# Patient Record
Sex: Female | Born: 1955
Health system: Southern US, Community
[De-identification: ages and names within clinical notes are randomized; demographics above are authoritative.]

## PROBLEM LIST (undated history)

## (undated) DIAGNOSIS — M1990 Unspecified osteoarthritis, unspecified site: Secondary | ICD-10-CM

## (undated) DIAGNOSIS — Z86718 Personal history of other venous thrombosis and embolism: Secondary | ICD-10-CM

## (undated) DIAGNOSIS — D649 Anemia, unspecified: Secondary | ICD-10-CM

## (undated) DIAGNOSIS — T7840XA Allergy, unspecified, initial encounter: Secondary | ICD-10-CM

## (undated) DIAGNOSIS — E785 Hyperlipidemia, unspecified: Secondary | ICD-10-CM

## (undated) DIAGNOSIS — I10 Essential (primary) hypertension: Secondary | ICD-10-CM

## (undated) HISTORY — PX: POLYPECTOMY: SHX149

## (undated) HISTORY — DX: Allergy, unspecified, initial encounter: T78.40XA

## (undated) HISTORY — DX: Hyperlipidemia, unspecified: E78.5

## (undated) HISTORY — DX: Unspecified osteoarthritis, unspecified site: M19.90

## (undated) HISTORY — DX: Essential (primary) hypertension: I10

## (undated) HISTORY — PX: COLONOSCOPY: SHX174

## (undated) HISTORY — DX: Anemia, unspecified: D64.9

## (undated) HISTORY — PX: OTHER SURGICAL HISTORY: SHX169

---

## 1991-04-04 DIAGNOSIS — Z86718 Personal history of other venous thrombosis and embolism: Secondary | ICD-10-CM

## 1991-04-04 HISTORY — DX: Personal history of other venous thrombosis and embolism: Z86.718

## 1998-01-06 ENCOUNTER — Ambulatory Visit (HOSPITAL_COMMUNITY): Admission: RE | Admit: 1998-01-06 | Discharge: 1998-01-06 | Payer: Self-pay | Admitting: Internal Medicine

## 1998-07-13 ENCOUNTER — Emergency Department (HOSPITAL_COMMUNITY): Admission: EM | Admit: 1998-07-13 | Discharge: 1998-07-13 | Payer: Self-pay | Admitting: Emergency Medicine

## 2000-06-06 ENCOUNTER — Emergency Department (HOSPITAL_COMMUNITY): Admission: EM | Admit: 2000-06-06 | Discharge: 2000-06-06 | Payer: Self-pay | Admitting: *Deleted

## 2002-10-20 ENCOUNTER — Ambulatory Visit (HOSPITAL_COMMUNITY): Admission: RE | Admit: 2002-10-20 | Discharge: 2002-10-20 | Payer: Self-pay | Admitting: Family Medicine

## 2003-05-25 ENCOUNTER — Emergency Department (HOSPITAL_COMMUNITY): Admission: EM | Admit: 2003-05-25 | Discharge: 2003-05-25 | Payer: Self-pay | Admitting: Emergency Medicine

## 2005-08-30 ENCOUNTER — Emergency Department (HOSPITAL_COMMUNITY): Admission: EM | Admit: 2005-08-30 | Discharge: 2005-08-30 | Payer: Self-pay | Admitting: Emergency Medicine

## 2005-08-30 ENCOUNTER — Ambulatory Visit (HOSPITAL_COMMUNITY): Admission: RE | Admit: 2005-08-30 | Discharge: 2005-08-30 | Payer: Self-pay | Admitting: Emergency Medicine

## 2005-08-30 ENCOUNTER — Encounter: Payer: Self-pay | Admitting: Vascular Surgery

## 2008-03-17 ENCOUNTER — Emergency Department (HOSPITAL_COMMUNITY): Admission: EM | Admit: 2008-03-17 | Discharge: 2008-03-17 | Payer: Self-pay | Admitting: Emergency Medicine

## 2012-06-06 ENCOUNTER — Emergency Department (INDEPENDENT_AMBULATORY_CARE_PROVIDER_SITE_OTHER)
Admission: EM | Admit: 2012-06-06 | Discharge: 2012-06-06 | Disposition: A | Discharge status: Home / Self Care | Payer: Self-pay | Source: Home / Self Care | Attending: Emergency Medicine | Admitting: Emergency Medicine

## 2012-06-06 ENCOUNTER — Observation Stay (HOSPITAL_COMMUNITY): Payer: Self-pay

## 2012-06-06 ENCOUNTER — Emergency Department (HOSPITAL_COMMUNITY): Payer: Self-pay

## 2012-06-06 ENCOUNTER — Observation Stay (HOSPITAL_COMMUNITY)
Admission: EM | Admit: 2012-06-06 | Discharge: 2012-06-07 | Disposition: A | Discharge status: Home / Self Care | Payer: Self-pay | Attending: Internal Medicine | Admitting: Internal Medicine

## 2012-06-06 ENCOUNTER — Encounter (HOSPITAL_COMMUNITY): Payer: Self-pay | Admitting: *Deleted

## 2012-06-06 ENCOUNTER — Encounter (HOSPITAL_COMMUNITY): Payer: Self-pay | Admitting: Emergency Medicine

## 2012-06-06 DIAGNOSIS — R0600 Dyspnea, unspecified: Secondary | ICD-10-CM

## 2012-06-06 DIAGNOSIS — J069 Acute upper respiratory infection, unspecified: Secondary | ICD-10-CM | POA: Insufficient documentation

## 2012-06-06 DIAGNOSIS — R9431 Abnormal electrocardiogram [ECG] [EKG]: Secondary | ICD-10-CM | POA: Diagnosis present

## 2012-06-06 DIAGNOSIS — I10 Essential (primary) hypertension: Secondary | ICD-10-CM

## 2012-06-06 DIAGNOSIS — R002 Palpitations: Secondary | ICD-10-CM

## 2012-06-06 DIAGNOSIS — R61 Generalized hyperhidrosis: Secondary | ICD-10-CM

## 2012-06-06 DIAGNOSIS — R42 Dizziness and giddiness: Secondary | ICD-10-CM

## 2012-06-06 DIAGNOSIS — I160 Hypertensive urgency: Secondary | ICD-10-CM | POA: Diagnosis present

## 2012-06-06 DIAGNOSIS — R531 Weakness: Secondary | ICD-10-CM

## 2012-06-06 DIAGNOSIS — E876 Hypokalemia: Secondary | ICD-10-CM | POA: Insufficient documentation

## 2012-06-06 DIAGNOSIS — F1410 Cocaine abuse, uncomplicated: Secondary | ICD-10-CM | POA: Insufficient documentation

## 2012-06-06 HISTORY — DX: Personal history of other venous thrombosis and embolism: Z86.718

## 2012-06-06 LAB — CBC WITH DIFFERENTIAL/PLATELET
Basophils Relative: 1 % (ref 0–1)
Eosinophils Absolute: 0.2 10*3/uL (ref 0.0–0.7)
HCT: 41.1 % (ref 36.0–46.0)
Hemoglobin: 13.9 g/dL (ref 12.0–15.0)
Lymphs Abs: 1.6 10*3/uL (ref 0.7–4.0)
MCH: 30.7 pg (ref 26.0–34.0)
MCHC: 33.8 g/dL (ref 30.0–36.0)
MCV: 90.7 fL (ref 78.0–100.0)
Monocytes Absolute: 0.3 10*3/uL (ref 0.1–1.0)
Monocytes Relative: 8 % (ref 3–12)
Neutrophils Relative %: 44 % (ref 43–77)
RBC: 4.53 MIL/uL (ref 3.87–5.11)

## 2012-06-06 LAB — PRO B NATRIURETIC PEPTIDE: Pro B Natriuretic peptide (BNP): 211.5 pg/mL — ABNORMAL HIGH (ref 0–125)

## 2012-06-06 LAB — POCT I-STAT, CHEM 8
BUN: 7 mg/dL (ref 6–23)
Chloride: 106 mEq/L (ref 96–112)
Creatinine, Ser: 0.7 mg/dL (ref 0.50–1.10)
Glucose, Bld: 91 mg/dL (ref 70–99)
Potassium: 4 mEq/L (ref 3.5–5.1)
Sodium: 140 mEq/L (ref 135–145)

## 2012-06-06 LAB — APTT: aPTT: 27 seconds (ref 24–37)

## 2012-06-06 LAB — LIPID PANEL
HDL: 47 mg/dL (ref >39)
Total CHOL/HDL Ratio: 6.9 RATIO
Triglycerides: 380 mg/dL — ABNORMAL HIGH (ref <150)

## 2012-06-06 LAB — COMPREHENSIVE METABOLIC PANEL
ALT: 19 U/L (ref 0–35)
AST: 20 U/L (ref 0–37)
Albumin: 3.8 g/dL (ref 3.5–5.2)
CO2: 24 mEq/L (ref 19–32)
Chloride: 104 mEq/L (ref 96–112)
Creatinine, Ser: 0.7 mg/dL (ref 0.50–1.10)
GFR calc non Af Amer: >90 mL/min (ref >90)
Potassium: 3.5 mEq/L (ref 3.5–5.1)
Sodium: 140 mEq/L (ref 135–145)
Total Bilirubin: 0.4 mg/dL (ref 0.3–1.2)

## 2012-06-06 LAB — POCT I-STAT TROPONIN I

## 2012-06-06 MED ORDER — ACETAMINOPHEN 650 MG RE SUPP: 650.0000 mg | Freq: Four times a day (QID) | RECTAL | Status: DC | PRN

## 2012-06-06 MED ORDER — SODIUM CHLORIDE 0.9 % IV SOLN
Freq: Once | INTRAVENOUS | Status: AC
  Administered 2012-06-06: 13:00:00 via INTRAVENOUS

## 2012-06-06 MED ORDER — SODIUM CHLORIDE 0.9 % IV SOLN: 250.0000 mL | INTRAVENOUS | Status: DC | PRN

## 2012-06-06 MED ORDER — LORATADINE 10 MG PO TABS
10.0000 mg | ORAL_TABLET | Freq: Every day | ORAL | Status: DC
  Administered 2012-06-06 – 2012-06-07 (×2): 10 mg via ORAL
  Filled 2012-06-06 (×2): qty 1

## 2012-06-06 MED ORDER — SODIUM CHLORIDE 0.9 % IV SOLN
INTRAVENOUS | Status: DC
  Administered 2012-06-06: 12:00:00 via INTRAVENOUS

## 2012-06-06 MED ORDER — SODIUM CHLORIDE 0.9 % IJ SOLN
3.0000 mL | Freq: Two times a day (BID) | INTRAMUSCULAR | Status: DC
  Administered 2012-06-06 – 2012-06-07 (×2): 3 mL via INTRAVENOUS

## 2012-06-06 MED ORDER — HYDROCHLOROTHIAZIDE 25 MG PO TABS
25.0000 mg | ORAL_TABLET | Freq: Every day | ORAL | Status: DC
  Administered 2012-06-06 – 2012-06-07 (×2): 25 mg via ORAL
  Filled 2012-06-06 (×2): qty 1

## 2012-06-06 MED ORDER — SODIUM CHLORIDE 0.9 % IJ SOLN: 3.0000 mL | Freq: Two times a day (BID) | INTRAMUSCULAR | Status: DC

## 2012-06-06 MED ORDER — LABETALOL HCL 5 MG/ML IV SOLN: 5.0000 mg | INTRAVENOUS | Status: DC | PRN

## 2012-06-06 MED ORDER — ACETAMINOPHEN 325 MG PO TABS
650.0000 mg | ORAL_TABLET | Freq: Four times a day (QID) | ORAL | Status: DC | PRN
  Administered 2012-06-06: 650 mg via ORAL
  Filled 2012-06-06: qty 2

## 2012-06-06 MED ORDER — SODIUM CHLORIDE 0.9 % IJ SOLN: 3.0000 mL | INTRAMUSCULAR | Status: DC | PRN

## 2012-06-06 MED ORDER — ENALAPRILAT 1.25 MG/ML IV SOLN
1.2500 mg | Freq: Once | INTRAVENOUS | Status: AC
  Administered 2012-06-06: 1.25 mg via INTRAVENOUS
  Filled 2012-06-06: qty 1

## 2012-06-06 MED ORDER — ENOXAPARIN SODIUM 40 MG/0.4ML ~~LOC~~ SOLN
40.0000 mg | SUBCUTANEOUS | Status: DC
  Administered 2012-06-06: 40 mg via SUBCUTANEOUS
  Filled 2012-06-06 (×2): qty 0.4

## 2012-06-06 MED ORDER — NICARDIPINE HCL IN NACL 20-0.86 MG/200ML-% IV SOLN
5.0000 mg/h | Freq: Once | INTRAVENOUS | Status: AC
  Administered 2012-06-06: 5 mg/h via INTRAVENOUS
  Filled 2012-06-06: qty 200

## 2012-06-06 NOTE — ED Notes (Signed)
Report called to [facility] charge nurse

## 2012-06-06 NOTE — ED Provider Notes (Signed)
History     CSN: [id number]  Arrival date & time 06/06/12  1152   First MD Initiated Contact with Patient 06/06/12 1154      Chief Complaint  Patient presents with  . Chest Pain  . Hypertension    (Consider location/radiation/quality/duration/timing/severity/associated sxs/prior treatment) HPI Comments: Pt from UC with c/o Htn.  She has had URI sx this week with sinus tenderness, nasal cong and drainage but no f/c.  She has occasional nausea but is able to eat and drink.  Sx are constant, gradually worsening, not associated with swelling, rashes, no CP, no cough, no fever, no diarrhea or dysuria.  She has no cardiac history.  Was seen at the [facility] and directed here for [facility] which was > 200 systolic and high diastolic.  She states that she has never been treated for any medical problems in the past and takes no Rx meds.  She has been using Ibuprofen but no other OTC meds for sinus fullness / pressure.    Patient is a 57 y.o. female presenting with chest pain and hypertension. The history is provided by the patient and a parent.  Chest Pain Hypertension Associated symptoms include chest pain.    Past Medical History  Diagnosis Date  . Hx of blood clots     History reviewed. No pertinent past surgical history.  No family history on file.  History  Substance Use Topics  . Smoking status: Never Smoker   . Smokeless tobacco: Not on file  . Alcohol Use: 1.2 oz/week    2 Cans of beer per week    OB History   Grav Para Term Preterm Abortions TAB SAB Ect Mult Living                  Review of Systems  Cardiovascular: Positive for chest pain.  All other systems reviewed and are negative.    Allergies  Review of patient's allergies indicates no known allergies.  Home Medications   Current Outpatient Rx  Name  Route  Sig  Dispense  Refill  . ibuprofen (ADVIL,MOTRIN) 200 MG tablet   Oral   Take 200 mg by mouth every 6 (six) hours as needed for pain, fever or headache.            BP 145/101  Pulse 84  Temp(Src) 97.7 F (36.5 C) (Oral)  Resp 24  SpO2 98%  Physical Exam  Nursing note and vitals reviewed. Constitutional: She appears well-developed and well-nourished. No distress.  HENT:  Head: Normocephalic and atraumatic.  Mouth/Throat: Oropharynx is clear and moist. No oropharyngeal exudate.  Eyes: Conjunctivae and EOM are normal. Pupils are equal, round, and reactive to light. Right eye exhibits no discharge. Left eye exhibits no discharge. No scleral icterus.  Neck: Normal range of motion. Neck supple. No JVD present. No thyromegaly present.  Cardiovascular: Normal rate, regular rhythm, normal heart sounds and intact distal pulses.  Exam reveals no gallop and no friction rub.   No murmur heard. Pulmonary/Chest: Effort normal and breath sounds normal. No respiratory distress. She has no wheezes. She has no rales.  Abdominal: Soft. Bowel sounds are normal. She exhibits no distension and no mass. There is no tenderness.  Musculoskeletal: Normal range of motion. She exhibits no edema and no tenderness.  Lymphadenopathy:    She has no cervical adenopathy.  Neurological: She is alert. Coordination normal.  Skin: Skin is warm and dry. No rash noted. No erythema.  Psychiatric: She has a normal mood and  affect. Her behavior is normal.    ED Course  Procedures (including critical care time)  Labs Reviewed  APTT  PROTIME-INR  CBC WITH DIFFERENTIAL  POCT I-STAT, CHEM 8   Dg Chest Port 1 View  06/06/2012  *RADIOLOGY REPORT*  Clinical Data: Right-sided chest pain and shortness of breath.  PORTABLE CHEST - 1 VIEW  Comparison: Chest x-ray 03/17/2008.  Findings: Lung volumes are normal.  No consolidative airspace disease.  No pleural effusions.  Mild cephalization of the pulmonary vasculature, without frank pulmonary edema.  Mild cardiomegaly has increased compared to the prior study.  Upper mediastinal contours are within normal limits.  IMPRESSION: 1.   Interval development of mild cardiomegaly with pulmonary venous congestion.   Original Report Authenticated By: Trudie Reed, M.D.      1. Hypertension       MDM  The patient has normal vital signs other than significant hypertension, she will require treatment of his blood pressure as we evaluate her for her palpitations and generalized weakness with upper respiratory symptoms. Again she denies chest pain, has had no vomiting, no diaphoresis, no back pain. I doubt that this is cardiac in nature though she does have hypertension that requires treatment. Labs have been drawn, EKG is unremarkable and unchanged from the nursing care. 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 Workup to ensue.  The patient is not have any ectopy on my exam in order she have any ectopy on her EKG, source of palpitations unclear  ED ECG REPORT  I personally interpreted this EKG   Date: 06/06/2012   Rate: 65  Rhythm: normal sinus rhythm  QRS Axis: left  Intervals: normal  ST/T Wave abnormalities: normal  Conduction Disutrbances:left anterior fascicular block  Narrative Interpretation:   Old EKG Reviewed: Compared with urgent care EKG from March 6 at 10:58 AM, no changes   The patient has been reevaluated, she has persistent significant hypertension, last blood pressure was 195/127 prior to starting Cardene. After Cardizem drip was initiated blood pressure has improved significantly down to 145/100. I have discussed the patient's care with the internal medicine resident who will admit the patient for ongoing treatment of her hypertension.  At this point blood work unremarkable including renal function, PT INR and a chest x-ray with pulmonary venous congestion but no significant bony edema   Vida Roller, MD 06/06/12 1435

## 2012-06-06 NOTE — ED Notes (Signed)
Per EMS - pt coming from urgent care, pt reports she feels her heart fluttering and generalized weakness X few weeks. Pt denies CP/n/v/d/HA. Pt send to ED for further eval because she was hypertensive, at urgent care 204/121. EMS BP 224/120 HR 90. Urgent care started a 20G in right wrist and placed on 2L/min O2.

## 2012-06-06 NOTE — ED Notes (Signed)
No [facility] truck available - [facility] called

## 2012-06-06 NOTE — ED Notes (Signed)
Pt reports a week ago while she was working she started to feel a little dizzy and felt like her heart was fluttering. Pt reports over the past week when she stands up she feels the fluttering feeling, reports it lasts for a few seconds and sometimes returns with walking. Pt denies CP. Pt c/o right lower back/hip pain that also started about a week ago, rates pain at 4/10, pt has taken ibuprofen but didn't get much relief, didn't take anything today. Pt also c/o slight HA but reports it really hasn't bothered her. Skin warm and dry, resp e/u. Pt in nad.

## 2012-06-06 NOTE — ED Notes (Signed)
Pt reports "heart fluttering for the past week with headaches, shortness of breath, and vision changes" admits to drinking on average 2 beers a day

## 2012-06-06 NOTE — ED Notes (Signed)
Pt returned from MRI, will be taken to [facility]W now.

## 2012-06-06 NOTE — H&P (Signed)
Hospital Admission Note Date: 06/06/2012  Patient name: [name] Medical record number: [id number] Date of birth: Mar 04, 1956 Age: 57 y.o. Gender: female PCP: None former health serv   Medical Service: Internal Medicine Attending physician: Dr. Dalphine Handing    1st Contact: Dr. Shirlee Latch Pager: [phone] 2nd Contact: Dr. Everardo Beals Pager:[phone] After 5 pm or weekends: 1st Contact:  Pager: [phone] 2nd Contact:  Pager: [phone]  Chief Complaint: elevated blood pressure   History of Present Illness: 57 y.o PMH DVT presented to Urgent Care with one week history of URI symptoms (sinus tenderness, sinus problems, nasal congestion and drainage, subjective fever, denies chills).  She was taking an over the counter medication without relief (possibly allergy pills).  Incidentally at Urgent Care she was found to have elevated blood pressure (204/121).  EKG with left atrial enlargement.  She was sent from Urgent Care to the ED for further work up. 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  9 days prior to admission are onset of symptoms (most ROS unless otherwise specified).       Meds: Medications Prior to Admission  Medication Sig Dispense Refill  . ibuprofen (ADVIL,MOTRIN) 200 MG tablet Take 200 mg by mouth every 6 (six) hours as needed for pain, fever or headache.        Allergies: Allergies as of 06/06/2012  . (No Known Allergies)   Past Medical History  Diagnosis Date  . Hx of blood clots 1993     previously on Coumadin x 3 months in the past    Past surgical history-denies   Family history-denies   History   Social History  . Marital Status: Single    Spouse Name: N/A    Number of Children: N/A  . Years of Education: N/A   Occupational History  . Not on file.   Social History Main Topics  . Smoking status: Never Smoker   . Smokeless tobacco: Not on file  . Alcohol Use: 1.2 oz/week    2 Cans of beer per week  . Drug Use: No  . Sexually Active: Not Currently   Other Topics Concern  . Not on file   Social History Narrative  . No narrative on file   Social History:  5 kids Works at [employer] difficulties  Drinks beer 3x per week 24 oz of beer. Denies other drugs or cigarettes  No PCP currently   Review of Systems: General: +subjective fever, denies chills, +rhinorrhea, +sinus congestion, +generalized weakness, +fatigue, +sweating HEENT: resolved h/a CV: +palpitations x 2 weeks, denies chest pain  Lungs: +sob with exertion; +cough with blood x 1 last week Abdomen: +nausea after eating, denies abdominal pain GU: denies dysuria MSK: +right lower back pain x 2 weeks  Extremities: denies lower extremity swelling Neuro: +lightheadedness, +blurry vision and double vision, +dizziness x 2 weeks, resolved h/a (bitemporal), denies falls  Physical Exam: Blood pressure 178/108, pulse 64, temperature 97.9 F (36.6 C), temperature source Oral, resp. rate 18, SpO2 100.00%.  VS 87/100%/18/137/85; [id number]  General: lying in bed, nad, alert and oriented x 3  HEENT: [place]/at,  perrl b/l CV: RRR no rubs, murmurs or gallops Lungs: ctab Abdomen: soft, obese, ntnd Extremities: warm, no cyanosis or edema  Neuro: CN 2-12 grossly intact, 5/5 motor strength all 4 extremities, intact finger to nose those had double vision, negative rhomberg, intact heal to shin, wnl reflexes patella and BR  Lab results: Basic Metabolic Panel:  Recent Labs  65/78/46 1313  NA 140   K 4.0  CL 106  GLUCOSE 91  BUN 7  CREATININE 0.70   CBC:  Recent Labs  06/06/12 1313 06/06/12 1401  WBC  --  3.8*  NEUTROABS  --  1.7  HGB 15.0 13.9  HCT 44.0 41.1  MCV  --  90.7  PLT  --  174   Coagulation:  Recent Labs  06/06/12 1248  LABPROT 13.1  INR 1.00   Urine Drug Screen: Drugs of Abuse  No results found for this basename: labopia,  cocainscrnur,  labbenz,  amphetmu,  thcu,  labbarb     Misc. Labs: UDS Cardiac enzymes  proBNP Hemoglobin A1C   Imaging results:  [name] Contrast  06/06/2012  *RADIOLOGY REPORT*  Clinical Data: Weakness and dizziness.  Heart palpitations. Question posterior circulation infarct.  MRI HEAD WITHOUT CONTRAST  Technique:  Multiplanar, multiecho pulse sequences of the brain and surrounding structures were obtained according to standard protocol without intravenous contrast.  Comparison: None.  Findings: The diffusion weighted images demonstrate no evidence for acute or subacute infarction.  Incidental note is made of a persistent cavum septum pellucidum et vergi.  Scattered periventricular subcortical T2 and FLAIR hyperintensities are greater than expected for age.  No hemorrhage or mass lesion is present.  Flow is present in the major intracranial arteries.  The globes and orbits are intact.  Mild circumferential mucosal thickening is present bilaterally in the maxillary sinuses. Minimal mucosal thickening is noted in the ethmoid air cells bilaterally.  The mastoid air cells are clear.  IMPRESSION:  1.  Scattered periventricular subcortical T2 and FLAIR hyperintensities are greater than expected for age. The finding is nonspecific but can be seen in the setting of chronic microvascular ischemia, a demyelinating process such as multiple sclerosis, vasculitis, complicated migraine headaches, or as the sequelae of a prior infectious or inflammatory process. 2.  Mild maxillary and ethmoid sinus disease.   Original Report Authenticated By:  Marin Roberts, M.D.    Dg Chest Port 1 View  06/06/2012  *RADIOLOGY REPORT*  Clinical Data: Right-sided chest pain and shortness of breath.  PORTABLE CHEST - 1 VIEW  Comparison: Chest x-ray 03/17/2008.  Findings: Lung volumes are normal.  No consolidative airspace disease.  No pleural effusions.  Mild cephalization of the pulmonary vasculature, without frank pulmonary edema.  Mild cardiomegaly has increased compared to the prior study.  Upper mediastinal contours are within normal limits.  IMPRESSION: 1.  Interval development of mild cardiomegaly with pulmonary venous congestion.   Original Report Authenticated By: Trudie Reed, M.D.     Other results: EKG: rate 66.  normal sinus rhythm with artifact, normal intervals, borderline LAD, no acute ST changes, no LVH  Assessment & Plan by Problem: 57 yo woman without known significant past medical history who presented with URI and found to be hypertensive on 06/06/12 204/121 at Urgent Care.    1. Concern for Hypertensive Urgency -Blood pressure not well controlled in the ED on Cardene gtt and with Vasotec 1.25 mg x 1 -On admission systolic pressures down to 130s-140s.   -etiology may be her acute  illness.  No signs of end organ damage (nonfocal neuro exam, normal Cr function). Patient likely has longstanding HTN and has not seen a PCP in years.  -Start HCTZ 25mg  daily, Labetalol 5mg  prn BP>200/100  -Risk Stratification: HbA1c, Lipid profile  -Check pBNP, CXR suggests pulmonary venous congestion, trend troponin, UDS, pending echo, CMET in the am to monitor renal and liver function -monitor via tele, pending orthostatics  -spoke with Dr. Roseanne Reno (Neurology) on call he recommended MRI w/o contrast to w/u for posterior circulation infarction  2. Acute URI -Most likely viral etiology, especially in setting of mild leukopenia -Will hold antibiotics for now and monitor. Claritin.  For nasal congestion can't afford Flonase so will not start   -Patient has been afebrile.  -AM CBC   3. QT prolongation  -QTC 482  -repeat EKG in the am   4. DVT px  -Lovenox  5. F/E/N -NS KVO -will monitor electrolytes and replace as needed  -carb mod diet    Dispo: Disposition is deferred at this time, awaiting improvement of current medical problems. Anticipated discharge in approximately 2-3 day(s).   The patient does not have a current PCP therefore will be requiring OPC follow-up after discharge.   The patient does not have transportation limitations that hinder transportation to clinic appointments.  SignedAnnett Gula [phone] 06/06/2012, 7:47 PM

## 2012-06-06 NOTE — ED Provider Notes (Signed)
Chief Complaint  Patient presents with  . Chest Pain    History of Present Illness:   [name] is a 57 year old female who's had mild upper respiratory symptoms for the past week. Also for the past week she has noted heart palpitations with a sensation of fluttering, generalized weakness, malaise, and fatigue, shortness of breath, sweats, dizziness, and poor vision. She's had some moderate headaches. She denies any other focal neurological symptoms. She has no anterior chest pain. She has had some mid back pain. She denies any cardiac history or history of high blood pressure. She has not seen a physician in years and does not have a primary care Dr. She takes no prescription medication. She drinks an occasional beer. She's not a smoker.  Review of Systems:  Other than noted above, the patient denies any of the following symptoms. Systemic:  No fever, chills, or fatigue. Pulmonary:  No cough, wheezing, shortness of breath. Cardiac:  No chest pain, tightness, pressure, dizziness, presyncope, syncope, PND, orthopnea, or edema. Ext:  No leg pain or swelling. Neuro:  No weakness, paresthesias, or difficulty with speech or gait. Psych:  No anxiety or depression. Endo:  No weight loss, tremor, sweats, or heat intolerance.   PMFSH:  Past medical history, family history, social history, meds, and allergies were reviewed and updated as needed. No history of cardiac disease.  No history of excessive alcohol intake.  Physical Exam:   Vital signs:  BP 204/121  Pulse 73  Temp(Src) 97.5 F (36.4 C) (Oral)  Resp 18  SpO2 100% Gen:  Alert, oriented, in no distress, skin warm and dry. Eye:  PERRL, lids and conjunctivas normal.  No stare or lid lag. ENT:  Mucous membranes moist, pharynx clear. Neck:  Supple, no adenopathy or tenderness.  No JVD.  Thyroid not enlarged. Lungs:  Clear to auscultation, no wheezes, rales or rhonchi.  No respiratory distress. Heart:  Regular rhythm, no extrasystoles.  No  gallops, murmers, clicks or rubs. Abdomen:  Soft, nontender, no organomegaly or mass.  Bowel sounds normal.  No pulsatile abdominal mass or bruit. Ext:  No edema. Pulses full and equal. Skin:  Warm and dry.  No rash.  EKG:   Date: 06/06/2012  Rate: 65  Rhythm: normal sinus rhythm  QRS Axis: normal  Intervals: normal  ST/T Wave abnormalities: normal  Conduction Disutrbances:none  Narrative Interpretation: Normal sinus rhythm, possible left atrial enlargement, otherwise normal.  Old EKG Reviewed: none available  Course in Urgent Care Center:   IV normal saline was started at 50 mL per hour, she was begun on nasal oxygen and monitored.  Assessment:  The primary encounter diagnosis was Palpitations. Diagnoses of Weakness, Dyspnea, Diaphoresis, Dizziness, and Hypertension were also pertinent to this visit.  My concern is that her symptoms could be anginal equivalents especially with shortness of breath and the diaphoresis. Additionally her blood pressure is markedly elevated and this only to be controlled prior to sending her home. It is for this reason I have elected to send her to the emergency department via [facility].  Plan:   1.  The following meds were prescribed:   New Prescriptions   No medications on file   2.  The patient was transferred to the emergency department via [facility] in stable condition.  Medical Decision Making:  57 year old female presents with a 1 week history of URI symptoms, heart palpitations, generalized weakness, shortness of breath, sweats, dizziness, poor vision, and headache.  Her BP was markedly elevated  at 204/121.  Her EKG was WNL except for left atrial enlargement.  I am concerned that this may be an anginal equivalent and feel she needs further cardiac workup in the ED.      Reuben Likes, MD 06/06/12 1128

## 2012-06-07 DIAGNOSIS — J069 Acute upper respiratory infection, unspecified: Secondary | ICD-10-CM

## 2012-06-07 DIAGNOSIS — F1410 Cocaine abuse, uncomplicated: Secondary | ICD-10-CM | POA: Diagnosis present

## 2012-06-07 DIAGNOSIS — I369 Nonrheumatic tricuspid valve disorder, unspecified: Secondary | ICD-10-CM

## 2012-06-07 LAB — HEMOGLOBIN A1C: Mean Plasma Glucose: 117 mg/dL — ABNORMAL HIGH (ref <117)

## 2012-06-07 LAB — RAPID URINE DRUG SCREEN, HOSP PERFORMED
Amphetamines: NOT DETECTED
Barbiturates: NOT DETECTED
Tetrahydrocannabinol: NOT DETECTED

## 2012-06-07 LAB — CBC
HCT: 41.8 % (ref 36.0–46.0)
Hemoglobin: 14.5 g/dL (ref 12.0–15.0)
MCH: 31.2 pg (ref 26.0–34.0)
MCHC: 34.7 g/dL (ref 30.0–36.0)
RDW: 12.5 % (ref 11.5–15.5)

## 2012-06-07 LAB — BASIC METABOLIC PANEL
BUN: 10 mg/dL (ref 6–23)
Chloride: 98 mEq/L (ref 96–112)
Creatinine, Ser: 0.68 mg/dL (ref 0.50–1.10)
GFR calc Af Amer: >90 mL/min (ref >90)
GFR calc non Af Amer: >90 mL/min (ref >90)
Glucose, Bld: 96 mg/dL (ref 70–99)
Potassium: 3.4 mEq/L — ABNORMAL LOW (ref 3.5–5.1)

## 2012-06-07 MED ORDER — SALINE SPRAY 0.65 % NA SOLN
1.0000 | NASAL | Status: DC | PRN
  Filled 2012-06-07: qty 44

## 2012-06-07 MED ORDER — LISINOPRIL-HYDROCHLOROTHIAZIDE 20-25 MG PO TABS: 1.0000 | ORAL_TABLET | Freq: Every day | ORAL | Status: DC

## 2012-06-07 MED ORDER — PRAVASTATIN SODIUM 40 MG PO TABS: 40.0000 mg | ORAL_TABLET | Freq: Every day | ORAL | Status: DC

## 2012-06-07 MED ORDER — LISINOPRIL 20 MG PO TABS
20.0000 mg | ORAL_TABLET | Freq: Every day | ORAL | Status: DC
  Administered 2012-06-07: 20 mg via ORAL
  Filled 2012-06-07: qty 1

## 2012-06-07 MED ORDER — SIMVASTATIN 20 MG PO TABS
20.0000 mg | ORAL_TABLET | Freq: Every day | ORAL | Status: DC
  Filled 2012-06-07: qty 1

## 2012-06-07 MED ORDER — POTASSIUM CHLORIDE CRYS ER 20 MEQ PO TBCR
40.0000 meq | EXTENDED_RELEASE_TABLET | Freq: Once | ORAL | Status: AC
  Administered 2012-06-07: 40 meq via ORAL
  Filled 2012-06-07: qty 2

## 2012-06-07 NOTE — Progress Notes (Signed)
Utilization review completed. Bertha Stanfill, RN, BSN. 

## 2012-06-07 NOTE — Care Management Note (Signed)
   CARE MANAGEMENT NOTE 06/07/2012  Patient:  [name], [name]   Account Number:  [id number]  Date Initiated:  06/07/2012  Documentation initiated by:  Johny Shock  Subjective/Objective Assessment:   Calll from pt RN re possible assistance with medications.     Action/Plan:   Pt meds are $4 at [facility] and pt has been filling prescriptions at that provider. MATCH program would also require $3 copay. Therefore pt advise to continue current plan as MATCH program may be needed later for more expensive medications.   Anticipated DC Date:  06/07/2012   Anticipated DC Plan:  HOME/SELF CARE         Choice offered to / List presented to:             Status of service:  Completed, signed off Medicare Important Message given?   (If response is "NO", the following Medicare IM given date fields will be blank) Date Medicare IM given:   Date Additional Medicare IM given:    Discharge Disposition:  HOME/SELF CARE  Per UR Regulation:    If discussed at Long Length of Stay Meetings, dates discussed:    Comments:

## 2012-06-07 NOTE — H&P (Addendum)
Internal Medicine Teaching Service Attending Note Date: 06/07/2012  Patient name: [name]  Medical record number: [id number]  Date of birth: 26-Oct-1955   History: The patient, [name], is a 57 y.o. year old female, with past medical history of blood clots, who denies having any medical problems otherwise, who comes in with the chief  complaint of elevated blood pressure. I have read the history documented by Dr.McLean and I concur with the chronology of events.   When I met with the patient today, she appeared to have greatly improved from last night. She denied dizziness, fatigue, headache, palpitations, chest pain, shortness of breath, sweating, or vision problems. She felt generally well.   [name] medical history, social history and medications have been reviewed.   Review of systems as per HPI and resident note.   Vitals:   06/07/12 0948 06/07/12 1100 06/07/12 1101 06/07/12 1102  BP: 150/89 117/78 66/37 146/102  Pulse: 79 75 89 92   Exam:  I met with patient around 9 am today  General: Moving around in the room comfortably.  HEENT: PERRL, EOMI, no scleral icterus. Heart: RRR, no rubs, murmurs or gallops. Lungs: Clear to auscultation bilaterally, no wheezes, rales, or rhonchi. Abdomen: Soft, nontender, nondistended, BS present. Extremities: Warm, no pedal edema. Neuro: Alert and oriented X3, cranial nerves II-XII grossly intact,  strength and sensation to light touch equal in bilateral upper and lower extremities  Labs:  CMP   Recent Labs Lab 06/06/12 1313 06/06/12 2006 06/07/12 0655  NA 140 140 137  K 4.0 3.5 3.4*  CL 106 104 98  CO2  --  24 27  GLUCOSE 91 121* 96  BUN 7 8 10   CREATININE 0.70 0.70 0.68  CALCIUM  --  9.1 9.3    Recent Labs Lab 06/06/12 1248 06/06/12 2006  AST  --  20  ALT  --  19  ALKPHOS  --  52  BILITOT  --  0.4  PROT  --  7.9  ALBUMIN  --  3.8  INR 1.00  --    CBC  Recent Labs Lab 06/06/12 1313 06/06/12 1401 06/07/12 0655   HGB 15.0 13.9 14.5  HCT 44.0 41.1 41.8  WBC  --  3.8* 4.4  PLT  --  174 181    Recent Labs Lab 06/06/12 2006 06/07/12 0035 06/07/12 0655  TROPONINI <0.30 <0.30 <0.30    Recent Labs Lab 06/06/12 2006  PROBNP 211.5*   Drugs of Abuse     Component Value Date/Time   LABOPIA NONE DETECTED 06/07/2012 1100   COCAINSCRNUR POSITIVE* 06/07/2012 1100   LABBENZ NONE DETECTED 06/07/2012 1100   AMPHETMU NONE DETECTED 06/07/2012 1100   THCU NONE DETECTED 06/07/2012 1100   LABBARB NONE DETECTED 06/07/2012 1100    Imaging: [name] Contrast  06/06/2012  *RADIOLOGY REPORT*  Clinical Data: Weakness and dizziness.  Heart palpitations. Question posterior circulation infarct.  MRI HEAD WITHOUT CONTRAST  Technique:  Multiplanar, multiecho pulse sequences of the brain and surrounding structures were obtained according to standard protocol without intravenous contrast.  Comparison: None.  Findings: The diffusion weighted images demonstrate no evidence for acute or subacute infarction.  Incidental note is made of a persistent cavum septum pellucidum et vergi.  Scattered periventricular subcortical T2 and FLAIR hyperintensities are greater than expected for age.  No hemorrhage or mass lesion is present.  Flow is present in the major intracranial arteries.  The globes and orbits are intact.  Mild circumferential mucosal thickening is  present bilaterally in the maxillary sinuses. Minimal mucosal thickening is noted in the ethmoid air cells bilaterally.  The mastoid air cells are clear.  IMPRESSION:  1.  Scattered periventricular subcortical T2 and FLAIR hyperintensities are greater than expected for age. The finding is nonspecific but can be seen in the setting of chronic microvascular ischemia, a demyelinating process such as multiple sclerosis, vasculitis, complicated migraine headaches, or as the sequelae of a prior infectious or inflammatory process. 2.  Mild maxillary and ethmoid sinus disease.   Original Report  Authenticated By: Marin Roberts, M.D.    Dg Chest Port 1 View  06/06/2012  *RADIOLOGY REPORT*  Clinical Data: Right-sided chest pain and shortness of breath.  PORTABLE CHEST - 1 VIEW  Comparison: Chest x-ray 03/17/2008.  Findings: Lung volumes are normal.  No consolidative airspace disease.  No pleural effusions.  Mild cephalization of the pulmonary vasculature, without frank pulmonary edema.  Mild cardiomegaly has increased compared to the prior study.  Upper mediastinal contours are within normal limits.  IMPRESSION: 1.  Interval development of mild cardiomegaly with pulmonary venous congestion.   Original Report Authenticated By: Trudie Reed, M.D.    EKG 06/07/12 12:02 PM: NSR at 68  ECHO: Study Conclusions  - Left ventricle: Inferobasal hypokinesis The cavity size was mildly dilated. There was moderate concentric hypertrophy. Systolic function was normal. The estimated ejection fraction was in the range of 50% to 55%. - Left atrium: The atrium was mildly dilated. - Atrial septum: No defect or patent foramen ovale was identified.    ASSESSMENT AND PLAN   This is a 57 y.o. year old female with the following problems: 1- Hypertensive Urgency probably secondary to cocaine use 2- Hypokalemia 3- No insurance and no PCP 4- Drug use 5- QT prolongation, resolved.  I agree with the plan of the resident team with the following additions/thoughts:  I think the patient is doing well on HCTZ and the IV meds administered yesterday. However, we need to start the patient on at least 2 anti-hypertensive medications at this point in time. We will start Lisinopril 20 mg, this morning (as already discussed in rounds) and see how the patient does on this combination, so she could be discharged on it. The Lisinopril-HCTZ combination for this strength is a $4 medication in [facility]. Patient will be counseled regarding cocaine use. A1c is 5.7, so no diabetes so far. We will try to get her in [facility] for regular follow up as outpatient. For QTc prolongation, we followed her and her last EKG was normal. ECHO does not reveal any alarming findings in the setting of the current situation. Cardiac enzymes are negative.    Other chronic issues per resident note.    Thanks, Aletta Edouard, MD 3/7/201412:26 PM

## 2012-06-07 NOTE — Progress Notes (Addendum)
Subjective: Pt denies dizziness or lightheadedness.  She had a h/a last night which Tylenol helped.  Denies chest pain, denies abdominal pain.  She ate this am and felt nauseated the nausea now resolved.   Objective: Vital signs in last 24 hours: Filed Vitals:   06/06/12 2106 06/06/12 2109 06/07/12 0614 06/07/12 0948  BP: 160/109 174/117 142/92 150/89  Pulse: 77 78 67 79  Temp:   98.4 F (36.9 C)   TempSrc:   Oral   Resp: 20 20 20    Weight:   197 lb (89.359 kg)   SpO2: 97% 99% 99%    Weight change:  No intake or output data in the 24 hours ending 06/07/12 1003  General: lying in bed, nad, alert and oriented x 3  HEENT: [place]/at CV: RRR no rubs, murmurs or gallops  Lungs: ctab  Abdomen: soft, obese, ntnd  Extremities: warm, no cyanosis or edema  Neuro: CN 2-12 grossly intact, 5/5 motor strength all 4 extremities  Lab Results: Basic Metabolic Panel:  Recent Labs Lab 06/06/12 2006 06/07/12 0655  NA 140 137  K 3.5 3.4*  CL 104 98  CO2 24 27  GLUCOSE 121* 96  BUN 8 10  CREATININE 0.70 0.68  CALCIUM 9.1 9.3   Liver Function Tests:  Recent Labs Lab 06/06/12 2006  AST 20  ALT 19  ALKPHOS 52  BILITOT 0.4  PROT 7.9  ALBUMIN 3.8   CBC:  Recent Labs Lab 06/06/12 1313 06/06/12 1401 06/07/12 0655  WBC  --  3.8* 4.4  NEUTROABS  --  1.7  --   HGB 15.0 13.9 14.5  HCT 44.0 41.1 41.8  MCV  --  90.7 89.9  PLT  --  174 181   Cardiac Enzymes:  Recent Labs Lab 06/06/12 2006 06/07/12 0035 06/07/12 0655  TROPONINI <0.30 <0.30 <0.30   BNP:  Recent Labs Lab 06/06/12 2006  PROBNP 211.5*   Hemoglobin A1C:  Recent Labs Lab 06/06/12 2006  HGBA1C 5.7*   Fasting Lipid Panel:  Recent Labs Lab 06/06/12 2005  CHOL 322*  HDL 47  LDLCALC 199*  TRIG 380*  CHOLHDL 6.9   Coagulation:  Recent Labs Lab 06/06/12 1248  LABPROT 13.1  INR 1.00   Urine Drug Screen: Drugs of Abuse  No results found for this basename: labopia,  cocainscrnur,  labbenz,   amphetmu,  thcu,  labbarb    Misc. Labs: None   Studies/Results: [name] Contrast  06/06/2012  *RADIOLOGY REPORT*  Clinical Data: Weakness and dizziness.  Heart palpitations. Question posterior circulation infarct.  MRI HEAD WITHOUT CONTRAST  Technique:  Multiplanar, multiecho pulse sequences of the brain and surrounding structures were obtained according to standard protocol without intravenous contrast.  Comparison: None.  Findings: The diffusion weighted images demonstrate no evidence for acute or subacute infarction.  Incidental note is made of a persistent cavum septum pellucidum et vergi.  Scattered periventricular subcortical T2 and FLAIR hyperintensities are greater than expected for age.  No hemorrhage or mass lesion is present.  Flow is present in the major intracranial arteries.  The globes and orbits are intact.  Mild circumferential mucosal thickening is present bilaterally in the maxillary sinuses. Minimal mucosal thickening is noted in the ethmoid air cells bilaterally.  The mastoid air cells are clear.  IMPRESSION:  1.  Scattered periventricular subcortical T2 and FLAIR hyperintensities are greater than expected for age. The finding is nonspecific but can be seen in the setting of chronic microvascular ischemia, a demyelinating  process such as multiple sclerosis, vasculitis, complicated migraine headaches, or as the sequelae of a prior infectious or inflammatory process. 2.  Mild maxillary and ethmoid sinus disease.   Original Report Authenticated By: Marin Roberts, M.D.    Dg Chest Port 1 View  06/06/2012  *RADIOLOGY REPORT*  Clinical Data: Right-sided chest pain and shortness of breath.  PORTABLE CHEST - 1 VIEW  Comparison: Chest x-ray 03/17/2008.  Findings: Lung volumes are normal.  No consolidative airspace disease.  No pleural effusions.  Mild cephalization of the pulmonary vasculature, without frank pulmonary edema.  Mild cardiomegaly has increased compared to the prior study.   Upper mediastinal contours are within normal limits.  IMPRESSION: 1.  Interval development of mild cardiomegaly with pulmonary venous congestion.   Original Report Authenticated By: Trudie Reed, M.D.    Medications:  Scheduled Meds: . enoxaparin (LOVENOX) injection  40 mg Subcutaneous Q24H  . hydrochlorothiazide  25 mg Oral Daily  . loratadine  10 mg Oral Daily  . potassium chloride  40 mEq Oral Once  . simvastatin  20 mg Oral q1800  . sodium chloride  3 mL Intravenous Q12H  . sodium chloride  3 mL Intravenous Q12H   Continuous Infusions:  PRN Meds:.sodium chloride, acetaminophen, acetaminophen, labetalol, sodium chloride, sodium chloride  3/7 echo Study Conclusions - Left ventricle: Inferobasal hypokinesis The cavity size was mildly dilated. There was moderate concentric hypertrophy. Systolic function was normal. The estimated ejection fraction was in the range of 50% to 55%. - Left atrium: The atrium was mildly dilated. - Atrial septum: No defect or patent foramen ovale was identified.    Assessment/Plan: 57 yo woman without known significant past medical history who presented with URI and found to be hypertensive on 06/06/12 204/121 at Urgent Care.   1. Concern for Hypertensive Urgency  -blood pressure trending down 142/92 this am  -Started HCTZ 25mg  daily, Added Lisinopril 10 mg Labetalol 5mg  prn BP>200/100  -Risk Stratification: HbA1c 5.7 %, Lipid profile (TC 322, TG 380, HDL 47, LDL 199) -will add Pravastatin 40 mg at discharge and Zocor 20 mg today.  Consider diet and exercise modifications as well  -elevated pBNP 211.5 -see echo above -trend troponin (negative x 3), UDS -monitor via tele, pending orthostatics    2. Acute URI  -Most likely viral etiology, especially in setting of mild leukopenia  -Will hold antibiotics for now and monitor. Claritin. For nasal congestion can't afford Flonase so will not start -Rx [facility]  -Patient has been afebrile.   3. QT  prolongation  -QTC 482-->448  4. DVT px  -Lovenox   5. Cocaine abuse -may have contributed to her elevated BP acutely -given information about cessation   6. F/E/N  -NS KVO  -will monitor electrolytes and replace as needed (HypoK 3.4 replaced with K dur 40 meQ x 1)  -carb mod diet   Dispo: possibly today with outpatient f/u with [facility] in 1 week (new patient)   The patient does not have a current PCP therefore will be requiring [facility]PC follow-up after discharge.  The patient does not have transportation limitations that hinder transportation to clinic appointments.    .Services Needed at time of discharge: Y = Yes, Blank = No PT:   OT:   RN:   Equipment:   Other:     LOS: 1 day   Annett Gula [phone]  06/07/2012, 10:03 AM

## 2012-06-07 NOTE — Discharge Summary (Signed)
Internal Medicine Teaching [facility] Discharge Note  Name: [name] MRN: [id number] DOB: 03/11/1956 57 y.o.  Date of Admission: 06/06/2012 11:53 AM Date of Discharge: 06/07/2012 Attending Physician: Aletta Edouard, MD  Discharge Diagnosis: 1. Hypertensive urgency 2. Cocaine abuse 3. Resolved QT prolongation  4. Acute upper respiratory infection 5. Hypokalemia    Discharge Medications:   Medication List    TAKE these medications       ibuprofen 200 MG tablet  Commonly known as:  ADVIL,MOTRIN  Take 200 mg by mouth every 6 (six) hours as needed for pain, fever or headache.     lisinopril-hydrochlorothiazide 20-25 MG per tablet  Commonly known as:  PRINZIDE,ZESTORETIC  Take 1 tablet by mouth daily.     pravastatin 40 MG tablet  Commonly known as:  PRAVACHOL  Take 1 tablet (40 mg total) by mouth daily.        Disposition and follow-up:   [name] was discharged from [facility] in stable condition.  At the hospital follow up visit please address  1) Repeat BMET in 1 week  2) Ask about compliance with medications 3) Ask about substance abuse  4) Ask if needs social work help   Follow-up Appointments: Internal Medicine Clinic will call with an appointment in the future and to get social work help for orange card.     Consultations: none  Procedures Performed:  [name] Contrast  06/06/2012  *RADIOLOGY REPORT*  Clinical Data: Weakness and dizziness.  Heart palpitations. Question posterior circulation infarct.  MRI HEAD WITHOUT CONTRAST  Technique:  Multiplanar, multiecho pulse sequences of the brain and surrounding structures were obtained according to standard protocol without intravenous contrast.  Comparison: None.  Findings: The diffusion weighted images demonstrate no evidence for acute or subacute infarction.  Incidental note is made of a persistent cavum septum pellucidum et vergi.  Scattered periventricular subcortical T2 and FLAIR  hyperintensities are greater than expected for age.  No hemorrhage or mass lesion is present.  Flow is present in the major intracranial arteries.  The globes and orbits are intact.  Mild circumferential mucosal thickening is present bilaterally in the maxillary sinuses. Minimal mucosal thickening is noted in the ethmoid air cells bilaterally.  The mastoid air cells are clear.  IMPRESSION:  1.  Scattered periventricular subcortical T2 and FLAIR hyperintensities are greater than expected for age. The finding is nonspecific but can be seen in the setting of chronic microvascular ischemia, a demyelinating process such as multiple sclerosis, vasculitis, complicated migraine headaches, or as the sequelae of a prior infectious or inflammatory process. 2.  Mild maxillary and ethmoid sinus disease.   Original Report Authenticated By: Marin Roberts, M.D.    Dg Chest Port 1 View  06/06/2012  *RADIOLOGY REPORT*  Clinical Data: Right-sided chest pain and shortness of breath.  PORTABLE CHEST - 1 VIEW  Comparison: Chest x-ray 03/17/2008.  Findings: Lung volumes are normal.  No consolidative airspace disease.  No pleural effusions.  Mild cephalization of the pulmonary vasculature, without frank pulmonary edema.  Mild cardiomegaly has increased compared to the prior study.  Upper mediastinal contours are within normal limits.  IMPRESSION: 1.  Interval development of mild cardiomegaly with pulmonary venous congestion.   Original Report Authenticated By: Trudie Reed, M.D.     2D Echo:  06/07/12  Study Conclusions - Left ventricle: Inferobasal hypokinesis The cavity size was mildly dilated. There was moderate concentric hypertrophy. Systolic function was normal. The estimated ejection fraction was in the  range of 50% to 55%. - Left atrium: The atrium was mildly dilated. - Atrial septum: No defect or patent foramen ovale was identified.    Admission HPI:  57 y.o PMH DVT presented to Urgent Care with one week  history of URI symptoms (sinus tenderness, sinus problems, nasal congestion and drainage, subjective fever, denies chills). She was taking an over the counter medication without relief (possibly allergy pills). Incidentally at Urgent Care she was found to have elevated blood pressure (204/121). EKG with left atrial enlargement. She was sent from Urgent Care to the ED for further work up. 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 9 days prior to admission are onset of symptoms (most ROS unless otherwise specified).   Review of Systems:  General: +subjective fever, denies chills, +rhinorrhea, +sinus congestion, +generalized weakness, +fatigue, +sweating  HEENT: resolved h/a  CV: +palpitations x 2 weeks, denies chest pain  Lungs: +sob with exertion; +cough with blood x 1 last week  Abdomen: +nausea after eating, denies abdominal pain  GU: denies dysuria  MSK: +right lower back pain x 2 weeks  Extremities: denies lower extremity swelling  Neuro: +lightheadedness, +blurry vision and double vision, +dizziness x 2 weeks, resolved h/a (bitemporal), denies falls   Physical Exam:  Blood pressure 178/108, pulse 64, temperature 97.9 F (36.6 C), temperature source Oral, resp. rate 18, SpO2 100.00%.  VS 87/100%/18/137/85; [id number]  General: lying in bed, nad, alert and oriented x 3  HEENT: [place]/at, perrl b/l  CV: RRR no rubs, murmurs or gallops  Lungs: ctab  Abdomen: soft, obese, ntnd  Extremities: warm, no cyanosis or edema  Neuro: CN  2-12 grossly intact, 5/5 motor strength all 4 extremities, intact finger to nose those had double vision, negative rhomberg, intact heal to shin, wnl reflexes patella and BR    Hospital Course by problem list: 1. Hypertensive urgency 2. Cocaine abuse 3. Resolved QT prolongation  4. Acute upper respiratory infection 5. Hypokalemia    1. Hypertensive urgency 57 year old woman without known significant past medical history who presented with upper respiratory infection and found to be hypertensive on 06/06/12 204/121 at Urgent Care.  There was concern for hypertensive urgency.  Initially her blood pressure was not well controlled in the ED on Cardene drip and with Vasotec 1.25 mg x 1 then her blood pressure trended down to 130s-140s.  We turned the Cardene drip off.  We started her on Lisinopril 20 mg and HCTZ 25 mg daily prior to discharge.  Initially etiology of hypertension was thought to be due to an acute upper respiratory illness but then she was found to be cocaine positive though she denies.  She did not have evidence of end organ damage (nonfocal neurological exam, normal Creatine function). Patient likely has longstanding hypertension and has not seen a PCP recently.  Her HbA1c 5.7 %, Lipid profile (TC 322, TG 380, HDL 47, LDL 199).  We started her on Zocor 20 mg this admission and will change to Pravastatin due to affordability.  Consider diet and exercise modifications as well outpatient.  Elevated pBNP 211.5.  Troponin negative x 3.  CXR suggests pulmonary venous congestion, echo with EF 50-55% inferobasal hypokinesis and LV mildly dilated.  Due to initial dizziness with elevated blood pressures spoke with Dr. Roseanne Reno (Neurology) on call he recommended MRI w/o contrast to which was negative for acute findings such as posterior circulation infarction.    2. Cocaine abuse Patient does not want social work help.  She states she took one of her cousin's hydrocodone and denies using cocaine prior to  admission though she has used cocaine in the past though UDS positive for cocaine.   3. Resolved QT prolongation  QTC 482 on admission with repeat 448.     4. Acute URI  Most likely viral etiology.  Will hold antibiotics for now and monitor. We gave her Claritin this admission. For nasal congestion she can't afford Flonase so will not start but she was given [place].   5. Hypokalemia She was given Kdur this admission   DVT prophylaxis with Lovenox.   Discharge Vitals:  BP 162/108  Pulse 92  Temp(Src) 98.4 F (36.9 C) (Oral)  Resp 20  Ht 5' 6.5" (1.689 m)  Wt 197 lb (89.359 kg)  BMI 31.32 kg/m2  SpO2 99% Discharge physical exam:  General: lying in bed, nad, alert and oriented x 3  HEENT: [place]/at  CV: RRR no rubs, murmurs or gallops  Lungs: ctab  Abdomen: soft, obese, ntnd  Extremities: warm, no cyanosis or edema  Neuro: CN 2-12 grossly intact, 5/5 motor strength all 4 extremities  Discharge Labs:  Results for LYNSI, DOONER (MRN [id number]) as of 06/07/2012 13:00  Ref. Range 06/06/2012 14:41 06/06/2012 20:05 06/06/2012 20:06 06/07/2012 00:35 06/07/2012 06:55  Sodium Latest Range: 135-145 mEq/L   140  137  Potassium Latest Range: 3.5-5.1 mEq/L   3.5  3.4 (L)  Chloride Latest Range: 96-112 mEq/L   104  98  CO2 Latest Range: 19-32 mEq/L   24  27  Mean Plasma Glucose Latest Range: <117 mg/dL   409 (H)    BUN Latest Range: 6-23 mg/dL   8  10  Creatinine Latest Range: 0.50-1.10 mg/dL   8.11  9.14  Calcium Latest Range: 8.4-10.5 mg/dL   9.1  9.3  GFR calc non Af Amer Latest Range: >90 mL/min   >90  >90  GFR calc Af Amer Latest Range: >90 mL/min   >90  >90  Glucose Latest Range: 70-99 mg/dL   782 (H)  96  Alkaline Phosphatase Latest Range: 39-117 U/L   52    Albumin Latest Range: 3.5-5.2 g/dL   3.8    AST Latest Range: 0-37 U/L   20    ALT Latest Range: 0-35 U/L   19    Total Protein Latest Range: 6.0-8.3 g/dL   7.9    Total Bilirubin Latest Range: 0.3-1.2 mg/dL   0.4    Troponin I Latest  Range: <0.30 ng/mL   <0.30 <0.30 <0.30  Troponin i, poc Latest Range: 0.00-0.08 ng/mL 0.00      Pro B Natriuretic peptide (BNP) Latest Range: 0-125 pg/mL   211.5 (H)    Cholesterol Latest Range: 0-200 mg/dL  956 (H)     Triglycerides Latest Range: <150 mg/dL  213 (H)     HDL Latest Range: >39 mg/dL  47     LDL (calc) Latest Range: 0-99 mg/dL  086 (H)     VLDL Latest Range: 0-40 mg/dL  76 (H)     Total CHOL/HDL Ratio No range found  6.9  Results for JENNIGER, FIGIEL (MRN [id number]) as of 06/07/2012 13:00  Ref. Range 06/06/2012 13:13 06/06/2012 14:01 06/07/2012 06:55  WBC Latest Range: 4.0-10.5 K/uL  3.8 (L) 4.4  RBC Latest Range: 3.87-5.11 MIL/uL  4.53 4.65  Hemoglobin Latest Range: 12.0-15.0 g/dL 78.2 95.6 21.3  HCT Latest Range: 36.0-46.0 % 44.0 41.1 41.8  MCV Latest Range: 78.0-100.0 fL  90.7 89.9  MCH Latest Range: 26.0-34.0 pg  30.7 31.2  MCHC Latest Range: 30.0-36.0 g/dL  08.6 57.8  RDW Latest Range: 11.5-15.5 %  12.7 12.5  Platelets Latest Range: 150-400 K/uL  174 181  Neutrophils Relative Latest Range: 43-77 %  44   Lymphocytes Relative Latest Range: 12-46 %  42   Monocytes Relative Latest Range: 3-12 %  8   Eosinophils Relative Latest Range: 0-5 %  5   Basophils Relative Latest Range: 0-1 %  1   NEUT# Latest Range: 1.7-7.7 K/uL  1.7   Lymphocytes Absolute Latest Range: 0.7-4.0 K/uL  1.6   Monocytes Absolute Latest Range: 0.1-1.0 K/uL  0.3   Eosinophils Absolute Latest Range: 0.0-0.7 K/uL  0.2   Basophils Absolute Latest Range: 0.0-0.1 K/uL  0.0    Results for FILOMENA, POKORNEY (MRN [id number]) as of 06/07/2012 13:00  Ref. Range 06/06/2012 12:48  Prothrombin Time Latest Range: 11.6-15.2 seconds 13.1  INR Latest Range: 0.00-1.49  1.00  aPTT Latest Range: 24-37 seconds 27   Results for JELITZA, MANNINEN (MRN [id number]) as of 06/07/2012 13:00  Ref. Range 06/06/2012 20:06  Hemoglobin A1C Latest Range: <5.7 % 5.7 (H)   Results for orders placed during the hospital encounter of 06/06/12 (from the past  24 hour(s))  POCT I-STAT TROPONIN I     Status: None   Collection Time    06/06/12  2:41 PM      Result Value Range   Troponin i, poc 0.00  0.00 - 0.08 ng/mL   Comment 3           LIPID PANEL     Status: Abnormal   Collection Time    06/06/12  8:05 PM      Result Value Range   Cholesterol 322 (*) 0 - 200 mg/dL   Triglycerides 272 (*) <150 mg/dL   HDL 47  >53 mg/dL   Total CHOL/HDL Ratio 6.9     VLDL 76 (*) 0 - 40 mg/dL   LDL Cholesterol 664 (*) 0 - 99 mg/dL  COMPREHENSIVE METABOLIC PANEL     Status: Abnormal   Collection Time    06/06/12  8:06 PM      Result Value Range   Sodium 140  135 - 145 mEq/L   Potassium 3.5  3.5 - 5.1 mEq/L   Chloride 104  96 - 112 mEq/L   CO2 24  19 - 32 mEq/L   Glucose, Bld 121 (*) 70 - 99 mg/dL   BUN 8  6 - 23 mg/dL   Creatinine, Ser 4.03  0.50 - 1.10 mg/dL   Calcium 9.1  8.4 - 47.4 mg/dL   Total Protein 7.9  6.0 - 8.3 g/dL   Albumin 3.8  3.5 - 5.2 g/dL   AST 20  0 - 37 U/L   ALT 19  0 - 35 U/L   Alkaline Phosphatase 52  39 - 117 U/L   Total Bilirubin 0.4  0.3 - 1.2 mg/dL   GFR calc non Af Amer >90  >90 mL/min   GFR calc Af Amer >90  >90 mL/min  TROPONIN I  Status: None   Collection Time    06/06/12  8:06 PM      Result Value Range   Troponin I <0.30  <0.30 ng/mL  HEMOGLOBIN A1C     Status: Abnormal   Collection Time    06/06/12  8:06 PM      Result Value Range   Hemoglobin A1C 5.7 (*) <5.7 %   Mean Plasma Glucose 117 (*) <117 mg/dL  PRO B NATRIURETIC PEPTIDE     Status: Abnormal   Collection Time    06/06/12  8:06 PM      Result Value Range   Pro B Natriuretic peptide (BNP) 211.5 (*) 0 - 125 pg/mL  TROPONIN I     Status: None   Collection Time    06/07/12 12:35 AM      Result Value Range   Troponin I <0.30  <0.30 ng/mL  BASIC METABOLIC PANEL     Status: Abnormal   Collection Time    06/07/12  6:55 AM      Result Value Range   Sodium 137  135 - 145 mEq/L   Potassium 3.4 (*) 3.5 - 5.1 mEq/L   Chloride 98  96 - 112 mEq/L    CO2 27  19 - 32 mEq/L   Glucose, Bld 96  70 - 99 mg/dL   BUN 10  6 - 23 mg/dL   Creatinine, Ser 9.60  0.50 - 1.10 mg/dL   Calcium 9.3  8.4 - 45.4 mg/dL   GFR calc non Af Amer >90  >90 mL/min   GFR calc Af Amer >90  >90 mL/min  CBC     Status: None   Collection Time    06/07/12  6:55 AM      Result Value Range   WBC 4.4  4.0 - 10.5 K/uL   RBC 4.65  3.87 - 5.11 MIL/uL   Hemoglobin 14.5  12.0 - 15.0 g/dL   HCT 09.8  11.9 - 14.7 %   MCV 89.9  78.0 - 100.0 fL   MCH 31.2  26.0 - 34.0 pg   MCHC 34.7  30.0 - 36.0 g/dL   RDW 82.9  56.2 - 13.0 %   Platelets 181  150 - 400 K/uL  TROPONIN I     Status: None   Collection Time    06/07/12  6:55 AM      Result Value Range   Troponin I <0.30  <0.30 ng/mL  URINE RAPID DRUG SCREEN (HOSP PERFORMED)     Status: Abnormal   Collection Time    06/07/12 11:00 AM      Result Value Range   Opiates NONE DETECTED  NONE DETECTED   Cocaine POSITIVE (*) NONE DETECTED   Benzodiazepines NONE DETECTED  NONE DETECTED   Amphetamines NONE DETECTED  NONE DETECTED   Tetrahydrocannabinol NONE DETECTED  NONE DETECTED   Barbiturates NONE DETECTED  NONE DETECTED    Signed: Annett Gula 06/07/2012, 2:22 PM   Time Spent on Discharge: >30 minutes Services Ordered on Discharge: none Equipment Ordered on Discharge: none

## 2012-06-07 NOTE — Progress Notes (Signed)
  Echocardiogram 2D Echocardiogram has been performed.  Ellender Hose A 06/07/2012, 10:21 AM

## 2012-06-10 NOTE — Discharge Summary (Signed)
Internal Medicine Teaching Service Attending Note Date: 06/10/2012  Patient name: [name]  Medical record number: [id number]  Date of birth: 1955-08-07    I evaluated the patient on the day of discharge and discussed the discharge plan with my resident team. I agree with the discharge documentation and disposition. For further details please see the hospital summary by the resident.    Thanks Aletta Edouard 06/10/2012, 3:51 PM

## 2012-06-24 ENCOUNTER — Ambulatory Visit: Payer: Self-pay

## 2012-06-24 ENCOUNTER — Ambulatory Visit: Payer: Self-pay | Admitting: Internal Medicine

## 2012-07-01 ENCOUNTER — Encounter: Payer: Self-pay | Admitting: Internal Medicine

## 2012-07-01 ENCOUNTER — Ambulatory Visit (INDEPENDENT_AMBULATORY_CARE_PROVIDER_SITE_OTHER): Payer: Self-pay | Admitting: Internal Medicine

## 2012-07-01 VITALS — BP 115/84 | HR 86 | Temp 97.6°F | Ht 66.5 in | Wt 196.8 lb

## 2012-07-01 DIAGNOSIS — I10 Essential (primary) hypertension: Secondary | ICD-10-CM

## 2012-07-01 DIAGNOSIS — N179 Acute kidney failure, unspecified: Secondary | ICD-10-CM

## 2012-07-01 DIAGNOSIS — F1410 Cocaine abuse, uncomplicated: Secondary | ICD-10-CM

## 2012-07-01 LAB — BASIC METABOLIC PANEL
CO2: 28 mEq/L (ref 19–32)
Calcium: 10 mg/dL (ref 8.4–10.5)
Chloride: 100 mEq/L (ref 96–112)
Glucose, Bld: 98 mg/dL (ref 70–99)
Potassium: 4.4 mEq/L (ref 3.5–5.3)
Sodium: 138 mEq/L (ref 135–145)

## 2012-07-01 MED ORDER — PRAVASTATIN SODIUM 40 MG PO TABS: 40.0000 mg | ORAL_TABLET | Freq: Every day | ORAL | Status: DC

## 2012-07-01 MED ORDER — LISINOPRIL-HYDROCHLOROTHIAZIDE 20-25 MG PO TABS: 1.0000 | ORAL_TABLET | Freq: Every day | ORAL | Status: DC

## 2012-07-01 NOTE — Addendum Note (Signed)
Addended by: Ignacia Palma on: 07/01/2012 04:11 PM   Modules accepted: Orders

## 2012-07-01 NOTE — Patient Instructions (Signed)
1. Please keep taking your medicines and apply for the orange card. We will see you again in 1-2 months for your health maintenance needs. 2. I will call you if there is something abnormal in your labs.

## 2012-07-01 NOTE — Assessment & Plan Note (Signed)
Denies use after hospitalization. Declines to speak w CSW.

## 2012-07-01 NOTE — Progress Notes (Signed)
Patient ID: [name], female   DOB: [birth year], 57 y.o.   MRN: [id number]  Subjective:   Patient ID: [name] female   DOB: April 05, 1955 57 y.o.   MRN: [id number]  HPI: [name] is a 57 y.o. female with history of cocaine abuse and HTN urgency presenting for hospital follow-up visit.  She was admitted to the hospital for March 6 to March 7 with hypertensive urgency, suspect related to uncontrolled essential hypertension and cocaine use. She had an echocardiogram done during her hospitalization which showed ejection fraction of 50-55% and inferobasal hypokinesis. She had some dizziness during admission and neurology recommended MRI of the brain which was negative for any acute findings. She had negative cardiac enzymes. She was discharged on lisinopril/HCTZ 20/12.5 daily and pravastatin 40 mg for hyperlipidemia. Blood pressure at discharge was 162/108. Today she comes in for followup and has no complaints. She says she's been taking her blood pressure and cholesterol medicine daily.  She denies any cocaine use since hospital discharge and says that she has never used it regularly, there was only one time thing. She denies any dizziness, chest pain, shortness of breath, orthopnea, PND, cough, abdominal pain, nausea, vomiting, diarrhea, constipation, dysuria, hematuria, rash, leg swelling.  Past Medical History  Diagnosis Date  . Hx of blood clots 1993    previously on Coumadin x 3 months in the past   . Hypertension   . Hyperlipidemia    Current Outpatient Prescriptions  Medication Sig Dispense Refill  . ibuprofen (ADVIL,MOTRIN) 200 MG tablet Take 200 mg by mouth every 6 (six) hours as needed for pain, fever or headache.      . lisinopril-hydrochlorothiazide (PRINZIDE,ZESTORETIC) 20-25 MG per tablet Take 1 tablet by mouth daily.  30 tablet  1  . pravastatin (PRAVACHOL) 40 MG tablet Take 1 tablet (40 mg total) by mouth daily.  30 tablet  1   No current facility-administered medications for  this visit.   No family history on file. History   Social History  . Marital Status: Single    Spouse Name: N/A    Number of Children: N/A  . Years of Education: N/A   Social History Main Topics  . Smoking status: Never Smoker   . Smokeless tobacco: None  . Alcohol Use: 1.2 oz/week    2 Cans of beer per week  . Drug Use: No  . Sexually Active: Not Currently   Other Topics Concern  . None   Social History Narrative  . None   Review of Systems: 10 pt ROS performed, pertinent positives and negatives noted in HPI Objective:  Physical Exam: Filed Vitals:   07/01/12 1545  BP: 115/84  Pulse: 86  Temp: 97.6 F (36.4 C)  TempSrc: Oral  Height: 5' 6.5" (1.689 m)  Weight: 196 lb 12.8 oz (89.268 kg)  SpO2: 98%   Constitutional: Vital signs reviewed.  Patient is a well-developed and well-nourished female in no acute distress and cooperative with exam. Alert and oriented x3.  Head: Normocephalic and atraumatic Mouth: no erythema or exudates, MMM Eyes: PERRL, EOMI, conjunctivae normal, No scleral icterus.  Neck: Supple, no masses Cardiovascular: RRR, S1 normal, S2 normal, no MRG, pulses symmetric and intact bilaterally Pulmonary/Chest: CTAB, no wheezes, rales, or rhonchi Abdominal: Soft. Non-tender, non-distended, bowel sounds are normal, no masses, organomegaly, or guarding present.  Musculoskeletal: No joint deformities, erythema, or stiffness, ROM full and no nontender Hematology: no cervical adenopathy or visible bruising Neurological: A&O x3, Strength is normal and symmetric bilaterally,  cranial nerve II-XII are grossly intact, no focal motor deficit, sensory intact to light touch bilaterally.  Skin: Warm, dry and intact. No rash, cyanosis, or clubbing.  Psychiatric: Normal mood and affect.  Assessment & Plan:  She would like to establish care with our clinic. She has had the orange card in the past and will be applying for renewal. She was given a list of record  documents and how to turn them in to [facility]. She will come back in 1-2 months for a visit to establish care here and discuss preventive health needs, including colonoscopy, mammogram and Pap smear.   Please see problem-based charting for remaineder of assessment and plan.

## 2012-07-01 NOTE — Assessment & Plan Note (Signed)
BP Readings from Last 3 Encounters:  07/01/12 115/84  06/07/12 162/108  06/06/12 204/121    Lab Results  Component Value Date   NA 137 06/07/2012   K 3.4* 06/07/2012   CREATININE 0.68 06/07/2012    Assessment:  Blood pressure control: controlled  Progress toward BP goal:  at goal  Comments: Complaint w lisinopril/HCTZ 20/12.5  Plan:  Medications:  continue current medications  Bmet today.

## 2012-07-02 DIAGNOSIS — N179 Acute kidney failure, unspecified: Secondary | ICD-10-CM | POA: Insufficient documentation

## 2012-07-02 MED ORDER — HYDROCHLOROTHIAZIDE 25 MG PO TABS: 25.0000 mg | ORAL_TABLET | Freq: Every day | ORAL | Status: DC

## 2012-07-02 MED ORDER — AMLODIPINE BESYLATE 10 MG PO TABS: 10.0000 mg | ORAL_TABLET | Freq: Every day | ORAL | Status: DC

## 2012-07-02 NOTE — Addendum Note (Signed)
Addended by: Ignacia Palma on: 07/02/2012 05:12 PM   Modules accepted: Orders, Medications

## 2012-07-02 NOTE — Assessment & Plan Note (Signed)
BMET    Component Value Date/Time   NA 138 07/01/2012 1558   K 4.4 07/01/2012 1558   CL 100 07/01/2012 1558   CO2 28 07/01/2012 1558   GLUCOSE 98 07/01/2012 1558   BUN 20 07/01/2012 1558   CREATININE 1.15* 07/01/2012 1558   CREATININE 0.68 06/07/2012 0655   CALCIUM 10.0 07/01/2012 1558   GFRNONAA >90 06/07/2012 0655   GFRAA >90 06/07/2012 0655    Pt's Cr increased 0.7-->1.2 after initation of lisinopril on hospital d/c. Suspect ACE-i AKI. Called pt, instructed to d/c lisinopril-hctz. Will do amlodipine 10 and hctz 25. Return to clinic in 1 week for repeat bmet.

## 2012-07-11 ENCOUNTER — Other Ambulatory Visit: Payer: Self-pay

## 2012-09-05 NOTE — Addendum Note (Signed)
Addended by: Ignacia Palma on: 09/05/2012 01:13 PM   Modules accepted: Orders

## 2013-03-07 ENCOUNTER — Ambulatory Visit: Payer: Self-pay

## 2013-12-30 ENCOUNTER — Emergency Department (INDEPENDENT_AMBULATORY_CARE_PROVIDER_SITE_OTHER)
Admission: EM | Admit: 2013-12-30 | Discharge: 2013-12-30 | Disposition: A | Discharge status: Home / Self Care | Payer: Self-pay | Source: Home / Self Care | Attending: Family Medicine | Admitting: Family Medicine

## 2013-12-30 ENCOUNTER — Encounter (HOSPITAL_COMMUNITY): Payer: Self-pay | Admitting: Emergency Medicine

## 2013-12-30 DIAGNOSIS — M545 Low back pain, unspecified: Secondary | ICD-10-CM

## 2013-12-30 DIAGNOSIS — I10 Essential (primary) hypertension: Secondary | ICD-10-CM

## 2013-12-30 LAB — POCT I-STAT, CHEM 8
BUN: 12 mg/dL (ref 6–23)
CALCIUM ION: 1.16 mmol/L (ref 1.12–1.23)
Chloride: 105 mEq/L (ref 96–112)
Creatinine, Ser: 1 mg/dL (ref 0.50–1.10)
GLUCOSE: 109 mg/dL — AB (ref 70–99)
HEMATOCRIT: 49 % — AB (ref 36.0–46.0)
HEMOGLOBIN: 16.7 g/dL — AB (ref 12.0–15.0)
POTASSIUM: 3.7 meq/L (ref 3.7–5.3)
Sodium: 139 mEq/L (ref 137–147)
TCO2: 25 mmol/L (ref 0–100)

## 2013-12-30 MED ORDER — CYCLOBENZAPRINE HCL 5 MG PO TABS: 5.0000 mg | ORAL_TABLET | Freq: Every evening | ORAL | Status: DC | PRN

## 2013-12-30 MED ORDER — AMLODIPINE BESYLATE 10 MG PO TABS: 10.0000 mg | ORAL_TABLET | Freq: Every day | ORAL | Status: DC

## 2013-12-30 MED ORDER — DICLOFENAC SODIUM 50 MG PO TBEC: 50.0000 mg | DELAYED_RELEASE_TABLET | Freq: Two times a day (BID) | ORAL | Status: DC | PRN

## 2013-12-30 NOTE — ED Provider Notes (Signed)
[name] is a 58 y.o. female who presents to Urgent Care today for back pain. Patient notes left-sided low back pain starting yesterday. Patient noted a pulling sensation in her low back yesterday morning after cleaning her bathtub. She denies any knee pain weakness or numbness. She denies any bowel bladder dysfunction or difficulty walking. The pain is moderate to severe worse with activity and better with rest. She's tried ibuprofen which helps some.  Additionally patient notes a past history for hypertension. She was briefly prescribed lisinopril but stopped taking it after she had an increase in her creatinine. She has not followed up since. No palpitations chest pain shortness of breath or syncope.   Past Medical History  Diagnosis Date  . Hx of blood clots 1993    previously on Coumadin x 3 months in the past   . Hypertension   . Hyperlipidemia    History  Substance Use Topics  . Smoking status: Never Smoker   . Smokeless tobacco: Not on file  . Alcohol Use: 1.2 oz/week    2 Cans of beer per week   ROS as above Medications: No current facility-administered medications for this encounter.   Current Outpatient Prescriptions  Medication Sig Dispense Refill  . amLODipine (NORVASC) 10 MG tablet Take 1 tablet (10 mg total) by mouth daily.  30 tablet  1  . cyclobenzaprine (FLEXERIL) 5 MG tablet Take 1 tablet (5 mg total) by mouth at bedtime as needed for muscle spasms.  20 tablet  0  . diclofenac (VOLTAREN) 50 MG EC tablet Take 1 tablet (50 mg total) by mouth 2 (two) times daily as needed.  60 tablet  0  . pravastatin (PRAVACHOL) 40 MG tablet Take 1 tablet (40 mg total) by mouth daily.  30 tablet  5  . [DISCONTINUED] hydrochlorothiazide (HYDRODIURIL) 25 MG tablet Take 1 tablet (25 mg total) by mouth daily.  30 tablet  11    Exam:  BP 167/120  Pulse 85  Temp(Src) 97.8 F (36.6 C) (Oral)  Resp 16  SpO2 98% Gen: Well NAD HEENT: EOMI,  MMM Lungs: Normal work of breathing.  CTABL Heart: RRR no MRG Abd: NABS, Soft. Nondistended, Nontender Exts: Brisk capillary refill, warm and well perfused.  Back: Nontender to spinal midline. Tender palpation left-sided lumbar paraspinal. Decreased back range of motion because of pain. Normal gait.  Results for orders placed during the hospital encounter of 12/30/13 (from the past 24 hour(s))  POCT I-STAT, CHEM 8     Status: Abnormal   Collection Time    12/30/13 11:55 AM      Result Value Ref Range   Sodium 139  137 - 147 mEq/L   Potassium 3.7  3.7 - 5.3 mEq/L   Chloride 105  96 - 112 mEq/L   BUN 12  6 - 23 mg/dL   Creatinine, Ser 1.00  0.50 - 1.10 mg/dL   Glucose, Bld 109 (*) 70 - 99 mg/dL   Calcium, Ion 1.16  1.12 - 1.23 mmol/L   TCO2 25  0 - 100 mmol/L   Hemoglobin 16.7 (*) 12.0 - 15.0 g/dL   HCT 49.0 (*) 36.0 - 46.0 %   No results found.  Assessment and Plan: 58 y.o. female with  1) lumbago. Myofascial disruption. NSAIDs, heating pad, Flexeril, home exercise program. 2) hypertension: Uncontrolled. Start amlodipine. Followup with PCP.  Discussed warning signs or symptoms. Please see discharge instructions. Patient expresses understanding.     Gregor Hams, MD 12/30/13 [phone]

## 2013-12-30 NOTE — Discharge Instructions (Signed)
Thank you for coming in today. Come back or go to the emergency room if you notice new weakness new numbness problems walking or bowel or bladder problems. Take diclofenac twice daily as needed for pain. Use Flexeril as needed for muscle spasms. Use a heating pad Do exercises listed below. Followup with a primary care provider..   Back Exercises These exercises may help you when beginning to rehabilitate your injury. Your symptoms may resolve with or without further involvement from your physician, physical therapist or athletic trainer. While completing these exercises, remember:   Restoring tissue flexibility helps normal motion to return to the joints. This allows healthier, less painful movement and activity.  An effective stretch should be held for at least 30 seconds.  A stretch should never be painful. You should only feel a gentle lengthening or release in the stretched tissue. STRETCH - Extension, Prone on Elbows   Lie on your stomach on the floor, a bed will be too soft. Place your palms about shoulder width apart and at the height of your head.  Place your elbows under your shoulders. If this is too painful, stack pillows under your chest.  Allow your body to relax so that your hips drop lower and make contact more completely with the floor.  Hold this position for __________ seconds.  Slowly return to lying flat on the floor. Repeat __________ times. Complete this exercise __________ times per day.  RANGE OF MOTION - Extension, Prone Press Ups   Lie on your stomach on the floor, a bed will be too soft. Place your palms about shoulder width apart and at the height of your head.  Keeping your back as relaxed as possible, slowly straighten your elbows while keeping your hips on the floor. You may adjust the placement of your hands to maximize your comfort. As you gain motion, your hands will come more underneath your shoulders.  Hold this position __________  seconds.  Slowly return to lying flat on the floor. Repeat __________ times. Complete this exercise __________ times per day.  RANGE OF MOTION- Quadruped, Neutral Spine   Assume a hands and knees position on a firm surface. Keep your hands under your shoulders and your knees under your hips. You may place padding under your knees for comfort.  Drop your head and point your tail bone toward the ground below you. This will round out your low back like an angry cat. Hold this position for __________ seconds.  Slowly lift your head and release your tail bone so that your back sags into a large arch, like an old horse.  Hold this position for __________ seconds.  Repeat this until you feel limber in your low back.  Now, find your "sweet spot." This will be the most comfortable position somewhere between the two previous positions. This is your neutral spine. Once you have found this position, tense your stomach muscles to support your low back.  Hold this position for __________ seconds. Repeat __________ times. Complete this exercise __________ times per day.  STRETCH - Flexion, Single Knee to Chest   Lie on a firm bed or floor with both legs extended in front of you.  Keeping one leg in contact with the floor, bring your opposite knee to your chest. Hold your leg in place by either grabbing behind your thigh or at your knee.  Pull until you feel a gentle stretch in your low back. Hold __________ seconds.  Slowly release your grasp and repeat the exercise  with the opposite side. Repeat __________ times. Complete this exercise __________ times per day.  STRETCH - Hamstrings, Standing  Stand or sit and extend your right / left leg, placing your foot on a chair or foot stool  Keeping a slight arch in your low back and your hips straight forward.  Lead with your chest and lean forward at the waist until you feel a gentle stretch in the back of your right / left knee or thigh. (When done  correctly, this exercise requires leaning only a small distance.)  Hold this position for __________ seconds. Repeat __________ times. Complete this stretch __________ times per day. STRENGTHENING - Deep Abdominals, Pelvic Tilt   Lie on a firm bed or floor. Keeping your legs in front of you, bend your knees so they are both pointed toward the ceiling and your feet are flat on the floor.  Tense your lower abdominal muscles to press your low back into the floor. This motion will rotate your pelvis so that your tail bone is scooping upwards rather than pointing at your feet or into the floor.  With a gentle tension and even breathing, hold this position for __________ seconds. Repeat __________ times. Complete this exercise __________ times per day.  STRENGTHENING - Abdominals, Crunches   Lie on a firm bed or floor. Keeping your legs in front of you, bend your knees so they are both pointed toward the ceiling and your feet are flat on the floor. Cross your arms over your chest.  Slightly tip your chin down without bending your neck.  Tense your abdominals and slowly lift your trunk high enough to just clear your shoulder blades. Lifting higher can put excessive stress on the low back and does not further strengthen your abdominal muscles.  Control your return to the starting position. Repeat __________ times. Complete this exercise __________ times per day.  STRENGTHENING - Quadruped, Opposite UE/LE Lift   Assume a hands and knees position on a firm surface. Keep your hands under your shoulders and your knees under your hips. You may place padding under your knees for comfort.  Find your neutral spine and gently tense your abdominal muscles so that you can maintain this position. Your shoulders and hips should form a rectangle that is parallel with the floor and is not twisted.  Keeping your trunk steady, lift your right hand no higher than your shoulder and then your left leg no higher than  your hip. Make sure you are not holding your breath. Hold this position __________ seconds.  Continuing to keep your abdominal muscles tense and your back steady, slowly return to your starting position. Repeat with the opposite arm and leg. Repeat __________ times. Complete this exercise __________ times per day. Document Released: 04/07/2005 Document Revised: 06/12/2011 Document Reviewed: 07/02/2008 [facility] Patient Information 2015 [place], [place]. This information is not intended to replace advice given to you by your health care provider. Make sure you discuss any questions you have with your health care provider.  PRIMARY CARE Paramedic at [place], [place] [phone]  Fax [phone]  Therapist, music at [place] [facility] [address] [place], [place] [phone]  Fax [phone]  Therapist, music at [place] / [place] [phone] [address], [place] [phone]  Fax [phone]  [facility] at [facility] [address], [place]  [place], [place]  Fax [phone]  [place]  [place] [address] [place], [place] [phone]  Fax [phone]  [facility] [place], [place] [phone]  Fax [phone]   [place] @ [place] [address] Phone: [phone]   [place] @ [facility] [place]. [place] [address] Phone: [place] @ [place] [address] [place] [address] Phone: [place] @ [place]. [place] [address] Phone: [place] @ [place]. [employer], [place]  [address] Phone: [phone]   [place] @ [place] [address] [place] [address] Phone: [phone]

## 2013-12-30 NOTE — ED Notes (Signed)
C/o left lower back pain that radiates in to the upper left buttock.   On set yesterday.  States "leaning over cleaning tube and with standing felt severe pain in lower back".   Mild relief with ibuprofen.

## 2014-09-18 ENCOUNTER — Ambulatory Visit: Payer: Self-pay

## 2014-10-19 ENCOUNTER — Ambulatory Visit: Payer: Self-pay

## 2015-02-24 ENCOUNTER — Ambulatory Visit: Payer: Self-pay

## 2016-03-24 ENCOUNTER — Ambulatory Visit (HOSPITAL_COMMUNITY)
Admission: EM | Admit: 2016-03-24 | Discharge: 2016-03-24 | Disposition: A | Discharge status: Home / Self Care | Payer: Self-pay | Attending: Family Medicine | Admitting: Family Medicine

## 2016-03-24 ENCOUNTER — Encounter (HOSPITAL_COMMUNITY): Payer: Self-pay | Admitting: Family Medicine

## 2016-03-24 DIAGNOSIS — I10 Essential (primary) hypertension: Secondary | ICD-10-CM

## 2016-03-24 LAB — POCT I-STAT, CHEM 8
BUN: 12 mg/dL (ref 6–20)
CREATININE: 0.9 mg/dL (ref 0.44–1.00)
Calcium, Ion: 1.19 mmol/L (ref 1.15–1.40)
Chloride: 102 mmol/L (ref 101–111)
GLUCOSE: 87 mg/dL (ref 65–99)
HCT: 46 % (ref 36.0–46.0)
HEMOGLOBIN: 15.6 g/dL — AB (ref 12.0–15.0)
POTASSIUM: 4 mmol/L (ref 3.5–5.1)
Sodium: 138 mmol/L (ref 135–145)
TCO2: 26 mmol/L (ref 0–100)

## 2016-03-24 MED ORDER — HYDROCHLOROTHIAZIDE 25 MG PO TABS: 25.0000 mg | ORAL_TABLET | Freq: Every day | ORAL | 1 refills | Status: DC

## 2016-03-24 MED ORDER — ATENOLOL 50 MG PO TABS: 50.0000 mg | ORAL_TABLET | Freq: Every day | ORAL | 1 refills | Status: DC

## 2016-03-24 NOTE — ED Provider Notes (Signed)
[place]    CSN: [id number] Arrival date & time: 03/24/16  1304     History   Chief Complaint Chief Complaint  Patient presents with  . Hypertension    HPI [name] is a 60 y.o. female.   The history is provided by the patient.  Hypertension  This is a recurrent problem. The current episode started 1 to 2 hours ago. The problem has not changed (found on job interview to have hbp this am, , 39yrs ago had hbp but meds stopped) since onset.Pertinent negatives include no chest pain, no abdominal pain, no headaches and no shortness of breath.    Past Medical History:  Diagnosis Date  . Hx of blood clots 1993   previously on Coumadin x 3 months in the past   . Hyperlipidemia   . Hypertension     Patient Active Problem List   Diagnosis Date Noted  . Acute kidney injury ([place]) 07/02/2012  . Unspecified essential hypertension 07/01/2012  . Cocaine abuse 06/07/2012  . Acute URI 06/07/2012  . Hypertensive urgency 06/06/2012    History reviewed. No pertinent surgical history.  OB History    No data available       Home Medications    Prior to Admission medications   Medication Sig Start Date End Date Taking? Authorizing Provider  amLODipine (NORVASC) 10 MG tablet Take 1 tablet (10 mg total) by mouth daily. 12/30/13 12/30/14  Gregor Hams, MD  atenolol (TENORMIN) 50 MG tablet Take 1 tablet (50 mg total) by mouth daily. 03/24/16   Billy Fischer, MD  cyclobenzaprine (FLEXERIL) 5 MG tablet Take 1 tablet (5 mg total) by mouth at bedtime as needed for muscle spasms. 12/30/13   Gregor Hams, MD  diclofenac (VOLTAREN) 50 MG EC tablet Take 1 tablet (50 mg total) by mouth 2 (two) times daily as needed. 12/30/13   Gregor Hams, MD  hydrochlorothiazide (HYDRODIURIL) 25 MG tablet Take 1 tablet (25 mg total) by mouth daily. 03/24/16   Billy Fischer, MD  pravastatin (PRAVACHOL) 40 MG tablet Take 1 tablet (40 mg total) by mouth daily. 07/01/12   Trinda Pascal, MD     Family History History reviewed. No pertinent family history.  Social History Social History  Substance Use Topics  . Smoking status: Never Smoker  . Smokeless tobacco: Never Used  . Alcohol use 1.2 oz/week    2 Cans of beer per week     Allergies   Lisinopril   Review of Systems Review of Systems  Constitutional: Negative.   Respiratory: Negative.  Negative for shortness of breath.   Cardiovascular: Negative.  Negative for chest pain, palpitations and leg swelling.  Gastrointestinal: Negative for abdominal pain.  Neurological: Negative for dizziness and headaches.  All other systems reviewed and are negative.    Physical Exam Triage Vital Signs ED Triage Vitals [03/24/16 1324]  Enc Vitals Group     BP (!) 218/136     Pulse Rate 68     Resp 16     Temp 98.4 F (36.9 C)     Temp src      SpO2 98 %     Weight      Height      Head Circumference      Peak Flow      Pain Score      Pain Loc      Pain Edu?      Excl. in [place]?  No data found.   Updated Vital Signs BP (!) 218/136   Pulse 68   Temp 98.4 F (36.9 C)   Resp 16   SpO2 98%   Visual Acuity Right Eye Distance:   Left Eye Distance:   Bilateral Distance:    Right Eye Near:   Left Eye Near:    Bilateral Near:     Physical Exam  Constitutional: She is oriented to person, place, and time. She appears well-developed and well-nourished. No distress.  HENT:  Head: Normocephalic and atraumatic.  Eyes: Pupils are equal, round, and reactive to light.  Neck: Normal range of motion. Neck supple.  Cardiovascular: Normal rate, regular rhythm, normal heart sounds and intact distal pulses.   Pulmonary/Chest: Effort normal and breath sounds normal.  Musculoskeletal: She exhibits no edema.  Lymphadenopathy:    She has no cervical adenopathy.  Neurological: She is alert and oriented to person, place, and time.  Skin: Skin is warm and dry.  Nursing note and vitals reviewed.    UC Treatments /  Results  Labs (all labs ordered are listed, but only abnormal results are displayed) Labs Reviewed  POCT I-STAT, CHEM 8 - Abnormal; Notable for the following:       Result Value   Hemoglobin 15.6 (*)    All other components within normal limits   I-stat wnl. EKG  EKG Interpretation None       Radiology No results found.  Procedures Procedures (including critical care time)  Medications Ordered in UC Medications - No data to display   Initial Impression / Assessment and Plan / UC Course  I have reviewed the triage vital signs and the nursing notes.  Pertinent labs & imaging results that were available during my care of the patient were reviewed by me and considered in my medical decision making (see chart for details).  Clinical Course       Final Clinical Impressions(s) / UC Diagnoses   Final diagnoses:  Essential hypertension    New Prescriptions Discharge Medication List as of 03/24/2016  2:42 PM    START taking these medications   Details  atenolol (TENORMIN) 50 MG tablet Take 1 tablet (50 mg total) by mouth daily., Starting Fri 03/24/2016, Print    hydrochlorothiazide (HYDRODIURIL) 25 MG tablet Take 1 tablet (25 mg total) by mouth daily., Starting Fri 03/24/2016, Print         Billy Fischer, MD 03/30/16 2053

## 2016-03-24 NOTE — ED Triage Notes (Signed)
[facility]. sts she was there for a job and her BP was 210/116. sts she used to be on BP meds years ago.

## 2016-03-24 NOTE — Discharge Instructions (Signed)
See your doctor for bp followup when possible.

## 2017-07-03 ENCOUNTER — Ambulatory Visit (INDEPENDENT_AMBULATORY_CARE_PROVIDER_SITE_OTHER): Payer: Self-pay | Admitting: Physician Assistant

## 2017-07-25 ENCOUNTER — Ambulatory Visit (INDEPENDENT_AMBULATORY_CARE_PROVIDER_SITE_OTHER): Payer: Self-pay | Admitting: Physician Assistant

## 2017-07-26 ENCOUNTER — Inpatient Hospital Stay (INDEPENDENT_AMBULATORY_CARE_PROVIDER_SITE_OTHER): Payer: Self-pay | Admitting: Physician Assistant

## 2017-08-30 ENCOUNTER — Encounter (INDEPENDENT_AMBULATORY_CARE_PROVIDER_SITE_OTHER): Payer: Self-pay | Admitting: Nurse Practitioner

## 2017-08-30 ENCOUNTER — Ambulatory Visit (INDEPENDENT_AMBULATORY_CARE_PROVIDER_SITE_OTHER): Payer: Self-pay | Admitting: Nurse Practitioner

## 2017-08-30 ENCOUNTER — Other Ambulatory Visit: Payer: Self-pay

## 2017-08-30 VITALS — BP 165/104 | HR 70 | Temp 98.1°F | Ht 66.5 in | Wt 195.2 lb

## 2017-08-30 DIAGNOSIS — I10 Essential (primary) hypertension: Secondary | ICD-10-CM

## 2017-08-30 LAB — POCT GLYCOSYLATED HEMOGLOBIN (HGB A1C): HEMOGLOBIN A1C: 5.3 % (ref 4.0–5.6)

## 2017-08-30 MED ORDER — PRAVASTATIN SODIUM 40 MG PO TABS: 40.0000 mg | ORAL_TABLET | Freq: Every day | ORAL | 0 refills | Status: DC

## 2017-08-30 MED ORDER — AMLODIPINE BESYLATE 10 MG PO TABS: 10.0000 mg | ORAL_TABLET | Freq: Every day | ORAL | 0 refills | Status: DC

## 2017-08-30 MED ORDER — LOSARTAN POTASSIUM-HCTZ 100-25 MG PO TABS: 1.0000 | ORAL_TABLET | Freq: Every day | ORAL | 3 refills | Status: DC

## 2017-08-30 MED FILL — PRAVASTATIN NA 40 MG TAB: 40 | 90 days supply | Qty: 90 | Fill #0

## 2017-08-30 MED FILL — LOSARTAN-HCTZ 100-25 MG TAB: 100-25 | 30 days supply | Qty: 30 | Fill #0

## 2017-08-30 MED FILL — AMLODIPINE BESYLATE 10 MG T: 10 | 30 days supply | Qty: 30 | Fill #0

## 2017-08-30 NOTE — Addendum Note (Signed)
Addended by: Nat Christen on: 08/30/2017 04:44 PM   Modules accepted: Orders

## 2017-08-30 NOTE — Progress Notes (Signed)
Assessment & Plan:  [name] was seen today for new patient (initial visit).  Diagnoses and all orders for this visit:  Essential hypertension -     HgB A1c -     amLODipine (NORVASC) 10 MG tablet; Take 1 tablet (10 mg total) by mouth daily. -     pravastatin (PRAVACHOL) 40 MG tablet; Take 1 tablet (40 mg total) by mouth daily. -     losartan-hydrochlorothiazide (HYZAAR) 100-25 MG tablet; Take 1 tablet by mouth daily. -     CMP14+EGFR -     CBC -     Lipid panel -     TSH Continue all antihypertensives as prescribed.  Remember to bring in your blood pressure log with you for your follow up appointment.  DASH/Mediterranean Diets are healthier choices for HTN.    Patient has been counseled on age-appropriate routine health concerns for screening and prevention. These are reviewed and up-to-date. Referrals have been placed accordingly. Immunizations are up-to-date or declined.    Subjective:   Chief Complaint  Patient presents with  . New Patient (Initial Visit)    HTN   HPI [name] 62 y.o. female presents to office today to establish care as a new patient. She has a history of HTN and LLE DVT.  She has a history of AKI due to Lisinopril (she was taken off prinzide to to kidney function).    Essential Hypertension Chronic. Poorly controlled. She has been out of her medications for over 2 months. She currently denies chest pain, headache, shortness of breath or visual disturbances.  She has not been followed by a primary care provider in several years. She does check her blood pressure  BP Readings from Last 3 Encounters:  08/30/17 (!) 165/104  03/24/16 (!) 218/136  12/30/13 (!) 167/120    Hyperlipidemia Patient presents for follow up to hyperlipidemia.  She is not medication compliant and has not taken her pravastatin in several months. She is not diet compliant and denies skin xanthelasma or statin intolerance including myalgias.  Lab Results  Component Value Date   CHOL 322 (H) 06/06/2012   Lab Results  Component Value Date   HDL 47 06/06/2012   Lab Results  Component Value Date   LDLCALC 199 (H) 06/06/2012   Lab Results  Component Value Date   TRIG 380 (H) 06/06/2012   Lab Results  Component Value Date   CHOLHDL 6.9 06/06/2012    Review of Systems  Constitutional: Negative for fever, malaise/fatigue and weight loss.  HENT: Negative.  Negative for nosebleeds.   Eyes: Negative.  Negative for blurred vision, double vision and photophobia.  Respiratory: Negative.  Negative for cough and shortness of breath.   Cardiovascular: Negative.  Negative for chest pain, palpitations and leg swelling.  Gastrointestinal: Negative.  Negative for heartburn, nausea and vomiting.  Musculoskeletal: Negative.  Negative for myalgias.  Neurological: Negative.  Negative for dizziness, focal weakness, seizures and headaches.  Psychiatric/Behavioral: Negative.  Negative for suicidal ideas.    Past Medical History:  Diagnosis Date  . Hx of blood clots 1993   previously on Coumadin x 3 months in the past   . Hyperlipidemia   . Hypertension     History reviewed. No pertinent surgical history.  History reviewed. No pertinent family history.  Social History Reviewed with no changes to be made today.   Outpatient Medications Prior to Visit  Medication Sig Dispense Refill  . cyclobenzaprine (FLEXERIL) 5 MG tablet Take 1 tablet (5  mg total) by mouth at bedtime as needed for muscle spasms. (Patient not taking: Reported on 08/30/2017) 20 tablet 0  . diclofenac (VOLTAREN) 50 MG EC tablet Take 1 tablet (50 mg total) by mouth 2 (two) times daily as needed. (Patient not taking: Reported on 08/30/2017) 60 tablet 0  . hydrochlorothiazide (HYDRODIURIL) 25 MG tablet Take 1 tablet (25 mg total) by mouth daily. (Patient not taking: Reported on 08/30/2017) 30 tablet 1  . amLODipine (NORVASC) 10 MG tablet Take 1 tablet (10 mg total) by mouth daily. 30 tablet 1  . atenolol  (TENORMIN) 50 MG tablet Take 1 tablet (50 mg total) by mouth daily. (Patient not taking: Reported on 08/30/2017) 30 tablet 1  . pravastatin (PRAVACHOL) 40 MG tablet Take 1 tablet (40 mg total) by mouth daily. (Patient not taking: Reported on 08/30/2017) 30 tablet 5   No facility-administered medications prior to visit.     Allergies  Allergen Reactions  . Lisinopril     Suspect ACE inhibitor allergy as patient creatinine nearly doubled after initiation of lisinopril       Objective:    BP (!) 165/104 (BP Location: Left Arm, Patient Position: Sitting, Cuff Size: Normal)   Pulse 70   Temp 98.1 F (36.7 C) (Oral)   Ht 5' 6.5" (1.689 m)   Wt 195 lb 3.2 oz (88.5 kg)   SpO2 96%   BMI 31.03 kg/m  Wt Readings from Last 3 Encounters:  08/30/17 195 lb 3.2 oz (88.5 kg)  07/01/12 196 lb 12.8 oz (89.3 kg)  06/07/12 197 lb (89.4 kg)    Physical Exam  Constitutional: She is oriented to person, place, and time. She appears well-developed and well-nourished. She is cooperative.  HENT:  Head: Normocephalic and atraumatic.  Eyes: EOM are normal.  Neck: Normal range of motion.  Cardiovascular: Normal rate, regular rhythm and normal heart sounds. Exam reveals no gallop and no friction rub.  No murmur heard. Pulmonary/Chest: Effort normal and breath sounds normal. No tachypnea. No respiratory distress. She has no decreased breath sounds. She has no wheezes. She has no rhonchi. She has no rales. She exhibits no tenderness.  Musculoskeletal: Normal range of motion. She exhibits no edema.  Neurological: She is alert and oriented to person, place, and time. Coordination normal.  Skin: Skin is warm and dry.  Psychiatric: She has a normal mood and affect. Her behavior is normal. Judgment and thought content normal.  Nursing note and vitals reviewed.      Patient has been counseled extensively about nutrition and exercise as well as the importance of adherence with medications and regular follow-up.  The patient was given clear instructions to go to ER or return to medical center if symptoms don't improve, worsen or new problems develop. The patient verbalized understanding.   Follow-up: Return in about 2 weeks (around 09/13/2017) for BP recheck/labs.   Gildardo Pounds, FNP-BC [facility] [facility] [place], [place]   08/30/2017, 4:11 PM

## 2017-08-30 NOTE — Patient Instructions (Signed)
DASH Eating Plan DASH stands for "Dietary Approaches to Stop Hypertension." The DASH eating plan is a healthy eating plan that has been shown to reduce high blood pressure (hypertension). It may also reduce your risk for type 2 diabetes, heart disease, and stroke. The DASH eating plan may also help with weight loss. What are tips for following this plan? General guidelines  Avoid eating more than 2,300 mg (milligrams) of salt (sodium) a day. If you have hypertension, you may need to reduce your sodium intake to 1,500 mg a day.  Limit alcohol intake to no more than 1 drink a day for nonpregnant women and 2 drinks a day for men. One drink equals 12 oz of beer, 5 oz of wine, or 1 oz of hard liquor.  Work with your health care provider to maintain a healthy body weight or to lose weight. Ask what an ideal weight is for you.  Get at least 30 minutes of exercise that causes your heart to beat faster (aerobic exercise) most days of the week. Activities may include walking, swimming, or biking.  Work with your health care provider or diet and nutrition specialist (dietitian) to adjust your eating plan to your individual calorie needs. Reading food labels  Check food labels for the amount of sodium per serving. Choose foods with less than 5 percent of the Daily Value of sodium. Generally, foods with less than 300 mg of sodium per serving fit into this eating plan.  To find whole grains, look for the word "whole" as the first word in the ingredient list. Shopping  Buy products labeled as "low-sodium" or "no salt added."  Buy fresh foods. Avoid canned foods and premade or frozen meals. Cooking  Avoid adding salt when cooking. Use salt-free seasonings or herbs instead of table salt or sea salt. Check with your health care provider or pharmacist before using salt substitutes.  Do not fry foods. Cook foods using healthy methods such as baking, boiling, grilling, and broiling instead.  Cook with  heart-healthy oils, such as olive, canola, soybean, or sunflower oil. Meal planning   Eat a balanced diet that includes: ? 5 or more servings of fruits and vegetables each day. At each meal, try to fill half of your plate with fruits and vegetables. ? Up to 6-8 servings of whole grains each day. ? Less than 6 oz of lean meat, poultry, or fish each day. A 3-oz serving of meat is about the same size as a deck of cards. One egg equals 1 oz. ? 2 servings of low-fat dairy each day. ? A serving of nuts, seeds, or beans 5 times each week. ? Heart-healthy fats. Healthy fats called Omega-3 fatty acids are found in foods such as flaxseeds and coldwater fish, like sardines, salmon, and mackerel.  Limit how much you eat of the following: ? Canned or prepackaged foods. ? Food that is high in trans fat, such as fried foods. ? Food that is high in saturated fat, such as fatty meat. ? Sweets, desserts, sugary drinks, and other foods with added sugar. ? Full-fat dairy products.  Do not salt foods before eating.  Try to eat at least 2 vegetarian meals each week.  Eat more home-cooked food and less restaurant, buffet, and fast food.  When eating at a restaurant, ask that your food be prepared with less salt or no salt, if possible. What foods are recommended? The items listed may not be a complete list. Talk with your dietitian about what   dietary choices are best for you. Grains Whole-grain or whole-wheat bread. Whole-grain or whole-wheat pasta. Brown rice. Oatmeal. Quinoa. Bulgur. Whole-grain and low-sodium cereals. Pita bread. Low-fat, low-sodium crackers. Whole-wheat flour tortillas. Vegetables Fresh or frozen vegetables (raw, steamed, roasted, or grilled). Low-sodium or reduced-sodium tomato and vegetable juice. Low-sodium or reduced-sodium tomato sauce and tomato paste. Low-sodium or reduced-sodium canned vegetables. Fruits All fresh, dried, or frozen fruit. Canned fruit in natural juice (without  added sugar). Meat and other protein foods Skinless chicken or turkey. Ground chicken or turkey. Pork with fat trimmed off. Fish and seafood. Egg whites. Dried beans, peas, or lentils. Unsalted nuts, nut butters, and seeds. Unsalted canned beans. Lean cuts of beef with fat trimmed off. Low-sodium, lean deli meat. Dairy Low-fat (1%) or fat-free (skim) milk. Fat-free, low-fat, or reduced-fat cheeses. Nonfat, low-sodium ricotta or cottage cheese. Low-fat or nonfat yogurt. Low-fat, low-sodium cheese. Fats and oils Soft margarine without trans fats. Vegetable oil. Low-fat, reduced-fat, or light mayonnaise and salad dressings (reduced-sodium). Canola, safflower, olive, soybean, and sunflower oils. Avocado. Seasoning and other foods Herbs. Spices. Seasoning mixes without salt. Unsalted popcorn and pretzels. Fat-free sweets. What foods are not recommended? The items listed may not be a complete list. Talk with your dietitian about what dietary choices are best for you. Grains Baked goods made with fat, such as croissants, muffins, or some breads. Dry pasta or rice meal packs. Vegetables Creamed or fried vegetables. Vegetables in a cheese sauce. Regular canned vegetables (not low-sodium or reduced-sodium). Regular canned tomato sauce and paste (not low-sodium or reduced-sodium). Regular tomato and vegetable juice (not low-sodium or reduced-sodium). Pickles. Olives. Fruits Canned fruit in a light or heavy syrup. Fried fruit. Fruit in cream or butter sauce. Meat and other protein foods Fatty cuts of meat. Ribs. Fried meat. Bacon. Sausage. Bologna and other processed lunch meats. Salami. Fatback. Hotdogs. Bratwurst. Salted nuts and seeds. Canned beans with added salt. Canned or smoked fish. Whole eggs or egg yolks. Chicken or turkey with skin. Dairy Whole or 2% milk, cream, and half-and-half. Whole or full-fat cream cheese. Whole-fat or sweetened yogurt. Full-fat cheese. Nondairy creamers. Whipped toppings.  Processed cheese and cheese spreads. Fats and oils Butter. Stick margarine. Lard. Shortening. Ghee. Bacon fat. Tropical oils, such as coconut, palm kernel, or palm oil. Seasoning and other foods Salted popcorn and pretzels. Onion salt, garlic salt, seasoned salt, table salt, and sea salt. Worcestershire sauce. Tartar sauce. Barbecue sauce. Teriyaki sauce. Soy sauce, including reduced-sodium. Steak sauce. Canned and packaged gravies. Fish sauce. Oyster sauce. Cocktail sauce. Horseradish that you find on the shelf. Ketchup. Mustard. Meat flavorings and tenderizers. Bouillon cubes. Hot sauce and Tabasco sauce. Premade or packaged marinades. Premade or packaged taco seasonings. Relishes. Regular salad dressings. Where to find more information:  National Heart, Lung, and Blood Institute: www.nhlbi.nih.gov  American Heart Association: www.heart.org Summary  The DASH eating plan is a healthy eating plan that has been shown to reduce high blood pressure (hypertension). It may also reduce your risk for type 2 diabetes, heart disease, and stroke.  With the DASH eating plan, you should limit salt (sodium) intake to 2,300 mg a day. If you have hypertension, you may need to reduce your sodium intake to 1,500 mg a day.  When on the DASH eating plan, aim to eat more fresh fruits and vegetables, whole grains, lean proteins, low-fat dairy, and heart-healthy fats.  Work with your health care provider or diet and nutrition specialist (dietitian) to adjust your eating plan to your individual   calorie needs. This information is not intended to replace advice given to you by your health care provider. Make sure you discuss any questions you have with your health care provider. Document Released: 03/09/2011 Document Revised: 03/13/2016 Document Reviewed: 03/13/2016 Elsevier Interactive Patient Education  2018 [employer] With Less [employer] with less salt is one way to reduce the amount of sodium you  get from food. Depending on your condition and overall health, your health care provider or diet and nutrition specialist (dietitian) may recommend that you reduce your sodium intake. Most people should have less than 2,300 milligrams (mg) of sodium each day. If you have high blood pressure (hypertension), you may need to limit your sodium to 1,500 mg each day. Follow the tips below to help reduce your sodium intake. What do I need to know about cooking with less salt? Shopping  Buy sodium-free or low-sodium products. Look for the following words on food labels: ? Low-sodium. ? Sodium-free. ? Reduced-sodium. ? No salt added. ? Unsalted.  Buy fresh or frozen vegetables. Avoid canned vegetables.  Avoid buying meats or protein foods that have been injected with broth or saline solution.  Avoid cured or smoked meats, such as hot dogs, bacon, salami, ham, and bologna. Reading food labels  Check the food label before buying or using packaged ingredients.  Look for products with no more than 140 mg of sodium in one serving.  Do not choose foods with salt as one of the first three ingredients on the ingredients list. If salt is one of the first three ingredients, it usually means the item is high in sodium, because ingredients are listed in order of amount in the food item. Cooking  Use herbs, seasonings without salt, and spices as substitutes for salt in foods.  Use sodium-free baking soda when baking.  Grill, braise, or roast foods to add flavor with less salt.  Avoid adding salt to pasta, rice, or hot cereals while cooking.  Drain and rinse canned vegetables before use.  Avoid adding salt when cooking sweets and desserts.  Cook with low-sodium ingredients. What are some salt alternatives? The following are herbs, seasonings, and spices that can be used instead of salt to give taste to your food. Herbs should be fresh or dried. Do not choose packaged mixes. Next to the name of the  herb, spice, or seasoning are some examples of foods you can pair it with. Herbs  Bay leaves - Soups, meat and vegetable dishes, and spaghetti sauce.  Basil - [employer], soups, pasta, and fish dishes.  Cilantro - Meat, poultry, and vegetable dishes.  Chili powder - Marinades and Mexican dishes.  Chives - Salad dressings and potato dishes.  Cumin - Mexican dishes, couscous, and meat dishes.  Dill - Fish dishes, sauces, and salads.  Fennel - Meat and vegetable dishes, breads, and cookies.  Garlic (do not use garlic salt) - [place] dishes, meat dishes, salad dressings, and sauces.  Marjoram - Soups, potato dishes, and meat dishes.  Oregano - Pizza and spaghetti sauce.  Parsley - Salads, soups, pasta, and meat dishes.  Rosemary - [place] dishes, salad dressings, soups, and red meats.  Saffron - Fish dishes, pasta, and some poultry dishes.  Sage - Stuffings and sauces.  Tarragon - Fish and [employer].  Thyme - Stuffing, meat, and fish dishes. Seasonings  Lemon juice - Fish dishes, poultry dishes, vegetables, and salads.  Vinegar - Salad dressings, vegetables, and fish dishes. Spices  Cinnamon -  Sweet dishes, such as cakes, cookies, and puddings.  Cloves - Gingerbread, puddings, and marinades for meats.  Curry - Vegetable dishes, fish and poultry dishes, and stir-fry dishes.  Ginger - Vegetables dishes, fish dishes, and stir-fry dishes.  Nutmeg - Pasta, vegetables, poultry, fish dishes, and custard. What are some low-sodium ingredients and foods?  Fresh or frozen fruits and vegetables with no sauce added.  Fresh or frozen whole meats, poultry, and fish with no sauce added.  Eggs.  Noodles, pasta, quinoa, rice.  Shredded or puffed wheat or puffed rice.  Regular or quick oats.  Milk, yogurt, hard cheeses, and low-sodium cheeses. Good cheese choices include Swiss, [place]. Always check the label for the serving size and sodium  content.  Unsalted butter or margarine.  Unsalted nuts.  Sherbet or ice cream (keep to  cup per serving).  Homemade pudding.  Sodium-free baking soda and baking powder. This is not a complete list of low-sodium ingredients and foods. Contact your dietitian for more options. Summary  Cooking with less salt is one way to reduce the amount of sodium that you get from food.  Buy sodium-free or low-sodium products.  Check the food label before using or buying packaged ingredients.  Use herbs, seasonings without salt, and spices as substitutes for salt in foods. This information is not intended to replace advice given to you by your health care provider. Make sure you discuss any questions you have with your health care provider. Document Released: 03/20/2005 Document Revised: 03/28/2016 Document Reviewed: 03/28/2016 Elsevier Interactive Patient Education  2017 [employer]  Hypertension Hypertension, commonly called high blood pressure, is when the force of blood pumping through the arteries is too strong. The arteries are the blood vessels that carry blood from the heart throughout the body. Hypertension forces the heart to work harder to pump blood and may cause arteries to become narrow or stiff. Having untreated or uncontrolled hypertension can cause heart attacks, strokes, kidney disease, and other problems. A blood pressure reading consists of a higher number over a lower number. Ideally, your blood pressure should be below 120/80. The first ("top") number is called the systolic pressure. It is a measure of the pressure in your arteries as your heart beats. The second ("bottom") number is called the diastolic pressure. It is a measure of the pressure in your arteries as the heart relaxes. What are the causes? The cause of this condition is not known. What increases the risk? Some risk factors for high blood pressure are under your control. Others are not. Factors you can  change  Smoking.  Having type 2 diabetes mellitus, high cholesterol, or both.  Not getting enough exercise or physical activity.  Being overweight.  Having too much fat, sugar, calories, or salt (sodium) in your diet.  Drinking too much alcohol. Factors that are difficult or impossible to change  Having chronic kidney disease.  Having a family history of high blood pressure.  Age. Risk increases with age.  Race. You may be at higher risk if you are African-American.  Gender. Men are at higher risk than women before age 34. After age 62, women are at higher risk than men.  Having obstructive sleep apnea.  Stress. What are the signs or symptoms? Extremely high blood pressure (hypertensive crisis) may cause:  Headache.  Anxiety.  Shortness of breath.  Nosebleed.  Nausea and vomiting.  Severe chest pain.  Jerky movements you cannot control (seizures).  How is this diagnosed? This condition  is diagnosed by measuring your blood pressure while you are seated, with your arm resting on a surface. The cuff of the blood pressure monitor will be placed directly against the skin of your upper arm at the level of your heart. It should be measured at least twice using the same arm. Certain conditions can cause a difference in blood pressure between your right and left arms. Certain factors can cause blood pressure readings to be lower or higher than normal (elevated) for a short period of time:  When your blood pressure is higher when you are in a health care provider's office than when you are at home, this is called white coat hypertension. Most people with this condition do not need medicines.  When your blood pressure is higher at home than when you are in a health care provider's office, this is called masked hypertension. Most people with this condition may need medicines to control blood pressure.  If you have a high blood pressure reading during one visit or you have  normal blood pressure with other risk factors:  You may be asked to return on a different day to have your blood pressure checked again.  You may be asked to monitor your blood pressure at home for 1 week or longer.  If you are diagnosed with hypertension, you may have other blood or imaging tests to help your health care provider understand your overall risk for other conditions. How is this treated? This condition is treated by making healthy lifestyle changes, such as eating healthy foods, exercising more, and reducing your alcohol intake. Your health care provider may prescribe medicine if lifestyle changes are not enough to get your blood pressure under control, and if:  Your systolic blood pressure is above 130.  Your diastolic blood pressure is above 80.  Your personal target blood pressure may vary depending on your medical conditions, your age, and other factors. Follow these instructions at home: Eating and drinking  Eat a diet that is high in fiber and potassium, and low in sodium, added sugar, and fat. An example eating plan is called the DASH (Dietary Approaches to Stop Hypertension) diet. To eat this way: ? Eat plenty of fresh fruits and vegetables. Try to fill half of your plate at each meal with fruits and vegetables. ? Eat whole grains, such as whole wheat pasta, brown rice, or whole grain bread. Fill about one quarter of your plate with whole grains. ? Eat or drink low-fat dairy products, such as skim milk or low-fat yogurt. ? Avoid fatty cuts of meat, processed or cured meats, and poultry with skin. Fill about one quarter of your plate with lean proteins, such as fish, chicken without skin, beans, eggs, and tofu. ? Avoid premade and processed foods. These tend to be higher in sodium, added sugar, and fat.  Reduce your daily sodium intake. Most people with hypertension should eat less than 1,500 mg of sodium a day.  Limit alcohol intake to no more than 1 drink a day for  nonpregnant women and 2 drinks a day for men. One drink equals 12 oz of beer, 5 oz of wine, or 1 oz of hard liquor. Lifestyle  Work with your health care provider to maintain a healthy body weight or to lose weight. Ask what an ideal weight is for you.  Get at least 30 minutes of exercise that causes your heart to beat faster (aerobic exercise) most days of the week. Activities may include walking, swimming, or  biking.  Include exercise to strengthen your muscles (resistance exercise), such as pilates or lifting weights, as part of your weekly exercise routine. Try to do these types of exercises for 30 minutes at least 3 days a week.  Do not use any products that contain nicotine or tobacco, such as cigarettes and e-cigarettes. If you need help quitting, ask your health care provider.  Monitor your blood pressure at home as told by your health care provider.  Keep all follow-up visits as told by your health care provider. This is important. Medicines  Take over-the-counter and prescription medicines only as told by your health care provider. Follow directions carefully. Blood pressure medicines must be taken as prescribed.  Do not skip doses of blood pressure medicine. Doing this puts you at risk for problems and can make the medicine less effective.  Ask your health care provider about side effects or reactions to medicines that you should watch for. Contact a health care provider if:  You think you are having a reaction to a medicine you are taking.  You have headaches that keep coming back (recurring).  You feel dizzy.  You have swelling in your ankles.  You have trouble with your vision. Get help right away if:  You develop a severe headache or confusion.  You have unusual weakness or numbness.  You feel faint.  You have severe pain in your chest or abdomen.  You vomit repeatedly.  You have trouble breathing. Summary  Hypertension is when the force of blood pumping  through your arteries is too strong. If this condition is not controlled, it may put you at risk for serious complications.  Your personal target blood pressure may vary depending on your medical conditions, your age, and other factors. For most people, a normal blood pressure is less than 120/80.  Hypertension is treated with lifestyle changes, medicines, or a combination of both. Lifestyle changes include weight loss, eating a healthy, low-sodium diet, exercising more, and limiting alcohol. This information is not intended to replace advice given to you by your health care provider. Make sure you discuss any questions you have with your health care provider. Document Released: 03/20/2005 Document Revised: 02/16/2016 Document Reviewed: 02/16/2016 Elsevier Interactive Patient Education  [employer].

## 2017-08-31 LAB — CBC
HEMATOCRIT: 40.9 % (ref 34.0–46.6)
Hemoglobin: 13.4 g/dL (ref 11.1–15.9)
MCH: 30.2 pg (ref 26.6–33.0)
MCHC: 32.8 g/dL (ref 31.5–35.7)
MCV: 92 fL (ref 79–97)
Platelets: 231 10*3/uL (ref 150–450)
RBC: 4.43 x10E6/uL (ref 3.77–5.28)
RDW: 14 % (ref 12.3–15.4)
WBC: 5.1 10*3/uL (ref 3.4–10.8)

## 2017-08-31 LAB — CMP14+EGFR
A/G RATIO: 1.5 (ref 1.2–2.2)
ALBUMIN: 4.5 g/dL (ref 3.6–4.8)
ALT: 21 IU/L (ref 0–32)
AST: 22 IU/L (ref 0–40)
Alkaline Phosphatase: 50 IU/L (ref 39–117)
BILIRUBIN TOTAL: 0.3 mg/dL (ref 0.0–1.2)
BUN/Creatinine Ratio: 18 (ref 12–28)
BUN: 12 mg/dL (ref 8–27)
CHLORIDE: 99 mmol/L (ref 96–106)
CO2: 24 mmol/L (ref 20–29)
Calcium: 9.4 mg/dL (ref 8.7–10.3)
Creatinine, Ser: 0.65 mg/dL (ref 0.57–1.00)
GFR calc non Af Amer: 95 mL/min/{1.73_m2} (ref >59)
GFR, EST AFRICAN AMERICAN: 110 mL/min/{1.73_m2} (ref >59)
Globulin, Total: 3.1 g/dL (ref 1.5–4.5)
Glucose: 92 mg/dL (ref 65–99)
POTASSIUM: 4 mmol/L (ref 3.5–5.2)
Sodium: 138 mmol/L (ref 134–144)
TOTAL PROTEIN: 7.6 g/dL (ref 6.0–8.5)

## 2017-08-31 LAB — LIPID PANEL
CHOLESTEROL TOTAL: 260 mg/dL — AB (ref 100–199)
Chol/HDL Ratio: 4.7 ratio — ABNORMAL HIGH (ref 0.0–4.4)
HDL: 55 mg/dL (ref >39)
LDL CALC: 169 mg/dL — AB (ref 0–99)
Triglycerides: 180 mg/dL — ABNORMAL HIGH (ref 0–149)
VLDL CHOLESTEROL CAL: 36 mg/dL (ref 5–40)

## 2017-08-31 LAB — TSH: TSH: 1.8 u[IU]/mL (ref 0.450–4.500)

## 2017-09-03 ENCOUNTER — Telehealth: Payer: Self-pay

## 2017-09-03 ENCOUNTER — Telehealth: Payer: Self-pay | Admitting: *Deleted

## 2017-09-03 NOTE — Telephone Encounter (Signed)
CMA attempt to call patient to inform on lab results. No answer and left a VM for patient to call back.  If patient call back, please inform:  All of your labs are normal except your cholesterol levels are very elevated. Make sure you are taking your pravachol daily as prescribed.  Work on a low fat, heart healthy diet and participate in regular aerobic exercise program by working out at least 150 minutes per week. No fried foods. No junk foods, sodas, sugary drinks, unhealthy snacking, alcohol or smoking.

## 2017-09-03 NOTE — Telephone Encounter (Signed)
-----   Message from Gildardo Pounds, NP sent at 09/02/2017 10:17 PM EDT ----- All of your labs are normal except your cholesterol levels are very elevated. Make sure you are taking your pravachol daily as prescribed.  Work on a low fat, heart healthy diet and participate in regular aerobic exercise program by working out at least 150 minutes per week. No fried foods. No junk foods, sodas, sugary drinks, unhealthy snacking, alcohol or smoking.

## 2017-09-03 NOTE — Telephone Encounter (Signed)
Patient verified DOB Patient is aware of labs being normal except for cholesterol. Patient advised to taker medication as prescribed and to limit saturated fat intake. Patient expressed understanding. Patient will call Monday to schedule FC.

## 2017-09-13 ENCOUNTER — Encounter (INDEPENDENT_AMBULATORY_CARE_PROVIDER_SITE_OTHER): Payer: Self-pay

## 2017-09-17 ENCOUNTER — Ambulatory Visit (INDEPENDENT_AMBULATORY_CARE_PROVIDER_SITE_OTHER): Payer: Self-pay | Admitting: Physician Assistant

## 2017-09-17 ENCOUNTER — Encounter (INDEPENDENT_AMBULATORY_CARE_PROVIDER_SITE_OTHER): Payer: Self-pay | Admitting: Physician Assistant

## 2017-09-17 VITALS — BP 166/108 | HR 70 | Temp 97.9°F

## 2017-09-17 DIAGNOSIS — I10 Essential (primary) hypertension: Secondary | ICD-10-CM

## 2017-09-17 MED ORDER — AMLODIPINE BESYLATE 5 MG PO TABS: 5.0000 mg | ORAL_TABLET | Freq: Every day | ORAL | 3 refills | Status: DC

## 2017-09-17 MED ORDER — CLONIDINE HCL 0.1 MG PO TABS
0.1000 mg | ORAL_TABLET | Freq: Once | ORAL | Status: AC
  Administered 2017-09-17: 0.1 mg via ORAL

## 2017-09-17 MED ORDER — HYDROCHLOROTHIAZIDE 12.5 MG PO TABS: 12.5000 mg | ORAL_TABLET | Freq: Every day | ORAL | 3 refills | Status: DC

## 2017-09-17 NOTE — Progress Notes (Signed)
Subjective:  Patient ID: [name], female    DOB: 02-May-1955  Age: 62 y.o. MRN: [id number]  CC: BP check  HPI [name] is a 62 y.o. female with a medical history of HLD and HTN presents for a nurse visit for recheck of BP. Was prescribed Hyzaar 100-25 mg and amlodipine 10 mg but has stopped these on her own because of nausea. Pt has intolerance to lisinopril and BP was 165/104 mmHg when seen 18 days ago. Patient does not endorse any symptoms today.       Outpatient Medications Prior to Visit  Medication Sig Dispense Refill  . pravastatin (PRAVACHOL) 40 MG tablet Take 1 tablet (40 mg total) by mouth daily. 90 tablet 0  . amLODipine (NORVASC) 10 MG tablet Take 1 tablet (10 mg total) by mouth daily. 90 tablet 0  . losartan-hydrochlorothiazide (HYZAAR) 100-25 MG tablet Take 1 tablet by mouth daily. (Patient not taking: Reported on 09/17/2017) 90 tablet 3  . cyclobenzaprine (FLEXERIL) 5 MG tablet Take 1 tablet (5 mg total) by mouth at bedtime as needed for muscle spasms. (Patient not taking: Reported on 08/30/2017) 20 tablet 0  . diclofenac (VOLTAREN) 50 MG EC tablet Take 1 tablet (50 mg total) by mouth 2 (two) times daily as needed. (Patient not taking: Reported on 08/30/2017) 60 tablet 0  . hydrochlorothiazide (HYDRODIURIL) 25 MG tablet Take 1 tablet (25 mg total) by mouth daily. (Patient not taking: Reported on 08/30/2017) 30 tablet 1   No facility-administered medications prior to visit.      ROS Review of Systems  Constitutional: Negative for chills, fever and malaise/fatigue.  Eyes: Negative for blurred vision.  Respiratory: Negative for shortness of breath.   Cardiovascular: Negative for chest pain and palpitations.  Gastrointestinal: Negative for abdominal pain and nausea.  Genitourinary: Negative for dysuria and hematuria.  Musculoskeletal: Negative for joint pain and myalgias.  Skin: Negative for rash.  Neurological: Negative for tingling and headaches.   Psychiatric/Behavioral: Negative for depression. The patient is not nervous/anxious.     Objective:  BP (!) 166/108 (BP Location: Left Arm, Patient Position: Sitting, Cuff Size: Normal)   Pulse 70   Temp 97.9 F (36.6 C) (Oral)   SpO2 95%   BP/Weight 09/17/2017 08/30/2017 74/03/8785  Systolic BP 767 209 470  Diastolic BP 962 836 629  Wt. (Lbs) - 195.2 -  BMI - 31.03 -      Physical Exam  Constitutional: She is oriented to person, place, and time.  Well developed, well nourished, NAD, polite  HENT:  Head: Normocephalic and atraumatic.  Eyes: No scleral icterus.  Neck: Normal range of motion. Neck supple. No thyromegaly present.  Pulmonary/Chest: Effort normal.  Neurological: She is alert and oriented to person, place, and time.  Skin: No pallor.  Psychiatric: She has a normal mood and affect. Her behavior is normal. Thought content normal.  Vitals reviewed.    Assessment & Plan:    1. Essential hypertension - cloNIDine (CATAPRES) tablet 0.1 mg - Begin hydrochlorothiazide (HYDRODIURIL) 12.5 MG tablet; Take 1 tablet (12.5 mg total) by mouth daily.  Dispense: 90 tablet; Refill: 3 - Begin amLODipine (NORVASC) 5 MG tablet; Take 1 tablet (5 mg total) by mouth daily.  Dispense: 90 tablet; Refill: 3 - Stop Hyzaar and amlodipine 10 mg - I believe three antihypertensives for BP of 165/104 mmHg may have been too much and too soon. Patient may have also had reaction to losartan since she already has intolerance to lisinopril  but I am uncertain. Will try to lower BP gradually with two antihypertensives before considering a third.     Meds ordered this encounter  Medications  . cloNIDine (CATAPRES) tablet 0.1 mg  . hydrochlorothiazide (HYDRODIURIL) 12.5 MG tablet    Sig: Take 1 tablet (12.5 mg total) by mouth daily.    Dispense:  90 tablet    Refill:  3    Order Specific Question:   Supervising Provider    Answer:   Charlott Rakes [[id number]]  . amLODipine (NORVASC) 5 MG tablet     Sig: Take 1 tablet (5 mg total) by mouth daily.    Dispense:  90 tablet    Refill:  3    Order Specific Question:   Supervising Provider    Answer:   Charlott Rakes [[id number]]    Follow-up: 4 weeks for HTN f/u   Clent Demark PA

## 2017-09-19 ENCOUNTER — Ambulatory Visit: Payer: Self-pay | Attending: Physician Assistant

## 2017-09-21 ENCOUNTER — Telehealth (INDEPENDENT_AMBULATORY_CARE_PROVIDER_SITE_OTHER): Payer: Self-pay | Admitting: Physician Assistant

## 2017-09-21 NOTE — Telephone Encounter (Signed)
Patient called in regard of high blood pressure medication. Patient stated that PCP said it was not going to be more than $10 and the total at [facility] came to be $27 and some change. She would like to know if PCP could transfer medication to   [place].  Please Follow Up [phone]

## 2017-09-21 NOTE — Telephone Encounter (Signed)
Patient would prefer that both antihypertensives be sent to [place]. Nat Christen, CMA

## 2017-09-24 ENCOUNTER — Other Ambulatory Visit (INDEPENDENT_AMBULATORY_CARE_PROVIDER_SITE_OTHER): Payer: Self-pay | Admitting: Physician Assistant

## 2017-09-24 DIAGNOSIS — I10 Essential (primary) hypertension: Secondary | ICD-10-CM

## 2017-09-24 MED ORDER — HYDROCHLOROTHIAZIDE 12.5 MG PO TABS: 12.5000 mg | ORAL_TABLET | Freq: Every day | ORAL | 11 refills | Status: DC

## 2017-09-24 MED ORDER — PRAVASTATIN SODIUM 40 MG PO TABS: 40.0000 mg | ORAL_TABLET | Freq: Every day | ORAL | 11 refills | Status: DC

## 2017-09-24 MED ORDER — AMLODIPINE BESYLATE 5 MG PO TABS: 5.0000 mg | ORAL_TABLET | Freq: Every day | ORAL | 11 refills | Status: DC

## 2017-09-24 MED FILL — AMLODIPINE BESYLATE 5 MG TA: 5 | 30 days supply | Qty: 30 | Fill #0

## 2017-09-24 MED FILL — HYDROCHLOROTHIAZIDE 12.5 MG: 12.5 | 30 days supply | Qty: 30 | Fill #0

## 2017-09-24 NOTE — Telephone Encounter (Signed)
Meds sent to [facility]. Please notify patient.

## 2017-10-29 ENCOUNTER — Telehealth (INDEPENDENT_AMBULATORY_CARE_PROVIDER_SITE_OTHER): Payer: Self-pay | Admitting: Physician Assistant

## 2017-10-29 ENCOUNTER — Ambulatory Visit: Payer: Self-pay | Attending: Family Medicine

## 2017-10-29 NOTE — Telephone Encounter (Signed)
Patient states the her blood pressure medicine amLODipine (NORVASC) 5 MG tablet make her sleepy during the day and started to take her medication at night. Patient states she also take her water pills at night instead of the day and is waking up about every 15 minutes to go use the bathroom. patient states that when she takes amlodipine during the day it is very hard for her to keep her eyes open. Patient states she checked her blood pressure on Friday and it was 203, she also checked her blood pressure yesterday and it was 167/112 (possibly). Patient checked her blood pressure today after 30 minutes that she took 2 Spoons of white vinegar and her blood pressure was 145/102, but has not checked recently.  Patient wants to know if PCP can change her blood pressure medication to something that will not make her sleepy or that can be suitable for her.  Please Advice [phone]  Thank you [name]

## 2017-10-30 ENCOUNTER — Ambulatory Visit: Payer: Self-pay | Attending: Physician Assistant

## 2017-10-30 NOTE — Telephone Encounter (Signed)
Take amlodipine in the evening and HCTZ in the morning.

## 2017-10-31 NOTE — Telephone Encounter (Signed)
Patient aware. [name], CMA  

## 2017-11-01 MED FILL — PRAVASTATIN NA 40 MG TAB: 40 | 30 days supply | Qty: 30 | Fill #0

## 2017-11-28 MED FILL — PRAVASTATIN NA 40 MG TAB: 40 | 30 days supply | Qty: 30 | Fill #1

## 2017-11-28 MED FILL — HYDROCHLOROTHIAZIDE 12.5 MG: 12.5 | 30 days supply | Qty: 30 | Fill #1

## 2017-12-11 ENCOUNTER — Telehealth (INDEPENDENT_AMBULATORY_CARE_PROVIDER_SITE_OTHER): Payer: Self-pay | Admitting: Physician Assistant

## 2017-12-11 NOTE — Telephone Encounter (Signed)
Patient called requesting to talk to PCP. Front desk asked if she could help in any way and patient stated she wanted to personally talk to her PCP.  Please follow up [phone]  Thank you Emmit Pomfret

## 2017-12-11 NOTE — Telephone Encounter (Signed)
Patient is a patient here, she has 2 charts. Patient wanted to say thank you to PCP because BP/CHolesterol medications are working. Nat Christen, CMA

## 2017-12-11 NOTE — Telephone Encounter (Signed)
I have never seen this patient. Perhaps she is confusing me with another provider? Please call patient and see if she needs to be seen here or with her provider.

## 2017-12-25 MED FILL — ?AMLODIPINE BESYLATE 5 MG T: 5 MG | 30 days supply | Qty: 30 | Fill #1

## 2018-01-07 ENCOUNTER — Ambulatory Visit (INDEPENDENT_AMBULATORY_CARE_PROVIDER_SITE_OTHER): Payer: Self-pay | Admitting: Physician Assistant

## 2018-01-23 MED FILL — ?HYDROCHLOROTHIAZIDE 12.5MG: 12.5 | 30 days supply | Qty: 30 | Fill #2

## 2018-01-23 MED FILL — ?PRAVASTATIN NA 40 MG TAB: 40 | 30 days supply | Qty: 30 | Fill #2

## 2018-02-04 ENCOUNTER — Ambulatory Visit (INDEPENDENT_AMBULATORY_CARE_PROVIDER_SITE_OTHER): Payer: Self-pay | Admitting: Physician Assistant

## 2018-02-05 ENCOUNTER — Ambulatory Visit (INDEPENDENT_AMBULATORY_CARE_PROVIDER_SITE_OTHER): Payer: Self-pay | Admitting: Physician Assistant

## 2018-02-21 ENCOUNTER — Ambulatory Visit (INDEPENDENT_AMBULATORY_CARE_PROVIDER_SITE_OTHER): Payer: Self-pay | Admitting: Physician Assistant

## 2018-03-06 ENCOUNTER — Emergency Department (HOSPITAL_COMMUNITY)
Admission: EM | Admit: 2018-03-06 | Discharge: 2018-03-06 | Disposition: A | Discharge status: Home / Self Care | Payer: Self-pay | Attending: Emergency Medicine | Admitting: Emergency Medicine

## 2018-03-06 ENCOUNTER — Encounter (HOSPITAL_COMMUNITY): Payer: Self-pay | Admitting: Emergency Medicine

## 2018-03-06 ENCOUNTER — Emergency Department (HOSPITAL_COMMUNITY): Payer: Self-pay

## 2018-03-06 DIAGNOSIS — F1410 Cocaine abuse, uncomplicated: Secondary | ICD-10-CM | POA: Insufficient documentation

## 2018-03-06 DIAGNOSIS — I10 Essential (primary) hypertension: Secondary | ICD-10-CM | POA: Insufficient documentation

## 2018-03-06 DIAGNOSIS — J069 Acute upper respiratory infection, unspecified: Secondary | ICD-10-CM | POA: Insufficient documentation

## 2018-03-06 DIAGNOSIS — Z79899 Other long term (current) drug therapy: Secondary | ICD-10-CM | POA: Insufficient documentation

## 2018-03-06 LAB — CBC WITH DIFFERENTIAL/PLATELET
Abs Immature Granulocytes: 0.02 10*3/uL (ref 0.00–0.07)
Basophils Absolute: 0 10*3/uL (ref 0.0–0.1)
Basophils Relative: 1 %
Eosinophils Absolute: 0.1 10*3/uL (ref 0.0–0.5)
Eosinophils Relative: 2 %
HCT: 43.3 % (ref 36.0–46.0)
Hemoglobin: 13.9 g/dL (ref 12.0–15.0)
Immature Granulocytes: 1 %
Lymphocytes Relative: 45 %
Lymphs Abs: 1.9 10*3/uL (ref 0.7–4.0)
MCH: 29.7 pg (ref 26.0–34.0)
MCHC: 32.1 g/dL (ref 30.0–36.0)
MCV: 92.5 fL (ref 80.0–100.0)
MONO ABS: 0.7 10*3/uL (ref 0.1–1.0)
Monocytes Relative: 17 %
Neutro Abs: 1.4 10*3/uL — ABNORMAL LOW (ref 1.7–7.7)
Neutrophils Relative %: 34 %
Platelets: 211 10*3/uL (ref 150–400)
RBC: 4.68 MIL/uL (ref 3.87–5.11)
RDW: 12.8 % (ref 11.5–15.5)
WBC: 4.1 10*3/uL (ref 4.0–10.5)
nRBC: 0 % (ref 0.0–0.2)

## 2018-03-06 LAB — URINALYSIS, ROUTINE W REFLEX MICROSCOPIC
Bilirubin Urine: NEGATIVE
Glucose, UA: NEGATIVE mg/dL
Hgb urine dipstick: NEGATIVE
Ketones, ur: NEGATIVE mg/dL
Nitrite: NEGATIVE
PROTEIN: NEGATIVE mg/dL
Specific Gravity, Urine: 1.021 (ref 1.005–1.030)
pH: 5 (ref 5.0–8.0)

## 2018-03-06 LAB — COMPREHENSIVE METABOLIC PANEL
ALT: 20 U/L (ref 0–44)
AST: 24 U/L (ref 15–41)
Albumin: 4.2 g/dL (ref 3.5–5.0)
Alkaline Phosphatase: 35 U/L — ABNORMAL LOW (ref 38–126)
Anion gap: 12 (ref 5–15)
BUN: 15 mg/dL (ref 8–23)
CO2: 26 mmol/L (ref 22–32)
CREATININE: 0.94 mg/dL (ref 0.44–1.00)
Calcium: 9.3 mg/dL (ref 8.9–10.3)
Chloride: 100 mmol/L (ref 98–111)
GFR calc non Af Amer: >60 mL/min (ref >60)
Glucose, Bld: 115 mg/dL — ABNORMAL HIGH (ref 70–99)
Potassium: 3.5 mmol/L (ref 3.5–5.1)
Sodium: 138 mmol/L (ref 135–145)
Total Bilirubin: 0.4 mg/dL (ref 0.3–1.2)
Total Protein: 8 g/dL (ref 6.5–8.1)

## 2018-03-06 LAB — LIPASE, BLOOD: Lipase: 38 U/L (ref 11–51)

## 2018-03-06 MED ORDER — BENZONATATE 100 MG PO CAPS: 100.0000 mg | ORAL_CAPSULE | Freq: Three times a day (TID) | ORAL | 0 refills | Status: DC

## 2018-03-06 MED FILL — ?AMLODIPINE BESYLATE 5 MG T: 5 MG | 30 days supply | Qty: 30 | Fill #2

## 2018-03-06 NOTE — ED Provider Notes (Signed)
[facility] DEPT Provider Note   CSN: [id number] Arrival date & time: 03/06/18  4098     History   Chief Complaint Chief Complaint  Patient presents with  . Cough  . Chills    HPI [name] is a 62 y.o. female who presents to the ED with cough, chills, fever, nausea and abdominal pain. The symptoms started 3 days ago and have not improved with OTC medications. Patient reports her BP is high but she is out of one of her medications and will get it filled today.   The history is provided by the patient. No language interpreter was used.  Cough  This is a new problem. The current episode started more than 2 days ago. The problem has been gradually worsening. The cough is non-productive. The maximum temperature recorded prior to her arrival was 101 to 101.9 F. The fever has been present for 1 to 2 days. Associated symptoms include chills, sweats, ear congestion, ear pain, rhinorrhea and myalgias. Pertinent negatives include no chest pain, no headaches, no sore throat, no shortness of breath and no wheezing. She has tried decongestants for the symptoms. The treatment provided mild relief. She is not a smoker. Her past medical history is significant for pneumonia.    Past Medical History:  Diagnosis Date  . Hx of blood clots 1993   previously on Coumadin x 3 months in the past   . Hyperlipidemia   . Hypertension     Patient Active Problem List   Diagnosis Date Noted  . Acute kidney injury ([place]) 07/02/2012  . Unspecified essential hypertension 07/01/2012  . Cocaine abuse ([place]) 06/07/2012  . Acute URI 06/07/2012  . Hypertensive urgency 06/06/2012    History reviewed. No pertinent surgical history.   OB History   None      Home Medications    Prior to Admission medications   Medication Sig Start Date End Date Taking? Authorizing Provider  acetaminophen (TYLENOL) 500 MG tablet Take 1,000 mg by mouth daily as needed for mild pain.   Yes  [provider]  ALOE VERA PO Take by mouth.   Yes [provider]  amLODipine (NORVASC) 5 MG tablet Take 1 tablet (5 mg total) by mouth daily. 09/24/17  Yes Clent Demark, PA-C  Ascorbic Acid (VITAMIN C PO) Take by mouth.   Yes [provider]  cetirizine (ZYRTEC) 10 MG tablet Take 10 mg by mouth daily.   Yes [provider]  Cyanocobalamin (VITAMIN B 12 PO) Take by mouth.   Yes [provider]  ECHINACEA PO Take by mouth.   Yes [provider]  hydrochlorothiazide (HYDRODIURIL) 12.5 MG tablet Take 1 tablet (12.5 mg total) by mouth daily. 09/24/17  Yes Clent Demark, PA-C  pravastatin (PRAVACHOL) 40 MG tablet Take 1 tablet (40 mg total) by mouth daily. 09/24/17  Yes Clent Demark, PA-C  TURMERIC PO Take by mouth.   Yes [provider]  VITAMIN D PO Take by mouth.   Yes [provider]  benzonatate (TESSALON) 100 MG capsule Take 1 capsule (100 mg total) by mouth every 8 (eight) hours. 03/06/18   Ashley Murrain, NP    Family History No family history on file.  Social History Social History   Tobacco Use  . Smoking status: Never Smoker  . Smokeless tobacco: Never Used  Substance Use Topics  . Alcohol use: Yes    Alcohol/week: 2.0 standard drinks    Types: 2  Cans of beer per week  . Drug use: No     Allergies   Lisinopril   Review of Systems Review of Systems  Constitutional: Positive for chills and fever.  HENT: Positive for congestion, ear pain and rhinorrhea. Negative for sinus pressure, sinus pain, sore throat and trouble swallowing.   Eyes: Positive for itching.  Respiratory: Positive for cough. Negative for shortness of breath and wheezing.   Cardiovascular: Negative for chest pain.  Gastrointestinal: Positive for nausea. Negative for abdominal pain, diarrhea and vomiting.  Genitourinary: Negative for decreased urine volume, dysuria and frequency.  Musculoskeletal: Positive for myalgias.    Skin: Negative for rash.  Neurological: Negative for headaches.  Hematological: Negative for adenopathy.  Psychiatric/Behavioral: Negative for confusion.     Physical Exam Updated Vital Signs BP (!) 120/99   Pulse 80   Temp 97.8 F (36.6 C) (Oral)   Resp 16   SpO2 97%   Physical Exam  Constitutional: She appears well-developed and well-nourished. No distress.  HENT:  Head: Normocephalic and atraumatic.  Right Ear: Tympanic membrane normal.  Left Ear: Tympanic membrane normal.  Nose: Rhinorrhea present.  Mouth/Throat: Uvula is midline and mucous membranes are normal. Posterior oropharyngeal erythema: mild.  Eyes: Conjunctivae and EOM are normal.  Neck: Neck supple.  Cardiovascular: Normal rate and regular rhythm.  Pulmonary/Chest: Effort normal. She has no wheezes. She has no rales.  Abdominal: Soft. There is no tenderness.  Musculoskeletal: Normal range of motion.  Neurological: She is alert.  Skin: Skin is warm and dry.  Psychiatric: She has a normal mood and affect. Her behavior is normal.  Nursing note and vitals reviewed.    ED Treatments / Results  Labs (all labs ordered are listed, but only abnormal results are displayed) Labs Reviewed  CBC WITH DIFFERENTIAL/PLATELET - Abnormal; Notable for the following components:      Result Value   Neutro Abs 1.4 (*)    All other components within normal limits  COMPREHENSIVE METABOLIC PANEL - Abnormal; Notable for the following components:   Glucose, Bld 115 (*)    Alkaline Phosphatase 35 (*)    All other components within normal limits  URINALYSIS, ROUTINE W REFLEX MICROSCOPIC - Abnormal; Notable for the following components:   APPearance HAZY (*)    Leukocytes, UA TRACE (*)    Bacteria, UA RARE (*)    All other components within normal limits  LIPASE, BLOOD    Radiology Dg Chest 2 View  Result Date: 03/06/2018 CLINICAL DATA:  Cough, fever and bilateral rib pain. EXAM: CHEST - 2 VIEW COMPARISON:  06/06/2012  FINDINGS: The heart is enlarged but stable. Stable tortuosity of the thoracic aorta. The pulmonary hila appear normal in stable. Low lung volumes with vascular crowding and streaky bibasilar atelectasis. No infiltrates, edema or effusions. The bony thorax is intact. IMPRESSION: 1. Stable cardiac enlargement. 2. Low lung volumes with vascular crowding and streaky basilar atelectasis but no infiltrates, edema or effusions. Electronically Signed   By: Marijo Sanes M.D.   On: 03/06/2018 10:10    Procedures Procedures (including critical care time)  Medications Ordered in ED Medications - No data to display   Initial Impression / Assessment and Plan / ED Course  I have reviewed the triage vital signs and the nursing notes. Pt CXR negative for acute infiltrate. Patients symptoms are consistent with URI, likely viral etiology. Discussed that antibiotics are not indicated for viral infections. Pt will be discharged with symptomatic treatment.  Verbalizes understanding and is  agreeable with plan. Pt is hemodynamically stable & in NAD prior to dc.  Final Clinical Impressions(s) / ED Diagnoses   Final diagnoses:  Acute URI    ED Discharge Orders         Ordered    benzonatate (TESSALON) 100 MG capsule  Every 8 hours     03/06/18 1426           Janit Bern Angier, NP 03/06/18 1815    Tegeler, Gwenyth Allegra, MD 03/06/18 2004

## 2018-03-06 NOTE — ED Triage Notes (Signed)
Pt c/o chills, cough, since Sunday. reports works at [employer]. Cough is non productive and painful on bilat ribs when coughs. Pt reports that run out of HTN medications and needs refill.

## 2018-03-06 NOTE — Discharge Instructions (Addendum)
Take tylenol as needed for fever and body aches. Follow up with your doctor. Get your blood pressure medication filled.

## 2018-03-20 MED FILL — ?HYDROCHLOROTHIAZIDE 12.5MG: 12.5 | 30 days supply | Qty: 30 | Fill #3

## 2018-03-20 MED FILL — ?PRAVASTATIN NA 40 MG TAB: 40 | 30 days supply | Qty: 30 | Fill #3

## 2018-04-30 MED FILL — AMLODIPINE BESYLATE 5 MG TA: 5 | 30 days supply | Qty: 30 | Fill #3

## 2018-05-20 MED FILL — ?PRAVASTATIN NA 40 MG TAB: 40 | 30 days supply | Qty: 30 | Fill #4

## 2018-05-20 MED FILL — HYDROCHLOROTHIAZIDE 12.5 MG: 12.5 | 30 days supply | Qty: 30 | Fill #4

## 2018-07-02 MED FILL — ?PRAVASTATIN NA 40 MG TAB: 40 | 30 days supply | Qty: 30 | Fill #5

## 2018-07-02 MED FILL — HYDROCHLOROTHIAZIDE 12.5 MG: 12.5 | 30 days supply | Qty: 30 | Fill #5

## 2018-07-05 ENCOUNTER — Other Ambulatory Visit: Payer: Self-pay

## 2018-07-05 DIAGNOSIS — I10 Essential (primary) hypertension: Secondary | ICD-10-CM

## 2018-07-05 MED ORDER — AMLODIPINE BESYLATE 5 MG PO TABS: 5.0000 mg | ORAL_TABLET | Freq: Every day | ORAL | 5 refills | Status: DC

## 2018-07-05 NOTE — Telephone Encounter (Signed)
Due to pharmacy issue patient is requesting BP meds sent to [facility] on [address]. I am sending a total of 6 refills which is what is left with [facility] pharmacy currently.

## 2018-07-26 MED FILL — ?PRAVASTATIN NA 40 MG TAB: 40 | 30 days supply | Qty: 30 | Fill #6

## 2018-07-26 MED FILL — HYDROCHLOROTHIAZIDE 12.5 MG: 12.5 | 30 days supply | Qty: 30 | Fill #6

## 2018-08-09 MED FILL — ?AMLODIPINE BESYLATE 5MG TA: 5 | 30 days supply | Qty: 30 | Fill #0

## 2018-09-30 ENCOUNTER — Ambulatory Visit (INDEPENDENT_AMBULATORY_CARE_PROVIDER_SITE_OTHER): Payer: Self-pay | Admitting: Primary Care

## 2018-10-07 ENCOUNTER — Other Ambulatory Visit: Payer: Self-pay

## 2018-10-07 ENCOUNTER — Ambulatory Visit (INDEPENDENT_AMBULATORY_CARE_PROVIDER_SITE_OTHER): Payer: Self-pay | Admitting: Primary Care

## 2018-10-07 DIAGNOSIS — E782 Mixed hyperlipidemia: Secondary | ICD-10-CM

## 2018-10-07 DIAGNOSIS — Z760 Encounter for issue of repeat prescription: Secondary | ICD-10-CM

## 2018-10-07 DIAGNOSIS — I10 Essential (primary) hypertension: Secondary | ICD-10-CM

## 2018-10-07 MED ORDER — PRAVASTATIN SODIUM 40 MG PO TABS: 40.0000 mg | ORAL_TABLET | Freq: Every day | ORAL | 3 refills | Status: DC

## 2018-10-07 MED ORDER — HYDROCHLOROTHIAZIDE 12.5 MG PO TABS: 12.5000 mg | ORAL_TABLET | Freq: Every day | ORAL | 3 refills | Status: DC

## 2018-10-07 MED ORDER — AMLODIPINE BESYLATE 5 MG PO TABS: 5.0000 mg | ORAL_TABLET | Freq: Every day | ORAL | 3 refills | Status: DC

## 2018-10-07 MED FILL — ?AMLODIPINE BESYLATE 5MG TA: 5 | 30 days supply | Qty: 30 | Fill #0

## 2018-10-07 MED FILL — ?PRAVASTATIN NA 40 MG TAB: 40 | 30 days supply | Qty: 30 | Fill #0

## 2018-10-07 MED FILL — HYDROCHLOROTHIAZIDE 12.5 MG: 12.5 | 30 days supply | Qty: 30 | Fill #0

## 2018-10-07 NOTE — Progress Notes (Signed)
Virtual Visit via Telephone Note  I connected with [name] on 10/07/18 at  3:30 PM EDT by telephone and verified that I am speaking with the correct person using two identifiers.   I discussed the limitations, risks, security and privacy concerns of performing an evaluation and management service by telephone and the availability of in person appointments. I also discussed with the patient that there may be a patient responsible charge related to this service. The patient expressed understanding and agreed to proceed.   History of Present Illness: [name] is having a tele visit for medication refills to include her high blood reading which were not at all controlled ranged 400-867 systolic and 61-950 diastolic . She did say her monitor is old and may not be correct Denies shortness of breath, headaches, chest pain or lower extremity edema     Observations/Objective: Review of Systems  All other systems reviewed and are negative.   Assessment and Plan: [name] was seen today for establish care and medication refill.  Diagnoses and all orders for this visit:  Mixed hyperlipidemia  Healthy lifestyle diet of fruits vegetables fish nuts whole grains and low saturated fat . Foods high in cholesterol or liver, fatty meats,cheese, butter avocados, nuts and seeds, chocolate and fried foods.  Essential hypertension Counseled on blood pressure goal of less than 130/80, low-sodium, DASH diet, medication compliance, 150 minutes of moderate intensity exercise per week. Discussed medication compliance, adverse effects. - Medication refill      pravastatin (PRAVACHOL) 40 MG tablet; Take 1 tablet (40 mg total) by mouth daily. -     hydrochlorothiazide (HYDRODIURIL) 12.5 MG tablet; Take 1 tablet (12.5 mg total) by mouth daily. -     amLODipine (NORVASC) 5 MG tablet; Take 1 tablet (5 mg total) by mouth daily.   Follow Up Instructions:    I discussed the assessment and treatment plan  with the patient. The patient was provided an opportunity to ask questions and all were answered. The patient agreed with the plan and demonstrated an understanding of the instructions.   The patient was advised to call back or seek an in-person evaluation if the symptoms worsen or if the condition fails to improve as anticipated.  I provided 15 minutes of non-face-to-face time during this encounter.   Kerin Perna, NP

## 2018-10-07 NOTE — Progress Notes (Signed)
estab

## 2018-10-10 ENCOUNTER — Other Ambulatory Visit: Payer: Self-pay

## 2018-10-10 ENCOUNTER — Ambulatory Visit (INDEPENDENT_AMBULATORY_CARE_PROVIDER_SITE_OTHER): Payer: Self-pay | Admitting: Primary Care

## 2018-10-10 ENCOUNTER — Encounter (INDEPENDENT_AMBULATORY_CARE_PROVIDER_SITE_OTHER): Payer: Self-pay | Admitting: Primary Care

## 2018-10-10 VITALS — BP 152/96 | HR 73 | Temp 97.8°F | Ht 66.5 in | Wt 203.8 lb

## 2018-10-10 DIAGNOSIS — Z1211 Encounter for screening for malignant neoplasm of colon: Secondary | ICD-10-CM

## 2018-10-10 DIAGNOSIS — I10 Essential (primary) hypertension: Secondary | ICD-10-CM

## 2018-10-10 DIAGNOSIS — Z0000 Encounter for general adult medical examination without abnormal findings: Secondary | ICD-10-CM

## 2018-10-10 DIAGNOSIS — Z23 Encounter for immunization: Secondary | ICD-10-CM

## 2018-10-10 DIAGNOSIS — R7303 Prediabetes: Secondary | ICD-10-CM

## 2018-10-10 LAB — POCT GLYCOSYLATED HEMOGLOBIN (HGB A1C): Hemoglobin A1C: 5.6 % (ref 4.0–5.6)

## 2018-10-10 MED ORDER — AMLODIPINE BESYLATE 10 MG PO TABS: 10.0000 mg | ORAL_TABLET | Freq: Every day | ORAL | 3 refills | Status: DC

## 2018-10-10 NOTE — Patient Instructions (Signed)

## 2018-10-10 NOTE — Progress Notes (Signed)
Established Patient Office Visit  Subjective:  Patient ID: [name], female    DOB: May 25, 1955  Age: 63 y.o. MRN: [id number]  CC:  Chief Complaint  Patient presents with  . Annual Exam    HPI [name] presents for annual physical on arrival blood pressure is elevated but denies  shortness of breath, headaches, chest pain or lower extremity edema. She has a PMH to include noncompliance. See below for all PMI.  Past Medical History:  Diagnosis Date  . Hx of blood clots 1993   previously on Coumadin x 3 months in the past   . Hyperlipidemia   . Hypertension     History reviewed. No pertinent surgical history.  History reviewed. No pertinent family history.  Social History   Socioeconomic History  . Marital status: Single    Spouse name: Not on file  . Number of children: Not on file  . Years of education: Not on file  . Highest education level: Not on file  Occupational History  . Not on file  Social Needs  . Financial resource strain: Not on file  . Food insecurity    Worry: Not on file    Inability: Not on file  . Transportation needs    Medical: Not on file    Non-medical: Not on file  Tobacco Use  . Smoking status: Never Smoker  . Smokeless tobacco: Never Used  Substance and Sexual Activity  . Alcohol use: Yes    Alcohol/week: 2.0 standard drinks    Types: 2 Cans of beer per week  . Drug use: No  . Sexual activity: Not Currently  Lifestyle  . Physical activity    Days per week: Not on file    Minutes per session: Not on file  . Stress: Not on file  Relationships  . Social [employer] on phone: Not on file    Gets together: Not on file    Attends religious service: Not on file    Active member of club or organization: Not on file    Attends meetings of clubs or organizations: Not on file    Relationship status: Not on file  . Intimate partner violence    Fear of current or ex partner: Not on file    Emotionally abused:  Not on file    Physically abused: Not on file    Forced sexual activity: Not on file  Other Topics Concern  . Not on file  Social History Narrative  . Not on file    Outpatient Medications Prior to Visit  Medication Sig Dispense Refill  . ALOE VERA PO Take by mouth.    . Ascorbic Acid (VITAMIN C PO) Take by mouth.    . Cyanocobalamin (VITAMIN B 12 PO) Take by mouth.    . ECHINACEA PO Take by mouth.    . hydrochlorothiazide (HYDRODIURIL) 12.5 MG tablet Take 1 tablet (12.5 mg total) by mouth daily. 30 tablet 3  . pravastatin (PRAVACHOL) 40 MG tablet Take 1 tablet (40 mg total) by mouth daily. 30 tablet 3  . TURMERIC PO Take by mouth.    Marland Kitchen VITAMIN D PO Take by mouth.    Marland Kitchen amLODipine (NORVASC) 5 MG tablet Take 1 tablet (5 mg total) by mouth daily. 30 tablet 3  . acetaminophen (TYLENOL) 500 MG tablet Take 1,000 mg by mouth daily as needed for mild pain.     No facility-administered medications prior to visit.  Allergies  Allergen Reactions  . Lisinopril     Suspect ACE inhibitor allergy as patient creatinine nearly doubled after initiation of lisinopril    ROS Review of Systems  All other systems reviewed and are negative.     Objective:    Physical Exam  Constitutional: She is oriented to person, place, and time. She appears well-developed and well-nourished.  HENT:  Head: Normocephalic.  Eyes: Pupils are equal, round, and reactive to light. EOM are normal.  Neck: Normal range of motion.  Cardiovascular: Normal rate and regular rhythm.  Abdominal: Soft. Bowel sounds are normal. She exhibits distension.  Musculoskeletal: Normal range of motion.  Neurological: She is oriented to person, place, and time.  Skin: Skin is warm and dry.  Psychiatric: She has a normal mood and affect.  Sensory exam of the foot is normal, tested with the monofilament. Good pulses, no lesions or ulcers, good peripheral pulses.  BP (!) 152/96 (BP Location: Left Arm, Patient Position: Sitting,  Cuff Size: Normal)   Pulse 73   Temp 97.8 F (36.6 C) (Oral)   Ht 5' 6.5" (1.689 m)   Wt 203 lb 12.8 oz (92.4 kg)   SpO2 97%   BMI 32.40 kg/m  Wt Readings from Last 3 Encounters:  10/10/18 203 lb 12.8 oz (92.4 kg)  08/30/17 195 lb 3.2 oz (88.5 kg)  07/01/12 196 lb 12.8 oz (89.3 kg)     Health Maintenance Due  Topic Date Due  . Hepatitis C Screening  15-Feb-1956  . PNEUMOCOCCAL POLYSACCHARIDE VACCINE AGE 23-64 HIGH RISK  06/21/1957  . OPHTHALMOLOGY EXAM  06/21/1965  . URINE MICROALBUMIN  06/21/1965  . HIV Screening  06/22/1970  . TETANUS/TDAP  06/22/1974  . PAP SMEAR-Modifier  06/21/1976  . MAMMOGRAM  06/21/2005  . COLONOSCOPY  06/21/2005  . HEMOGLOBIN A1C  03/02/2018    There are no preventive care reminders to display for this patient.  Lab Results  Component Value Date   TSH 1.800 08/30/2017   Lab Results  Component Value Date   WBC 4.1 03/06/2018   HGB 13.9 03/06/2018   HCT 43.3 03/06/2018   MCV 92.5 03/06/2018   PLT 211 03/06/2018   Lab Results  Component Value Date   NA 138 03/06/2018   K 3.5 03/06/2018   CO2 26 03/06/2018   GLUCOSE 115 (H) 03/06/2018   BUN 15 03/06/2018   CREATININE 0.94 03/06/2018   BILITOT 0.4 03/06/2018   ALKPHOS 35 (L) 03/06/2018   AST 24 03/06/2018   ALT 20 03/06/2018   PROT 8.0 03/06/2018   ALBUMIN 4.2 03/06/2018   CALCIUM 9.3 03/06/2018   ANIONGAP 12 03/06/2018   Lab Results  Component Value Date   CHOL 260 (H) 08/30/2017   Lab Results  Component Value Date   HDL 55 08/30/2017   Lab Results  Component Value Date   LDLCALC 169 (H) 08/30/2017   Lab Results  Component Value Date   TRIG 180 (H) 08/30/2017   Lab Results  Component Value Date   CHOLHDL 4.7 (H) 08/30/2017   Lab Results  Component Value Date   HGBA1C 5.6 10/10/2018      Assessment & Plan:   [name] was seen today for annual exam.  Diagnoses and all orders for this visit:  Need for Tdap vaccination -     Cancel: Tdap vaccine greater than or  equal to 7yo IM -     Fecal occult blood, imunochemical; Future  Prediabetes -     HgB  A1c 5.6 -     Microalbumin/Creatinine Ratio, Urine  Prediabetes is 5.7-6.4 .Foods that are high in carbohydrates are the following rice, potatoes, breads, sugars, and pastas.  Reduction in the intake (eating) will assist in lowering your blood sugars.  Special screening for malignant neoplasms, colon Normal colon cancer screening.  CDC recommends colorectal screening from ages 12-75. Screening can begin at 73 or earlier in some cases. This screening is used for a disease when no symptoms are present . Diagnostic test is used for symptoms examples blood in stool, colorectal polyps or coloector cancer, family history or inflammatory bowel disease - chron's or ulcerative colitis .(USPSTF) -     Cancel: Fecal occult blood, imunochemical([facility]); Future -     Fecal occult blood, imunochemical; Future  Healthcare maintenance -     Hepatitis C Antibody -     HIV Antibody (routine testing w rflx)  Essential hypertension Counseled on blood pressure goal of less than 130/80, low-sodium, DASH diet, medication compliance, 150 minutes of moderate intensity exercise per week. Discussed medication compliance, adverse effects. -     amLODipine (NORVASC) 10 MG tablet; Take 1 tablet (10 mg total) by mouth daily.  Other orders -     Pneumococcal conjugate vaccine 13-valent -     Tdap vaccine greater than or equal to 7yo IM   Follow-up: Return in about 3 months (around 01/10/2019) for HTN .    Kerin Perna, NP

## 2018-10-11 LAB — HIV ANTIBODY (ROUTINE TESTING W REFLEX): HIV Screen 4th Generation wRfx: NONREACTIVE

## 2018-10-11 LAB — MICROALBUMIN / CREATININE URINE RATIO
Creatinine, Urine: 15.8 mg/dL
Microalb/Creat Ratio: <19 mg/g creat (ref 0–29)
Microalbumin, Urine: <3 ug/mL

## 2018-10-11 LAB — HEPATITIS C ANTIBODY: Hep C Virus Ab: <0.1 s/co ratio (ref 0.0–0.9)

## 2018-10-14 ENCOUNTER — Telehealth (INDEPENDENT_AMBULATORY_CARE_PROVIDER_SITE_OTHER): Payer: Self-pay

## 2018-10-14 NOTE — Telephone Encounter (Signed)
-----   Message from Kerin Perna, NP sent at 10/11/2018  9:19 PM EDT ----- Please let patient know hepatitis C and HIV are both negative

## 2018-10-14 NOTE — Telephone Encounter (Signed)
Left voicemail notifying patient that HIV and Hep C were negative and microalbumin was normal. Return call to [facility] at [phone] with any questions or concerns. Nat Christen, CMA

## 2018-10-21 ENCOUNTER — Telehealth (HOSPITAL_COMMUNITY): Payer: Self-pay

## 2018-10-21 NOTE — Telephone Encounter (Signed)
Telephoned patient at home number. Left message with [facility] info to call and schedule appointment

## 2018-10-25 ENCOUNTER — Other Ambulatory Visit (HOSPITAL_COMMUNITY): Payer: Self-pay | Admitting: *Deleted

## 2018-10-25 DIAGNOSIS — Z1231 Encounter for screening mammogram for malignant neoplasm of breast: Secondary | ICD-10-CM

## 2018-11-05 MED FILL — HYDROCHLOROTHIAZIDE 12.5 MG: 12.5 | 30 days supply | Qty: 30 | Fill #1

## 2018-11-05 MED FILL — AMLODIPINE BESYLATE 5 MG TA: 5 | 30 days supply | Qty: 30 | Fill #1

## 2018-11-05 MED FILL — PRAVASTATIN NA 40 MG TAB: 40 | 30 days supply | Qty: 30 | Fill #1

## 2018-11-07 ENCOUNTER — Ambulatory Visit (INDEPENDENT_AMBULATORY_CARE_PROVIDER_SITE_OTHER): Payer: Self-pay | Admitting: Primary Care

## 2018-11-07 ENCOUNTER — Other Ambulatory Visit: Payer: Self-pay

## 2018-11-07 ENCOUNTER — Encounter (INDEPENDENT_AMBULATORY_CARE_PROVIDER_SITE_OTHER): Payer: Self-pay | Admitting: Primary Care

## 2018-11-07 VITALS — BP 137/92 | HR 77 | Temp 97.6°F | Ht 66.5 in | Wt 204.2 lb

## 2018-11-07 DIAGNOSIS — I10 Essential (primary) hypertension: Secondary | ICD-10-CM

## 2018-11-07 DIAGNOSIS — Z1211 Encounter for screening for malignant neoplasm of colon: Secondary | ICD-10-CM

## 2018-11-07 MED FILL — ?AMLODIPINE BESYLATE 10 MG: 10 | 30 days supply | Qty: 30 | Fill #0

## 2018-11-07 NOTE — Patient Instructions (Signed)

## 2018-11-07 NOTE — Progress Notes (Signed)
Established Patient Office Visit  Subjective:  Patient ID: [name], female    DOB: Jun 12, 1955  Age: 63 y.o. MRN: [id number]  CC: No chief complaint on file.   HPI [name] presents for blood pressure follow up. Blood pressures has vastly improved but we are striving for goal <130/80. She denies shortness of breath, headaches, chest pain or lower extremity edema.  Past Medical History:  Diagnosis Date  . Hx of blood clots 1993   previously on Coumadin x 3 months in the past   . Hyperlipidemia   . Hypertension     History reviewed. No pertinent surgical history.  History reviewed. No pertinent family history.  Social History   Socioeconomic History  . Marital status: Single    Spouse name: Not on file  . Number of children: Not on file  . Years of education: Not on file  . Highest education level: Not on file  Occupational History  . Not on file  Social Needs  . Financial resource strain: Not on file  . Food insecurity    Worry: Not on file    Inability: Not on file  . Transportation needs    Medical: Not on file    Non-medical: Not on file  Tobacco Use  . Smoking status: Never Smoker  . Smokeless tobacco: Never Used  Substance and Sexual Activity  . Alcohol use: Yes    Alcohol/week: 2.0 standard drinks    Types: 2 Cans of beer per week  . Drug use: No  . Sexual activity: Not Currently  Lifestyle  . Physical activity    Days per week: Not on file    Minutes per session: Not on file  . Stress: Not on file  Relationships  . Social Herbalist on phone: Not on file    Gets together: Not on file    Attends religious service: Not on file    Active member of club or organization: Not on file    Attends meetings of clubs or organizations: Not on file    Relationship status: Not on file  . Intimate partner violence    Fear of current or ex partner: Not on file    Emotionally abused: Not on file    Physically abused: Not on file   Forced sexual activity: Not on file  Other Topics Concern  . Not on file  Social History Narrative  . Not on file    Outpatient Medications Prior to Visit  Medication Sig Dispense Refill  . ALOE VERA PO Take by mouth.    Marland Kitchen amLODipine (NORVASC) 10 MG tablet Take 1 tablet (10 mg total) by mouth daily. 30 tablet 3  . Ascorbic Acid (VITAMIN C PO) Take by mouth.    . Cyanocobalamin (VITAMIN B 12 PO) Take by mouth.    . ECHINACEA PO Take by mouth.    . hydrochlorothiazide (HYDRODIURIL) 12.5 MG tablet Take 1 tablet (12.5 mg total) by mouth daily. 30 tablet 3  . pravastatin (PRAVACHOL) 40 MG tablet Take 1 tablet (40 mg total) by mouth daily. 30 tablet 3  . TURMERIC PO Take by mouth.    Marland Kitchen VITAMIN D PO Take by mouth.    Marland Kitchen acetaminophen (TYLENOL) 500 MG tablet Take 1,000 mg by mouth daily as needed for mild pain.     No facility-administered medications prior to visit.     Allergies  Allergen Reactions  . Lisinopril     Suspect ACE  inhibitor allergy as patient creatinine nearly doubled after initiation of lisinopril    ROS Review of Systems    Objective:    Physical Exam  BP (!) 137/92 (BP Location: Left Arm, Patient Position: Sitting, Cuff Size: Normal)   Pulse 77   Temp 97.6 F (36.4 C) (Tympanic)   Ht 5' 6.5" (1.689 m)   Wt 204 lb 3.2 oz (92.6 kg)   SpO2 97%   BMI 32.46 kg/m  Wt Readings from Last 3 Encounters:  11/07/18 204 lb 3.2 oz (92.6 kg)  10/10/18 203 lb 12.8 oz (92.4 kg)  08/30/17 195 lb 3.2 oz (88.5 kg)     Health Maintenance Due  Topic Date Due  . PNEUMOCOCCAL POLYSACCHARIDE VACCINE AGE 58-64 HIGH RISK  06/21/1957  . OPHTHALMOLOGY EXAM  06/21/1965  . PAP SMEAR-Modifier  06/21/1976  . MAMMOGRAM  06/21/2005  . COLONOSCOPY  06/21/2005  . INFLUENZA VACCINE  11/02/2018    There are no preventive care reminders to display for this patient.  Lab Results  Component Value Date   TSH 1.800 08/30/2017   Lab Results  Component Value Date   WBC 4.1  03/06/2018   HGB 13.9 03/06/2018   HCT 43.3 03/06/2018   MCV 92.5 03/06/2018   PLT 211 03/06/2018   Lab Results  Component Value Date   NA 138 03/06/2018   K 3.5 03/06/2018   CO2 26 03/06/2018   GLUCOSE 115 (H) 03/06/2018   BUN 15 03/06/2018   CREATININE 0.94 03/06/2018   BILITOT 0.4 03/06/2018   ALKPHOS 35 (L) 03/06/2018   AST 24 03/06/2018   ALT 20 03/06/2018   PROT 8.0 03/06/2018   ALBUMIN 4.2 03/06/2018   CALCIUM 9.3 03/06/2018   ANIONGAP 12 03/06/2018   Lab Results  Component Value Date   CHOL 260 (H) 08/30/2017   Lab Results  Component Value Date   HDL 55 08/30/2017   Lab Results  Component Value Date   LDLCALC 169 (H) 08/30/2017   Lab Results  Component Value Date   TRIG 180 (H) 08/30/2017   Lab Results  Component Value Date   CHOLHDL 4.7 (H) 08/30/2017   Lab Results  Component Value Date   HGBA1C 5.6 10/10/2018      Assessment & Plan:   Problem List Items Addressed This Visit    None    Visit Diagnoses    Essential hypertension    -  Primary   Special screening for malignant neoplasms, colon         Diagnoses and all orders for this visit:  Essential hypertension Counseled on blood pressure goal of less than 130/80, low-sodium, DASH diet, medication compliance, 150 minutes of moderate intensity exercise per week. Discussed medication compliance, adverse effects. No orders of the defined types were placed in this encounter. .  Follow-up: Return in about 3 months (around 02/07/2019) for HTN.    Kerin Perna, NP

## 2018-11-08 LAB — FECAL OCCULT BLOOD, IMMUNOCHEMICAL: Fecal Occult Bld: NEGATIVE

## 2018-11-11 ENCOUNTER — Telehealth (INDEPENDENT_AMBULATORY_CARE_PROVIDER_SITE_OTHER): Payer: Self-pay

## 2018-11-11 NOTE — Telephone Encounter (Signed)
Per DPR left patient a voicemail notifying her that the FIT was negative(no blood found). Asked patient to return call to [facility] at [phone] with any questions or concerns. Nat Christen, CMA

## 2018-11-11 NOTE — Telephone Encounter (Signed)
-----   Message from Kerin Perna, NP sent at 11/09/2018 10:43 AM EDT ----- FOBT is negative

## 2019-01-14 MED FILL — PRAVASTATIN NA 40 MG TAB: 40 | 30 days supply | Qty: 30 | Fill #2

## 2019-01-14 MED FILL — AMLODIPINE BESYLATE 10 MG T: 10 | 30 days supply | Qty: 30 | Fill #1

## 2019-01-14 MED FILL — HYDROCHLOROTHIAZIDE 12.5 MG: 12.5 | 30 days supply | Qty: 30 | Fill #2

## 2019-01-23 ENCOUNTER — Ambulatory Visit (HOSPITAL_COMMUNITY): Payer: Self-pay

## 2019-02-06 ENCOUNTER — Ambulatory Visit (INDEPENDENT_AMBULATORY_CARE_PROVIDER_SITE_OTHER): Payer: Self-pay | Admitting: Primary Care

## 2019-02-20 ENCOUNTER — Ambulatory Visit (INDEPENDENT_AMBULATORY_CARE_PROVIDER_SITE_OTHER): Payer: Self-pay | Admitting: Primary Care

## 2019-03-10 MED FILL — AMLODIPINE BESYLATE 10 MG T: 10 | 30 days supply | Qty: 30 | Fill #2

## 2019-04-22 ENCOUNTER — Ambulatory Visit (INDEPENDENT_AMBULATORY_CARE_PROVIDER_SITE_OTHER): Payer: Self-pay | Admitting: Primary Care

## 2019-04-22 ENCOUNTER — Other Ambulatory Visit: Payer: Self-pay

## 2019-04-22 DIAGNOSIS — I10 Essential (primary) hypertension: Secondary | ICD-10-CM

## 2019-04-22 NOTE — Progress Notes (Signed)
Virtual Visit via Telephone Note  I connected with [name] on 04/22/19 at  2:50 PM EST by telephone and verified that I am speaking with the correct person using two identifiers.   I discussed the limitations, risks, security and privacy concerns of performing an evaluation and management service by telephone and the availability of in person appointments. I also discussed with the patient that there may be a patient responsible charge related to this service. The patient expressed understanding and agreed to proceed.   History of Present Illness: [name] is having a tele visit for bp follow up taking medication taking amlodipine 10mg  and HCTZ 12.5mg  . Problem presented blood pressure monitor broke and she is unable to check her Bp. Denies shortness of breath, headaches, chest pain or lower extremity edema. She voiced only problem she needed a pap this will be scheduled by the end of this visit. Past Medical History:  Diagnosis Date  . Hx of blood clots 1993   previously on Coumadin x 3 months in the past   . Hyperlipidemia   . Hypertension    Current Outpatient Medications on File Prior to Visit  Medication Sig Dispense Refill  . acetaminophen (TYLENOL) 500 MG tablet Take 1,000 mg by mouth daily as needed for mild pain.    . ALOE VERA PO Take by mouth.    Marland Kitchen amLODipine (NORVASC) 10 MG tablet Take 1 tablet (10 mg total) by mouth daily. 30 tablet 3  . Ascorbic Acid (VITAMIN C PO) Take by mouth.    . Cyanocobalamin (VITAMIN B 12 PO) Take by mouth.    . ECHINACEA PO Take by mouth.    . hydrochlorothiazide (HYDRODIURIL) 12.5 MG tablet Take 1 tablet (12.5 mg total) by mouth daily. 30 tablet 3  . pravastatin (PRAVACHOL) 40 MG tablet Take 1 tablet (40 mg total) by mouth daily. 30 tablet 3  . TURMERIC PO Take by mouth.    Marland Kitchen VITAMIN D PO Take by mouth.     No current facility-administered medications on file prior to visit.   Observations/Objective: Review of Systems   Psychiatric/Behavioral:       Under situational stress mother in ICU she is 76  All other systems reviewed and are negative.   Assessment and Plan: Counseled on blood pressure goal of less than 130/80, low-sodium, DASH diet, medication compliance, 150 minutes of moderate intensity exercise per week. Discussed medication compliance, adverse effects. Re-evaluate Bp in person on her follow up visit.  Follow Up Instructions:    I discussed the assessment and treatment plan with the patient. The patient was provided an opportunity to ask questions and all were answered. The patient agreed with the plan and demonstrated an understanding of the instructions.   The patient was advised to call back or seek an in-person evaluation if the symptoms worsen or if the condition fails to improve as anticipated.  I provided 8 minutes of non-face-to-face time during this encounter.   Kerin Perna, NP

## 2019-05-01 ENCOUNTER — Ambulatory Visit (INDEPENDENT_AMBULATORY_CARE_PROVIDER_SITE_OTHER): Payer: Self-pay | Admitting: Primary Care

## 2019-05-08 ENCOUNTER — Ambulatory Visit (INDEPENDENT_AMBULATORY_CARE_PROVIDER_SITE_OTHER): Payer: Self-pay | Admitting: Primary Care

## 2019-05-08 MED FILL — AMLODIPINE BESYLATE 10 MG T: 10 | 30 days supply | Qty: 30 | Fill #3

## 2019-05-29 ENCOUNTER — Encounter (INDEPENDENT_AMBULATORY_CARE_PROVIDER_SITE_OTHER): Payer: Self-pay | Admitting: Primary Care

## 2019-05-29 ENCOUNTER — Other Ambulatory Visit: Payer: Self-pay

## 2019-05-29 ENCOUNTER — Ambulatory Visit (INDEPENDENT_AMBULATORY_CARE_PROVIDER_SITE_OTHER): Payer: Self-pay | Admitting: Primary Care

## 2019-05-29 VITALS — BP 186/114 | HR 77 | Temp 97.2°F | Ht 66.5 in | Wt 205.8 lb

## 2019-05-29 DIAGNOSIS — I10 Essential (primary) hypertension: Secondary | ICD-10-CM

## 2019-05-29 DIAGNOSIS — Z1211 Encounter for screening for malignant neoplasm of colon: Secondary | ICD-10-CM

## 2019-05-29 DIAGNOSIS — R7303 Prediabetes: Secondary | ICD-10-CM

## 2019-05-29 MED ORDER — HYDROCHLOROTHIAZIDE 25 MG PO TABS: 25.0000 mg | ORAL_TABLET | Freq: Every day | ORAL | 1 refills | Status: DC

## 2019-05-29 MED ORDER — PRAVASTATIN SODIUM 40 MG PO TABS: 40.0000 mg | ORAL_TABLET | Freq: Every day | ORAL | 1 refills | Status: DC

## 2019-05-29 MED ORDER — AMLODIPINE BESYLATE 10 MG PO TABS: 10.0000 mg | ORAL_TABLET | Freq: Every day | ORAL | 1 refills | Status: DC

## 2019-05-29 MED ORDER — ALPRAZOLAM 0.5 MG PO TABS: 0.2500 mg | ORAL_TABLET | Freq: Two times a day (BID) | ORAL | 0 refills | Status: DC | PRN

## 2019-05-29 MED ORDER — ALPRAZOLAM 0.5 MG PO TBDP: 0.5000 mg | ORAL_TABLET | Freq: Two times a day (BID) | ORAL | 0 refills | Status: DC | PRN

## 2019-05-29 MED FILL — HYDROCHLOROTHIAZIDE 25 MG T: 25 | 30 days supply | Qty: 30 | Fill #0

## 2019-05-29 MED FILL — PRAVASTATIN NA 40 MG TAB: 40 | 30 days supply | Qty: 30 | Fill #0

## 2019-05-29 NOTE — Progress Notes (Signed)
Established Patient Office Visit  Subjective:  Patient ID: [name], female    DOB: Jul 12, 1955  Age: 64 y.o. MRN: [id number]  CC:  Chief Complaint  Patient presents with  . Hypertension    HPI Yanill Selvaggio presents for originally for a pap but this was reschedule due to loss of her mother. Mother was placed in a nursing home due to memory loss. COVID pandemic cause dqaughter to limit to not seeing her mother. Than when she able to see her mother she noticed she was not being taken care of examples bathing, hair not combed matted up, legs begin to swell and discolored. There is a doctor that made rounds on every Tuesdays. Than no visitors allowed . Patient continued to see mom through the widow. Staff would bring her to the window. Lower dentures were lost and she was unable to chew. Hypertension elevated from discussing the death of her mother.  Past Medical History:  Diagnosis Date  . Hx of blood clots 1993   previously on Coumadin x 3 months in the past   . Hyperlipidemia   . Hypertension     No past surgical history on file.  No family history on file.  Social History   Socioeconomic History  . Marital status: Single    Spouse name: Not on file  . Number of children: Not on file  . Years of education: Not on file  . Highest education level: Not on file  Occupational History  . Not on file  Tobacco Use  . Smoking status: Never Smoker  . Smokeless tobacco: Never Used  Substance and Sexual Activity  . Alcohol use: Yes    Alcohol/week: 2.0 standard drinks    Types: 2 Cans of beer per week  . Drug use: No  . Sexual activity: Not Currently  Other Topics Concern  . Not on file  Social History Narrative  . Not on file   Social Determinants of Health   Financial Resource Strain:   . Difficulty of Paying Living Expenses: Not on file  Food Insecurity:   . Worried About Charity fundraiser in the Last Year: Not on file  . Ran Out of Food in the Last  Year: Not on file  Transportation Needs:   . Lack of Transportation (Medical): Not on file  . Lack of Transportation (Non-Medical): Not on file  Physical Activity:   . Days of Exercise per Week: Not on file  . Minutes of Exercise per Session: Not on file  Stress:   . Feeling of Stress : Not on file  Social Connections:   . Frequency of Communication with Friends and Family: Not on file  . Frequency of Social Gatherings with Friends and Family: Not on file  . Attends Religious Services: Not on file  . Active Member of Clubs or Organizations: Not on file  . Attends Archivist Meetings: Not on file  . Marital Status: Not on file  Intimate Partner Violence:   . Fear of Current or Ex-Partner: Not on file  . Emotionally Abused: Not on file  . Physically Abused: Not on file  . Sexually Abused: Not on file    Outpatient Medications Prior to Visit  Medication Sig Dispense Refill  . acetaminophen (TYLENOL) 500 MG tablet Take 1,000 mg by mouth daily as needed for mild pain.    . ALOE VERA PO Take by mouth.    . Ascorbic Acid (VITAMIN C PO) Take by mouth.    Marland Kitchen  Cyanocobalamin (VITAMIN B 12 PO) Take by mouth.    . ECHINACEA PO Take by mouth.    . TURMERIC PO Take by mouth.    Marland Kitchen VITAMIN D PO Take by mouth.    Marland Kitchen amLODipine (NORVASC) 10 MG tablet Take 1 tablet (10 mg total) by mouth daily. 30 tablet 3  . hydrochlorothiazide (HYDRODIURIL) 12.5 MG tablet Take 1 tablet (12.5 mg total) by mouth daily. 30 tablet 3  . pravastatin (PRAVACHOL) 40 MG tablet Take 1 tablet (40 mg total) by mouth daily. 30 tablet 3   No facility-administered medications prior to visit.    Allergies  Allergen Reactions  . Lisinopril     Suspect ACE inhibitor allergy as patient creatinine nearly doubled after initiation of lisinopril    ROS Review of Systems  Psychiatric/Behavioral:       Depression   All other systems reviewed and are negative.     Objective:    Physical Exam  Constitutional: She  is oriented to person, place, and time. She appears well-developed and well-nourished.  Cardiovascular: Normal rate and regular rhythm.  Abdominal: Bowel sounds are normal.  Musculoskeletal:        General: Normal range of motion.     Cervical back: Neck supple.  Neurological: She is oriented to person, place, and time.  Skin: Skin is warm.  Psychiatric:  Crying,     BP (!) 186/114 (BP Location: Right Arm, Patient Position: Sitting, Cuff Size: Large)   Pulse 77   Temp (!) 97.2 F (36.2 C) (Temporal)   Ht 5' 6.5" (1.689 m)   Wt 205 lb 12.8 oz (93.4 kg)   SpO2 96%   BMI 32.72 kg/m  Wt Readings from Last 3 Encounters:  05/29/19 205 lb 12.8 oz (93.4 kg)  11/07/18 204 lb 3.2 oz (92.6 kg)  10/10/18 203 lb 12.8 oz (92.4 kg)     Health Maintenance Due  Topic Date Due  . PNEUMOCOCCAL POLYSACCHARIDE VACCINE AGE 62-64 HIGH RISK  06/21/1957  . OPHTHALMOLOGY EXAM  06/21/1965  . PAP SMEAR-Modifier  06/21/1976  . MAMMOGRAM  06/21/2005  . HEMOGLOBIN A1C  04/12/2019    There are no preventive care reminders to display for this patient.  Lab Results  Component Value Date   TSH 1.800 08/30/2017   Lab Results  Component Value Date   WBC 4.1 03/06/2018   HGB 13.9 03/06/2018   HCT 43.3 03/06/2018   MCV 92.5 03/06/2018   PLT 211 03/06/2018   Lab Results  Component Value Date   NA 138 03/06/2018   K 3.5 03/06/2018   CO2 26 03/06/2018   GLUCOSE 115 (H) 03/06/2018   BUN 15 03/06/2018   CREATININE 0.94 03/06/2018   BILITOT 0.4 03/06/2018   ALKPHOS 35 (L) 03/06/2018   AST 24 03/06/2018   ALT 20 03/06/2018   PROT 8.0 03/06/2018   ALBUMIN 4.2 03/06/2018   CALCIUM 9.3 03/06/2018   ANIONGAP 12 03/06/2018   Lab Results  Component Value Date   CHOL 260 (H) 08/30/2017   Lab Results  Component Value Date   HDL 55 08/30/2017   Lab Results  Component Value Date   LDLCALC 169 (H) 08/30/2017   Lab Results  Component Value Date   TRIG 180 (H) 08/30/2017   Lab Results   Component Value Date   CHOLHDL 4.7 (H) 08/30/2017   Lab Results  Component Value Date   HGBA1C 5.6 10/10/2018      Assessment & Plan:   Allyn was  seen today for hypertension.  Diagnoses and all orders for this visit:  Special screening for malignant neoplasms, colon Normal colon cancer screening.  CDC .(USPSTF) -     Fecal occult blood, imunochemical([facility]); Future  Prediabetes -     HgB A1c  Essential hypertension Extremely elevated the more she discusse the loss of her mother the more elevated it becomes . She is aware of Bp goal 130/80 -     pravastatin (PRAVACHOL) 40 MG tablet; Take 1 tablet (40 mg total) by mouth daily. -     amLODipine (NORVASC) 10 MG tablet; Take 1 tablet (10 mg total) by mouth daily. -     hydrochlorothiazide (HYDRODIURIL) 25 MG tablet; Take 1 tablet (25 mg total) by mouth daily.  Other orders -     Discontinue: ALPRAZolam (XANAX) 0.5 MG tablet; Take 0.5 tablets (0.25 mg total) by mouth 2 (two) times daily as needed for anxiety. -     ALPRAZolam (NIRAVAM) 0.5 MG dissolvable tablet; Take 1 tablet (0.5 mg total) by mouth 2 (two) times daily as needed for anxiety.    Meds ordered this encounter  Medications  . DISCONTD: ALPRAZolam (XANAX) 0.5 MG tablet    Sig: Take 0.5 tablets (0.25 mg total) by mouth 2 (two) times daily as needed for anxiety.    Dispense:  30 tablet    Refill:  0  . pravastatin (PRAVACHOL) 40 MG tablet    Sig: Take 1 tablet (40 mg total) by mouth daily.    Dispense:  90 tablet    Refill:  1  . amLODipine (NORVASC) 10 MG tablet    Sig: Take 1 tablet (10 mg total) by mouth daily.    Dispense:  90 tablet    Refill:  1  . hydrochlorothiazide (HYDRODIURIL) 25 MG tablet    Sig: Take 1 tablet (25 mg total) by mouth daily.    Dispense:  90 tablet    Refill:  1  . ALPRAZolam (NIRAVAM) 0.5 MG dissolvable tablet    Sig: Take 1 tablet (0.5 mg total) by mouth 2 (two) times daily as needed for anxiety.    Dispense:  30 tablet     Refill:  0    Follow-up: No follow-ups on file.  Reschedule pap  Kerin Perna, NP

## 2019-06-05 ENCOUNTER — Other Ambulatory Visit (INDEPENDENT_AMBULATORY_CARE_PROVIDER_SITE_OTHER): Payer: Self-pay | Admitting: Primary Care

## 2019-06-05 ENCOUNTER — Telehealth (INDEPENDENT_AMBULATORY_CARE_PROVIDER_SITE_OTHER): Payer: Self-pay

## 2019-06-05 MED ORDER — ALPRAZOLAM 0.25 MG PO TABS: 0.2500 mg | ORAL_TABLET | Freq: Two times a day (BID) | ORAL | 0 refills | Status: DC | PRN

## 2019-06-05 NOTE — Telephone Encounter (Signed)
Sent to PCP ?

## 2019-06-05 NOTE — Telephone Encounter (Signed)
Patient called stating that she went to [facility] to have her medication filled for ALPRAZolam (NIRAVAM) 0.5 MG dissolvable tablet  And was advice that it would be $66. Patient does not have the money to pay out of pocket. Patient was advice by the pharmacist to reach out to her PCP to send in a Rx for the regular tablet that is not dissolvable so that it can be affordable.  Patient uses [facility]   Please advice [phone]

## 2019-06-18 ENCOUNTER — Other Ambulatory Visit: Payer: Self-pay

## 2019-06-18 ENCOUNTER — Ambulatory Visit: Payer: Self-pay

## 2019-06-18 ENCOUNTER — Ambulatory Visit: Payer: Self-pay | Attending: Primary Care

## 2019-07-15 MED FILL — AMLODIPINE BESYLATE 10 MG T: 10 | 30 days supply | Qty: 30 | Fill #0

## 2019-07-24 ENCOUNTER — Ambulatory Visit (INDEPENDENT_AMBULATORY_CARE_PROVIDER_SITE_OTHER): Payer: Self-pay | Admitting: Primary Care

## 2019-07-28 ENCOUNTER — Telehealth (INDEPENDENT_AMBULATORY_CARE_PROVIDER_SITE_OTHER): Payer: Self-pay | Admitting: Primary Care

## 2019-07-28 ENCOUNTER — Encounter (INDEPENDENT_AMBULATORY_CARE_PROVIDER_SITE_OTHER): Payer: Self-pay | Admitting: Primary Care

## 2019-07-28 ENCOUNTER — Other Ambulatory Visit: Payer: Self-pay

## 2019-07-28 DIAGNOSIS — Z0130 Encounter for examination of blood pressure without abnormal findings: Secondary | ICD-10-CM

## 2019-07-28 NOTE — Progress Notes (Signed)
Bp this am 125/81 HR 79 Does check daily  4/22- 149/96 4/21- 130/92  Pt was checking Bp in the morning but not taking medication until night time Amlodipine makes her feel sleeping if she takes during the day

## 2019-07-28 NOTE — Progress Notes (Signed)
Virtual Visit via Telephone Note  I connected with [name] on 07/28/19 at  3:50 PM EDT by telephone and verified that I am speaking with the correct person using two identifiers.   I discussed the limitations, risks, security and privacy concerns of performing an evaluation and management service by telephone and the availability of in person appointments. I also discussed with the patient that there may be a patient responsible charge related to this service. The patient expressed understanding and agreed to proceed.   History of Present Illness: [name] is having a tele visit for blood pressure follow up. Bp this am 0000000 HR 79 Systolic 0000000 and diastolic [id number] Pt was checking Bp in the morning but not taking medication until night time. She feels Amlodipine 10mg  daily makes her feel sleepy. Wakes up every 2 hours to urinate she takes HCTZ 25mg  at bedtime, Advise to take HCTZ in Am to reduce urination during the night. Past Medical History:  Diagnosis Date  . Hx of blood clots 1993   previously on Coumadin x 3 months in the past   . Hyperlipidemia   . Hypertension    Current Outpatient Medications on File Prior to Visit  Medication Sig Dispense Refill  . acetaminophen (TYLENOL) 500 MG tablet Take 1,000 mg by mouth daily as needed for mild pain.    . ALOE VERA PO Take by mouth.    . ALPRAZolam (XANAX) 0.25 MG tablet Take 1 tablet (0.25 mg total) by mouth 2 (two) times daily as needed for anxiety. 30 tablet 0  . amLODipine (NORVASC) 10 MG tablet Take 1 tablet (10 mg total) by mouth daily. 90 tablet 1  . Ascorbic Acid (VITAMIN C PO) Take by mouth.    . Cyanocobalamin (VITAMIN B 12 PO) Take by mouth.    . ECHINACEA PO Take by mouth.    . hydrochlorothiazide (HYDRODIURIL) 25 MG tablet Take 1 tablet (25 mg total) by mouth daily. 90 tablet 1  . pravastatin (PRAVACHOL) 40 MG tablet Take 1 tablet (40 mg total) by mouth daily. 90 tablet 1  . TURMERIC PO Take by mouth.     Marland Kitchen VITAMIN D PO Take by mouth.     No current facility-administered medications on file prior to visit.    Observations/Objective: Review of Systems  Genitourinary: Positive for frequency and urgency.  All other systems reviewed and are negative.   Assessment and Plan: Maronda was seen today for hypertension.  Diagnoses and all orders for this visit:  Blood pressure check  We have discussed target BP range and blood pressure goal 130/80.  I have advised patient to check BP regularly and to call us back or report to clinic if the numbers are consistently higher than 140/90. We discussed the importance of compliance with medical therapy and DASH diet recommended, consequences of uncontrolled hypertension discussed.  - continue current BP medications amlodipine 10mg  at bedtime and and HCTZ 25 mg in the morning.     Follow Up Instructions:    I discussed the assessment and treatment plan with the patient. The patient was provided an opportunity to ask questions and all were answered. The patient agreed with the plan and demonstrated an understanding of the instructions.   The patient was advised to call back or seek an in-person evaluation if the symptoms worsen or if the condition fails to improve as anticipated.  I provided  8 minutes of non-face-to-face time during this encounter.   Kerin Perna, NP

## 2019-08-12 ENCOUNTER — Telehealth: Payer: Self-pay | Admitting: Primary Care

## 2019-08-12 NOTE — Telephone Encounter (Signed)
Received VM requesting a call back and an update on her financial assistance application. Called pt back and LVM stating she was approved for [employer] 100% financial assistance from 06/18/2019-12/19/2019. Will mail out a copy of her approval letter today.

## 2019-08-19 MED FILL — HYDROCHLOROTHIAZIDE 12.5 MG: 12.5 | 30 days supply | Qty: 30 | Fill #3

## 2019-08-19 MED FILL — PRAVASTATIN NA 40 MG TAB: 40 | 30 days supply | Qty: 30 | Fill #3

## 2019-09-03 MED FILL — ?AMLODIPINE BESYL 10MG TABL: 10 | 30 days supply | Qty: 30 | Fill #1

## 2019-09-10 ENCOUNTER — Ambulatory Visit (INDEPENDENT_AMBULATORY_CARE_PROVIDER_SITE_OTHER): Payer: Self-pay | Admitting: Primary Care

## 2019-09-24 ENCOUNTER — Ambulatory Visit (INDEPENDENT_AMBULATORY_CARE_PROVIDER_SITE_OTHER): Payer: Self-pay | Admitting: Primary Care

## 2019-09-24 ENCOUNTER — Other Ambulatory Visit: Payer: Self-pay

## 2019-09-24 ENCOUNTER — Other Ambulatory Visit (INDEPENDENT_AMBULATORY_CARE_PROVIDER_SITE_OTHER): Payer: Self-pay | Admitting: Primary Care

## 2019-09-24 ENCOUNTER — Encounter (INDEPENDENT_AMBULATORY_CARE_PROVIDER_SITE_OTHER): Payer: Self-pay | Admitting: Primary Care

## 2019-09-24 VITALS — BP 137/89 | HR 73 | Temp 98.2°F | Ht 66.5 in | Wt 202.8 lb

## 2019-09-24 DIAGNOSIS — K047 Periapical abscess without sinus: Secondary | ICD-10-CM

## 2019-09-24 DIAGNOSIS — I10 Essential (primary) hypertension: Secondary | ICD-10-CM

## 2019-09-24 DIAGNOSIS — K029 Dental caries, unspecified: Secondary | ICD-10-CM

## 2019-09-24 DIAGNOSIS — R7303 Prediabetes: Secondary | ICD-10-CM

## 2019-09-24 DIAGNOSIS — H539 Unspecified visual disturbance: Secondary | ICD-10-CM

## 2019-09-24 DIAGNOSIS — E782 Mixed hyperlipidemia: Secondary | ICD-10-CM

## 2019-09-24 DIAGNOSIS — Z760 Encounter for issue of repeat prescription: Secondary | ICD-10-CM

## 2019-09-24 DIAGNOSIS — M549 Dorsalgia, unspecified: Secondary | ICD-10-CM

## 2019-09-24 DIAGNOSIS — R5383 Other fatigue: Secondary | ICD-10-CM

## 2019-09-24 MED ORDER — IBUPROFEN 800 MG PO TABS: 800.0000 mg | ORAL_TABLET | Freq: Three times a day (TID) | ORAL | 1 refills | Status: DC | PRN

## 2019-09-24 MED ORDER — HYDROCHLOROTHIAZIDE 25 MG PO TABS: 25.0000 mg | ORAL_TABLET | Freq: Every day | ORAL | 1 refills | Status: DC

## 2019-09-24 MED ORDER — AMLODIPINE BESYLATE 10 MG PO TABS: 10.0000 mg | ORAL_TABLET | Freq: Every day | ORAL | 1 refills | Status: DC

## 2019-09-24 MED ORDER — PRAVASTATIN SODIUM 40 MG PO TABS: 40.0000 mg | ORAL_TABLET | Freq: Every day | ORAL | 1 refills | Status: DC

## 2019-09-24 MED ORDER — ASPIRIN EC 81 MG PO TBEC: 81.0000 mg | DELAYED_RELEASE_TABLET | Freq: Every day | ORAL | 11 refills | Status: DC

## 2019-09-24 NOTE — Progress Notes (Signed)
Acute Office Visit  Subjective:    Patient ID: [name], female    DOB: 07/10/1955, 64 y.o.   MRN: [id number]  Chief Complaint  Patient presents with  . Hypertension  . Fatigue  . Referral    dental     HPI   Past Medical History:  Diagnosis Date  . Hx of blood clots 1993   previously on Coumadin x 3 months in the past   . Hyperlipidemia   . Hypertension     No past surgical history on file.  No family history on file.  Social History   Socioeconomic History  . Marital status: Single    Spouse name: Not on file  . Number of children: Not on file  . Years of education: Not on file  . Highest education level: Not on file  Occupational History  . Not on file  Tobacco Use  . Smoking status: Never Smoker  . Smokeless tobacco: Never Used  Vaping Use  . Vaping Use: Never used  Substance and Sexual Activity  . Alcohol use: Yes    Alcohol/week: 2.0 standard drinks    Types: 2 Cans of beer per week  . Drug use: No  . Sexual activity: Not Currently  Other Topics Concern  . Not on file  Social History Narrative  . Not on file   Social Determinants of Health   Financial Resource Strain:   . Difficulty of Paying Living Expenses:   Food Insecurity:   . Worried About Charity fundraiser in the Last Year:   . Arboriculturist in the Last Year:   Transportation Needs:   . Film/video editor (Medical):   Marland Kitchen Lack of Transportation (Non-Medical):   Physical Activity:   . Days of Exercise per Week:   . Minutes of Exercise per Session:   Stress:   . Feeling of Stress :   Social Connections:   . Frequency of Communication with Friends and Family:   . Frequency of Social Gatherings with Friends and Family:   . Attends Religious Services:   . Active Member of Clubs or Organizations:   . Attends Archivist Meetings:   Marland Kitchen Marital Status:   Intimate Partner Violence:   . Fear of Current or Ex-Partner:   . Emotionally Abused:   Marland Kitchen Physically  Abused:   . Sexually Abused:     Outpatient Medications Prior to Visit  Medication Sig Dispense Refill  . acetaminophen (TYLENOL) 500 MG tablet Take 1,000 mg by mouth daily as needed for mild pain.    . ALOE VERA PO Take by mouth.    . Ascorbic Acid (VITAMIN C PO) Take by mouth.    . Cyanocobalamin (VITAMIN B 12 PO) Take by mouth.    . ECHINACEA PO Take by mouth.    . TURMERIC PO Take by mouth.    Marland Kitchen VITAMIN D PO Take by mouth.    Marland Kitchen amLODipine (NORVASC) 10 MG tablet Take 1 tablet (10 mg total) by mouth daily. 90 tablet 1  . hydrochlorothiazide (HYDRODIURIL) 25 MG tablet Take 1 tablet (25 mg total) by mouth daily. 90 tablet 1  . pravastatin (PRAVACHOL) 40 MG tablet Take 1 tablet (40 mg total) by mouth daily. 90 tablet 1  . ALPRAZolam (XANAX) 0.25 MG tablet Take 1 tablet (0.25 mg total) by mouth 2 (two) times daily as needed for anxiety. 30 tablet 0   No facility-administered medications prior to visit.  Allergies  Allergen Reactions  . Lisinopril     Suspect ACE inhibitor allergy as patient creatinine nearly doubled after initiation of lisinopril    Review of Systems  Constitutional: Positive for fatigue.  HENT:       Tooth upper right chipped sensitive to temperature change  Eyes: Positive for visual disturbance.  All other systems reviewed and are negative.      Objective:    Physical Exam  BP 137/89 (BP Location: Left Arm, Patient Position: Sitting, Cuff Size: Normal)   Pulse 73   Temp 98.2 F (36.8 C) (Oral)   Ht 5' 6.5" (1.689 m)   Wt 202 lb 12.8 oz (92 kg)   SpO2 93%   BMI 32.24 kg/m  Wt Readings from Last 3 Encounters:  09/24/19 202 lb 12.8 oz (92 kg)  05/29/19 205 lb 12.8 oz (93.4 kg)  11/07/18 204 lb 3.2 oz (92.6 kg)    Health Maintenance Due  Topic Date Due  . OPHTHALMOLOGY EXAM  Never done  . COVID-19 Vaccine (1) Never done  . PAP SMEAR-Modifier  Never done  . MAMMOGRAM  Never done  . HEMOGLOBIN A1C  04/12/2019  . URINE MICROALBUMIN  10/10/2019     There are no preventive care reminders to display for this patient.   Lab Results  Component Value Date   TSH 1.800 08/30/2017   Lab Results  Component Value Date   WBC 4.1 03/06/2018   HGB 13.9 03/06/2018   HCT 43.3 03/06/2018   MCV 92.5 03/06/2018   PLT 211 03/06/2018   Lab Results  Component Value Date   NA 138 03/06/2018   K 3.5 03/06/2018   CO2 26 03/06/2018   GLUCOSE 115 (H) 03/06/2018   BUN 15 03/06/2018   CREATININE 0.94 03/06/2018   BILITOT 0.4 03/06/2018   ALKPHOS 35 (L) 03/06/2018   AST 24 03/06/2018   ALT 20 03/06/2018   PROT 8.0 03/06/2018   ALBUMIN 4.2 03/06/2018   CALCIUM 9.3 03/06/2018   ANIONGAP 12 03/06/2018   Lab Results  Component Value Date   CHOL 260 (H) 08/30/2017   Lab Results  Component Value Date   HDL 55 08/30/2017   Lab Results  Component Value Date   LDLCALC 169 (H) 08/30/2017   Lab Results  Component Value Date   TRIG 180 (H) 08/30/2017   Lab Results  Component Value Date   CHOLHDL 4.7 (H) 08/30/2017   Lab Results  Component Value Date   HGBA1C 5.6 10/10/2018       Assessment & Plan:  [name] was seen today for hypertension, fatigue and referral.  Diagnoses and all orders for this visit:  Essential hypertension/ Medication refill Counseled on blood pressure goal of less than 130/80, low-sodium, DASH diet, medication compliance, 150 minutes of moderate intensity exercise per week. Discussed medication compliance, adverse effects. -     amLODipine (NORVASC) 10 MG tablet; Take 1 tablet (10 mg total) by mouth daily. -     hydrochlorothiazide (HYDRODIURIL) 25 MG tablet; Take 1 tablet (25 mg total) by mouth daily. -     pravastatin (PRAVACHOL) 40 MG tablet; Take 1 tablet (40 mg total) by mouth daily. -     aspirin EC 81 MG tablet; Take 1 tablet (81 mg total) by mouth daily. Swallow whole. 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    Meds ordered this encounter  Medications  . amLODipine (NORVASC) 10 MG tablet    Sig: Take 1 tablet (10 mg total) by mouth daily.    Dispense:  90 tablet    Refill:  1  . hydrochlorothiazide (HYDRODIURIL) 25 MG tablet    Sig: Take 1 tablet (25 mg total) by mouth daily.    Dispense:  90 tablet    Refill:  1  . pravastatin (PRAVACHOL) 40 MG tablet    Sig: Take 1 tablet (40 mg total) by mouth daily.    Dispense:  90 tablet    Refill:  1  . aspirin EC 81 MG tablet    Sig: Take 1 tablet (81 mg total) by mouth daily. Swallow whole.    Dispense:  30 tablet    Refill:  11     Kerin Perna, NP

## 2019-09-24 NOTE — Patient Instructions (Addendum)
Low-Sodium Eating Plan Sodium, which is an element that makes up salt, helps you maintain a healthy balance of fluids in your body. Too much sodium can increase your blood pressure and cause fluid and waste to be held in your body. Your health care provider or dietitian may recommend following this plan if you have high blood pressure (hypertension), kidney disease, liver disease, or heart failure. Eating less sodium can help lower your blood pressure, reduce swelling, and protect your heart, liver, and kidneys. What are tips for following this plan? General guidelines  Most people on this plan should limit their sodium intake to 1,500-2,000 mg (milligrams) of sodium each day. Reading food labels   The Nutrition Facts label lists the amount of sodium in one serving of the food. If you eat more than one serving, you must multiply the listed amount of sodium by the number of servings.  Choose foods with less than 140 mg of sodium per serving.  Avoid foods with 300 mg of sodium or more per serving. Shopping  Look for lower-sodium products, often labeled as "low-sodium" or "no salt added."  Always check the sodium content even if foods are labeled as "unsalted" or "no salt added".  Buy fresh foods. ? Avoid canned foods and premade or frozen meals. ? Avoid canned, cured, or processed meats  Buy breads that have less than 80 mg of sodium per slice. Cooking  Eat more home-cooked food and less restaurant, buffet, and fast food.  Avoid adding salt when cooking. Use salt-free seasonings or herbs instead of table salt or sea salt. Check with your health care provider or pharmacist before using salt substitutes.  Cook with plant-based oils, such as canola, sunflower, or olive oil. Meal planning  When eating at a restaurant, ask that your food be prepared with less salt or no salt, if possible.  Avoid foods that contain MSG (monosodium glutamate). MSG is sometimes added to [place] food,  bouillon, and some canned foods. What foods are recommended? The items listed may not be a complete list. Talk with your dietitian about what dietary choices are best for you. Grains Low-sodium cereals, including oats, puffed wheat and rice, and shredded wheat. Low-sodium crackers. Unsalted rice. Unsalted pasta. Low-sodium bread. Whole-grain breads and whole-grain pasta. Vegetables Fresh or frozen vegetables. "No salt added" canned vegetables. "No salt added" tomato sauce and paste. Low-sodium or reduced-sodium tomato and vegetable juice. Fruits Fresh, frozen, or canned fruit. Fruit juice. Meats and other protein foods Fresh or frozen (no salt added) meat, poultry, seafood, and fish. Low-sodium canned tuna and salmon. Unsalted nuts. Dried peas, beans, and lentils without added salt. Unsalted canned beans. Eggs. Unsalted nut butters. Dairy Milk. Soy milk. Cheese that is naturally low in sodium, such as ricotta cheese, fresh mozzarella, or Swiss cheese Low-sodium or reduced-sodium cheese. Cream cheese. Yogurt. Fats and oils Unsalted butter. Unsalted margarine with no trans fat. Vegetable oils such as canola or olive oils. Seasonings and other foods Fresh and dried herbs and spices. Salt-free seasonings. Low-sodium mustard and ketchup. Sodium-free salad dressing. Sodium-free light mayonnaise. Fresh or refrigerated horseradish. Lemon juice. Vinegar. Homemade, reduced-sodium, or low-sodium soups. Unsalted popcorn and pretzels. Low-salt or salt-free chips. What foods are not recommended? The items listed may not be a complete list. Talk with your dietitian about what dietary choices are best for you. Grains Instant hot cereals. Bread stuffing, pancake, and biscuit mixes. Croutons. Seasoned rice or pasta mixes. Noodle soup cups. Boxed or frozen macaroni and cheese. Regular  salted crackers. Self-rising flour. Vegetables Sauerkraut, pickled vegetables, and relishes. Olives. [place] fries. Onion rings.  Regular canned vegetables (not low-sodium or reduced-sodium). Regular canned tomato sauce and paste (not low-sodium or reduced-sodium). Regular tomato and vegetable juice (not low-sodium or reduced-sodium). Frozen vegetables in sauces. Meats and other protein foods Meat or fish that is salted, canned, smoked, spiced, or pickled. Bacon, ham, sausage, hotdogs, corned beef, chipped beef, packaged lunch meats, salt pork, jerky, pickled herring, anchovies, regular canned tuna, sardines, salted nuts. Dairy Processed cheese and cheese spreads. Cheese curds. Blue cheese. Feta cheese. String cheese. Regular cottage cheese. Buttermilk. Canned milk. Fats and oils Salted butter. Regular margarine. Ghee. Bacon fat. Seasonings and other foods Onion salt, garlic salt, seasoned salt, table salt, and sea salt. Canned and packaged gravies. Worcestershire sauce. Tartar sauce. Barbecue sauce. Teriyaki sauce. Soy sauce, including reduced-sodium. Steak sauce. Fish sauce. Oyster sauce. Cocktail sauce. Horseradish that you find on the shelf. Regular ketchup and mustard. Meat flavorings and tenderizers. Bouillon cubes. Hot sauce and Tabasco sauce. Premade or packaged marinades. Premade or packaged taco seasonings. Relishes. Regular salad dressings. Salsa. Potato and tortilla chips. Corn chips and puffs. Salted popcorn and pretzels. Canned or dried soups. Pizza. Frozen entrees and pot pies. Summary  Eating less sodium can help lower your blood pressure, reduce swelling, and protect your heart, liver, and kidneys.  Most people on this plan should limit their sodium intake to 1,500-2,000 mg (milligrams) of sodium each day.  Canned, boxed, and frozen foods are high in sodium. Restaurant foods, fast foods, and pizza are also very high in sodium. You also get sodium by adding salt to food.  Try to cook at home, eat more fresh fruits and vegetables, and eat less fast food, canned, processed, or prepared foods. This information is  not intended to replace advice given to you by your health care provider. Make sure you discuss any questions you have with your health care provider. Document Revised: 03/02/2017 Document Reviewed: 03/13/2016 Elsevier Patient Education  [place].   Hypertension, Adult High blood pressure (hypertension) is when the force of blood pumping through the arteries is too strong. The arteries are the blood vessels that carry blood from the heart throughout the body. Hypertension forces the heart to work harder to pump blood and may cause arteries to become narrow or stiff. Untreated or uncontrolled hypertension can cause a heart attack, heart failure, a stroke, kidney disease, and other problems. A blood pressure reading consists of a higher number over a lower number. Ideally, your blood pressure should be below 120/80. The first ("top") number is called the systolic pressure. It is a measure of the pressure in your arteries as your heart beats. The second ("bottom") number is called the diastolic pressure. It is a measure of the pressure in your arteries as the heart relaxes. What are the causes? The exact cause of this condition is not known. There are some conditions that result in or are related to high blood pressure. What increases the risk? Some risk factors for high blood pressure are under your control. The following factors may make you more likely to develop this condition:  Smoking.  Having type 2 diabetes mellitus, high cholesterol, or both.  Not getting enough exercise or physical activity.  Being overweight.  Having too much fat, sugar, calories, or salt (sodium) in your diet.  Drinking too much alcohol. Some risk factors for high blood pressure may be difficult or impossible to change. Some of these factors  include:  Having chronic kidney disease.  Having a family history of high blood pressure.  Age. Risk increases with age.  Race. You may be at higher risk if you  are African American.  Gender. Men are at higher risk than women before age 28. After age 20, women are at higher risk than men.  Having obstructive sleep apnea.  Stress. What are the signs or symptoms? High blood pressure may not cause symptoms. Very high blood pressure (hypertensive crisis) may cause:  Headache.  Anxiety.  Shortness of breath.  Nosebleed.  Nausea and vomiting.  Vision changes.  Severe chest pain.  Seizures. How is this diagnosed? This condition is diagnosed by measuring your blood pressure while you are seated, with your arm resting on a flat surface, your legs uncrossed, and your feet flat on the floor. The cuff of the blood pressure monitor will be placed directly against the skin of your upper arm at the level of your heart. It should be measured at least twice using the same arm. Certain conditions can cause a difference in blood pressure between your right and left arms. Certain factors can cause blood pressure readings to be lower or higher than normal for a short period of time:  When your blood pressure is higher when you are in a health care provider's office than when you are at home, this is called white coat hypertension. Most people with this condition do not need medicines.  When your blood pressure is higher at home than when you are in a health care provider's office, this is called masked hypertension. Most people with this condition may need medicines to control blood pressure. If you have a high blood pressure reading during one visit or you have normal blood pressure with other risk factors, you may be asked to:  Return on a different day to have your blood pressure checked again.  Monitor your blood pressure at home for 1 week or longer. If you are diagnosed with hypertension, you may have other blood or imaging tests to help your health care provider understand your overall risk for other conditions. How is this treated? This condition  is treated by making healthy lifestyle changes, such as eating healthy foods, exercising more, and reducing your alcohol intake. Your health care provider may prescribe medicine if lifestyle changes are not enough to get your blood pressure under control, and if:  Your systolic blood pressure is above 130.  Your diastolic blood pressure is above 80. Your personal target blood pressure may vary depending on your medical conditions, your age, and other factors. Follow these instructions at home: Eating and drinking   Eat a diet that is high in fiber and potassium, and low in sodium, added sugar, and fat. An example eating plan is called the DASH (Dietary Approaches to Stop Hypertension) diet. To eat this way: ? Eat plenty of fresh fruits and vegetables. Try to fill one half of your plate at each meal with fruits and vegetables. ? Eat whole grains, such as whole-wheat pasta, brown rice, or whole-grain bread. Fill about one fourth of your plate with whole grains. ? Eat or drink low-fat dairy products, such as skim milk or low-fat yogurt. ? Avoid fatty cuts of meat, processed or cured meats, and poultry with skin. Fill about one fourth of your plate with lean proteins, such as fish, chicken without skin, beans, eggs, or tofu. ? Avoid pre-made and processed foods. These tend to be higher in sodium, added sugar,  and fat.  Reduce your daily sodium intake. Most people with hypertension should eat less than 1,500 mg of sodium a day.  Do not drink alcohol if: ? Your health care provider tells you not to drink. ? You are pregnant, may be pregnant, or are planning to become pregnant.  If you drink alcohol: ? Limit how much you use to:  0-1 drink a day for women.  0-2 drinks a day for men. ? Be aware of how much alcohol is in your drink. In the [place] one drink equals one 12 oz bottle of beer (355 mL), one 5 oz glass of wine (148 mL), or one 1 oz glass of hard liquor (44 mL). Lifestyle   Work with  your health care provider to maintain a healthy body weight or to lose weight. Ask what an ideal weight is for you.  Get at least 30 minutes of exercise most days of the week. Activities may include walking, swimming, or biking.  Include exercise to strengthen your muscles (resistance exercise), such as Pilates or lifting weights, as part of your weekly exercise routine. Try to do these types of exercises for 30 minutes at least 3 days a week.  Do not use any products that contain nicotine or tobacco, such as cigarettes, e-cigarettes, and chewing tobacco. If you need help quitting, ask your health care provider.  Monitor your blood pressure at home as told by your health care provider.  Keep all follow-up visits as told by your health care provider. This is important. Medicines  Take over-the-counter and prescription medicines only as told by your health care provider. Follow directions carefully. Blood pressure medicines must be taken as prescribed.  Do not skip doses of blood pressure medicine. Doing this puts you at risk for problems and can make the medicine less effective.  Ask your health care provider about side effects or reactions to medicines that you should watch for. Contact a health care provider if you:  Think you are having a reaction to a medicine you are taking.  Have headaches that keep coming back (recurring).  Feel dizzy.  Have swelling in your ankles.  Have trouble with your vision. Get help right away if you:  Develop a severe headache or confusion.  Have unusual weakness or numbness.  Feel faint.  Have severe pain in your chest or abdomen.  Vomit repeatedly.  Have trouble breathing. Summary  Hypertension is when the force of blood pumping through your arteries is too strong. If this condition is not controlled, it may put you at risk for serious complications.  Your personal target blood pressure may vary depending on your medical conditions, your  age, and other factors. For most people, a normal blood pressure is less than 120/80.  Hypertension is treated with lifestyle changes, medicines, or a combination of both. Lifestyle changes include losing weight, eating a healthy, low-sodium diet, exercising more, and limiting alcohol. This information is not intended to replace advice given to you by your health care provider. Make sure you discuss any questions you have with your health care provider. Document Revised: 11/28/2017 Document Reviewed: 11/28/2017 Elsevier Patient Education  2020 [employer].

## 2019-09-25 MED FILL — ?IBUPROFEN 800MG TABLETS: 800 | 30 days supply | Qty: 90 | Fill #0

## 2019-09-25 MED FILL — ?PRAVASTATIN NA 40MG TABL: 40 | 30 days supply | Qty: 30 | Fill #0

## 2019-09-25 MED FILL — HYDROCHLOROTHIAZIDE 25 MG T: 25 | 30 days supply | Qty: 30 | Fill #0

## 2019-10-07 ENCOUNTER — Other Ambulatory Visit (INDEPENDENT_AMBULATORY_CARE_PROVIDER_SITE_OTHER): Payer: Self-pay

## 2019-10-29 MED FILL — ?AMLODIPINE BESYL 10MG TABL: 10 | 30 days supply | Qty: 30 | Fill #2

## 2019-11-21 MED FILL — PRAVASTATIN NA 40 MG TAB: 40 | 30 days supply | Qty: 30 | Fill #1

## 2019-11-26 ENCOUNTER — Other Ambulatory Visit (INDEPENDENT_AMBULATORY_CARE_PROVIDER_SITE_OTHER): Payer: Self-pay

## 2019-12-01 MED FILL — IBUPROFEN 800 MG TABLET: 800 | 30 days supply | Qty: 90 | Fill #1

## 2019-12-03 ENCOUNTER — Other Ambulatory Visit (INDEPENDENT_AMBULATORY_CARE_PROVIDER_SITE_OTHER): Payer: Self-pay

## 2019-12-10 MED FILL — IBUPROFEN 800 MG TABLET: 800 | 30 days supply | Qty: 90 | Fill #1

## 2019-12-15 MED FILL — HYDROCHLOROTHIAZIDE 25 MG T: 25 | 30 days supply | Qty: 30 | Fill #1

## 2019-12-15 MED FILL — AMLODIPINE BESYLATE 10 MG T: 10 | 30 days supply | Qty: 30 | Fill #3

## 2019-12-15 MED FILL — PRAVASTATIN NA 40 MG TAB: 40 | 30 days supply | Qty: 30 | Fill #2

## 2019-12-18 ENCOUNTER — Ambulatory Visit (INDEPENDENT_AMBULATORY_CARE_PROVIDER_SITE_OTHER): Payer: Self-pay | Admitting: Primary Care

## 2019-12-22 ENCOUNTER — Ambulatory Visit: Payer: Self-pay

## 2019-12-22 ENCOUNTER — Telehealth: Payer: Self-pay | Admitting: Primary Care

## 2019-12-22 NOTE — Telephone Encounter (Signed)
Copied from [place] [id number]. Topic: Quick Communication - Appointment Cancellation >> Dec 22, 2019  3:08 PM Lennox Solders wrote: Patient called to cancel appointment scheduled for today Patient HAS [id number]. Pt is calling to rsc her appt with carlos  Route to department's PEC pool.

## 2019-12-23 NOTE — Telephone Encounter (Signed)
Pt already has a financial appt °

## 2019-12-25 ENCOUNTER — Ambulatory Visit (INDEPENDENT_AMBULATORY_CARE_PROVIDER_SITE_OTHER): Payer: Self-pay | Admitting: Primary Care

## 2019-12-29 ENCOUNTER — Other Ambulatory Visit: Payer: Self-pay

## 2019-12-29 ENCOUNTER — Ambulatory Visit (INDEPENDENT_AMBULATORY_CARE_PROVIDER_SITE_OTHER): Payer: Self-pay | Admitting: Primary Care

## 2019-12-29 DIAGNOSIS — Z20822 Contact with and (suspected) exposure to covid-19: Secondary | ICD-10-CM

## 2019-12-30 LAB — SARS-COV-2, NAA 2 DAY TAT

## 2019-12-30 LAB — NOVEL CORONAVIRUS, NAA: SARS-CoV-2, NAA: NOT DETECTED

## 2019-12-31 ENCOUNTER — Other Ambulatory Visit: Payer: Self-pay

## 2019-12-31 ENCOUNTER — Ambulatory Visit: Payer: Self-pay | Attending: Primary Care

## 2020-01-06 ENCOUNTER — Telehealth: Payer: Self-pay | Admitting: *Deleted

## 2020-01-06 NOTE — Telephone Encounter (Signed)
Patient called to get COVID results. Made her aware they were negative.

## 2020-01-15 ENCOUNTER — Telehealth: Payer: Self-pay | Admitting: Primary Care

## 2020-01-15 NOTE — Telephone Encounter (Signed)
Copied from [place] [id number]. Topic: Appointment Scheduling - Scheduling Inquiry for Clinic >> Jan 15, 2020  9:50 AM Greggory Keen D wrote: Reason for CRM: Pt has an in office appt on the 25th of this month at 3:00.  She wants to make this appt a virtual appt but she is saying she gets off at 3:00 and wants to know if the appt can be around 3:30.  CB#  [phone]

## 2020-01-15 NOTE — Telephone Encounter (Signed)
Pt appt has been changed to virtual ° °

## 2020-01-20 ENCOUNTER — Telehealth: Payer: Self-pay | Admitting: Primary Care

## 2020-01-20 NOTE — Telephone Encounter (Signed)
Pt was sent a letter from financial dept. Inform them, that the application they submitted was incomplete, since they were missing some documentation at the time of the appointment, Pt need to reschedule and resubmit all new papers and application for [facility] and [facility], P.S. old documents has been sent back by mail to the Pt and Pt. need to make a new appt. 

## 2020-01-22 MED FILL — AMLODIPINE BESYLATE 10 MG T: 10 | 30 days supply | Qty: 30 | Fill #4

## 2020-01-26 ENCOUNTER — Telehealth (INDEPENDENT_AMBULATORY_CARE_PROVIDER_SITE_OTHER): Payer: Self-pay | Admitting: Primary Care

## 2020-02-04 MED FILL — ?PRAVASTATIN NA 40MG TAB: 40 | 30 days supply | Qty: 30 | Fill #3

## 2020-02-05 ENCOUNTER — Other Ambulatory Visit: Payer: Self-pay

## 2020-02-05 ENCOUNTER — Ambulatory Visit: Payer: Self-pay

## 2020-02-13 ENCOUNTER — Ambulatory Visit: Payer: Self-pay

## 2020-02-16 ENCOUNTER — Telehealth: Payer: Self-pay | Admitting: Primary Care

## 2020-02-16 NOTE — Telephone Encounter (Signed)
Copied from [place] [id number]. Topic: General - Other >> Feb 16, 2020 11:15 AM Celene Kras wrote: Reason for CRM: Pt called stating that she is needing to speak with Clifton James. She states that she is needing to have orange card recert done, but since her last one she has been laid off from her job and her boss is refusing to give her documentation. Please advise.

## 2020-02-17 NOTE — Telephone Encounter (Signed)
I return Pt call and reschedule her financial appt

## 2020-02-20 MED FILL — HYDROCHLOROTHIAZIDE 25 MG T: 25 | 30 days supply | Qty: 30 | Fill #2

## 2020-02-25 ENCOUNTER — Other Ambulatory Visit: Payer: Self-pay

## 2020-02-25 ENCOUNTER — Ambulatory Visit: Payer: Self-pay | Attending: Primary Care

## 2020-03-09 ENCOUNTER — Other Ambulatory Visit: Payer: Self-pay

## 2020-03-09 ENCOUNTER — Encounter (INDEPENDENT_AMBULATORY_CARE_PROVIDER_SITE_OTHER): Payer: Self-pay | Admitting: Primary Care

## 2020-03-09 ENCOUNTER — Other Ambulatory Visit (INDEPENDENT_AMBULATORY_CARE_PROVIDER_SITE_OTHER): Payer: Self-pay | Admitting: Primary Care

## 2020-03-09 ENCOUNTER — Telehealth (INDEPENDENT_AMBULATORY_CARE_PROVIDER_SITE_OTHER): Payer: Self-pay | Admitting: Primary Care

## 2020-03-09 DIAGNOSIS — M545 Low back pain, unspecified: Secondary | ICD-10-CM

## 2020-03-09 DIAGNOSIS — I10 Essential (primary) hypertension: Secondary | ICD-10-CM

## 2020-03-09 DIAGNOSIS — E782 Mixed hyperlipidemia: Secondary | ICD-10-CM

## 2020-03-09 DIAGNOSIS — Z760 Encounter for issue of repeat prescription: Secondary | ICD-10-CM

## 2020-03-09 MED ORDER — IBUPROFEN 800 MG PO TABS: 800.0000 mg | ORAL_TABLET | Freq: Three times a day (TID) | ORAL | 1 refills | Status: DC | PRN

## 2020-03-09 MED ORDER — PRAVASTATIN SODIUM 40 MG PO TABS
40.0000 mg | ORAL_TABLET | Freq: Every day | ORAL | 1 refills | Status: DC
  Filled 2020-07-07: qty 90, 90d supply, fill #0
  Filled 2020-07-07: qty 30, 30d supply, fill #0

## 2020-03-09 MED ORDER — HYDROCHLOROTHIAZIDE 25 MG PO TABS: 25.0000 mg | ORAL_TABLET | Freq: Every day | ORAL | 1 refills | Status: DC

## 2020-03-09 MED ORDER — AMLODIPINE BESYLATE 10 MG PO TABS: 10.0000 mg | ORAL_TABLET | Freq: Every day | ORAL | 1 refills | Status: DC

## 2020-03-09 MED FILL — AMLODIPINE BESYLATE 10 MG T: 10 | 30 days supply | Qty: 30 | Fill #0

## 2020-03-09 MED FILL — ?PRAVASTATIN NA 40MG TAB: 40 | 30 days supply | Qty: 30 | Fill #0

## 2020-03-09 MED FILL — IBUPROFEN 800 MG TABLET: 800 | 30 days supply | Qty: 90 | Fill #0

## 2020-03-09 NOTE — Progress Notes (Signed)
Has not been checking daily due to daughter being in car accident

## 2020-03-09 NOTE — Progress Notes (Signed)
Telephone Note  I connected with [name] on 03/09/20 at 11:10 AM EST by telephone and verified that I am speaking with the correct person using two identifiers.  Location: Patient: home  Provider: Kerin Perna @ [facility]   I discussed the limitations, risks, security and privacy concerns of performing an evaluation and management service by telephone and the availability of in person appointments. I also discussed with the patient that there may be a patient responsible charge related to this service. The patient expressed understanding and agreed to proceed.   History of Present Illness: [name] is 64 year old female having  Blood pressures follow up due to daughter in [place] in [place] she admits to taking her Bp medication but had not had the time to check her Bp like she should. (Daughter has 14 rib fx from MVA just brought her home yesterday. Increase stress , anxiety and depression.   Past Medical History:  Diagnosis Date   Hx of blood clots 1993   previously on Coumadin x 3 months in the past    Hyperlipidemia    Hypertension    Current Outpatient Medications on File Prior to Visit  Medication Sig Dispense Refill   acetaminophen (TYLENOL) 500 MG tablet Take 1,000 mg by mouth daily as needed for mild pain.     ALOE VERA PO Take by mouth.     amLODipine (NORVASC) 10 MG tablet Take 1 tablet (10 mg total) by mouth daily. 90 tablet 1   Ascorbic Acid (VITAMIN C PO) Take by mouth.     aspirin EC 81 MG tablet Take 1 tablet (81 mg total) by mouth daily. Swallow whole. 30 tablet 11   Cyanocobalamin (VITAMIN B 12 PO) Take by mouth.     ECHINACEA PO Take by mouth.     hydrochlorothiazide (HYDRODIURIL) 25 MG tablet Take 1 tablet (25 mg total) by mouth daily. 90 tablet 1   ibuprofen (ADVIL) 800 MG tablet Take 1 tablet (800 mg total) by mouth every 8 (eight) hours as needed. 90 tablet 1   pravastatin (PRAVACHOL) 40 MG tablet Take 1 tablet (40 mg total) by mouth  daily. 90 tablet 1   TURMERIC PO Take by mouth.     VITAMIN D PO Take by mouth.     No current facility-administered medications on file prior to visit.   Observations/Objective: Review of Systems  Musculoskeletal: Positive for back pain.  Psychiatric/Behavioral: Positive for depression. The patient is nervous/anxious.   All other systems reviewed and are negative.  Assessment and Plan: [name] was seen today for hypertension.  Diagnoses and all orders for this visit:  Essential hypertension Counseled on blood pressure goal of less than 130/80, low-sodium, DASH diet, medication compliance, 150 minutes of moderate intensity exercise per week. Discussed medication compliance, adverse effects. -     hydrochlorothiazide (HYDRODIURIL) 25 MG tablet; Take 1 tablet (25 mg total) by mouth daily. -     amLODipine (NORVASC) 10 MG tablet; Take 1 tablet (10 mg total) by mouth daily.  Medication refill -     ibuprofen (ADVIL) 800 MG tablet; Take 1 tablet (800 mg total) by mouth every 8 (eight) hours as needed. -     pravastatin (PRAVACHOL) 40 MG tablet; Take 1 tablet (40 mg total) by mouth daily. -     hydrochlorothiazide (HYDRODIURIL) 25 MG tablet; Take 1 tablet (25 mg total) by mouth daily. -     amLODipine (NORVASC) 10 MG tablet; Take 1 tablet (  10 mg total) by mouth daily.  Bilateral low back pain, unspecified chronicity, unspecified whether sciatica present -     ibuprofen (ADVIL) 800 MG tablet; Take 1 tablet (800 mg total) by mouth every 8 (eight) hours as needed.  Medication refill -     ibuprofen (ADVIL) 800 MG tablet; Take 1 tablet (800 mg total) by mouth every 8 (eight) hours as needed. -     pravastatin (PRAVACHOL) 40 MG tablet; Take 1 tablet (40 mg total) by mouth daily. -     hydrochlorothiazide (HYDRODIURIL) 25 MG tablet; Take 1 tablet (25 mg total) by mouth daily. -     amLODipine (NORVASC) 10 MG tablet; Take 1 tablet (10 mg total) by mouth daily.  Bilateral low back pain,  unspecified chronicity, unspecified whether sciatica present -     ibuprofen (ADVIL) 800 MG tablet; Take 1 tablet (800 mg total) by mouth every 8 (eight) hours as needed.  Mixed hyperlipidemia -     pravastatin (PRAVACHOL) 40 MG tablet; Take 1 tablet (40 mg total) by mouth daily.   Follow Up Instructions:    I discussed the assessment and treatment plan with the patient. The patient was provided an opportunity to ask questions and all were answered. The patient agreed with the plan and demonstrated an understanding of the instructions.   The patient was advised to call back or seek an in-person evaluation if the symptoms worsen or if the condition fails to improve as anticipated.  I provided 22 minutes of non-face-to-face time during this encounter.   Kerin Perna, NP

## 2020-04-28 ENCOUNTER — Ambulatory Visit (INDEPENDENT_AMBULATORY_CARE_PROVIDER_SITE_OTHER): Payer: Self-pay | Admitting: Primary Care

## 2020-04-28 ENCOUNTER — Other Ambulatory Visit (INDEPENDENT_AMBULATORY_CARE_PROVIDER_SITE_OTHER): Payer: Self-pay | Admitting: Primary Care

## 2020-04-28 ENCOUNTER — Encounter (INDEPENDENT_AMBULATORY_CARE_PROVIDER_SITE_OTHER): Payer: Self-pay | Admitting: Primary Care

## 2020-04-28 ENCOUNTER — Other Ambulatory Visit: Payer: Self-pay

## 2020-04-28 VITALS — BP 129/88 | HR 82 | Temp 97.7°F | Ht 66.5 in | Wt 201.0 lb

## 2020-04-28 DIAGNOSIS — M545 Low back pain, unspecified: Secondary | ICD-10-CM

## 2020-04-28 DIAGNOSIS — I10 Essential (primary) hypertension: Secondary | ICD-10-CM

## 2020-04-28 DIAGNOSIS — Z760 Encounter for issue of repeat prescription: Secondary | ICD-10-CM

## 2020-04-28 DIAGNOSIS — J320 Chronic maxillary sinusitis: Secondary | ICD-10-CM

## 2020-04-28 MED ORDER — METHOCARBAMOL 500 MG PO TABS: 500.0000 mg | ORAL_TABLET | Freq: Three times a day (TID) | ORAL | 1 refills | Status: DC | PRN

## 2020-04-28 MED ORDER — FLUTICASONE PROPIONATE 50 MCG/ACT NA SUSP: 2.0000 | Freq: Every day | NASAL | 6 refills | Status: DC

## 2020-04-28 MED ORDER — AMLODIPINE BESYLATE 10 MG PO TABS: 10.0000 mg | ORAL_TABLET | Freq: Every day | ORAL | 1 refills | Status: DC

## 2020-04-28 MED ORDER — HYDROCHLOROTHIAZIDE 25 MG PO TABS: 25.0000 mg | ORAL_TABLET | Freq: Every day | ORAL | 1 refills | Status: DC

## 2020-04-28 MED ORDER — ALBUTEROL SULFATE HFA 108 (90 BASE) MCG/ACT IN AERS: 2.0000 | INHALATION_SPRAY | Freq: Four times a day (QID) | RESPIRATORY_TRACT | 1 refills | Status: DC | PRN

## 2020-04-28 MED FILL — HYDROCHLOROTHIAZIDE 25 MG T: 25 | 30 days supply | Qty: 30 | Fill #0

## 2020-04-28 MED FILL — ALBUTEROL SULFATE HFA 108 (: 108 (90 BAS | 25 days supply | Qty: 18 | Fill #0

## 2020-04-28 MED FILL — AMLODIPINE BESYLATE 10 MG T: 10 | 30 days supply | Qty: 30 | Fill #1

## 2020-04-28 MED FILL — METHOCARBAMOL 500 MG TABS: 500 | 30 days supply | Qty: 90 | Fill #0

## 2020-04-28 MED FILL — FLUTICASONE PROP 50 MCG SPR: 50 | 30 days supply | Qty: 16 | Fill #0

## 2020-04-28 NOTE — Patient Instructions (Signed)
Use Afrin nasal spray (generic) 1 spray in each nasal twice a day do not use more than 3 day.   Albuterol Metered Dose Inhaler (MDI) What is this medicine? ALBUTEROL (al Normajean Glasgow) is a bronchodilator. It treats bronchospasm. Bronchospasm is when you have trouble breathing and make loud or whistling sounds when you breathe. This drug opens the airways in the lungs so it is easier to breathe. This medicine may be used for other purposes; ask your health care provider or pharmacist if you have questions. COMMON BRAND NAME(S): Proair HFA, Proventil, Proventil HFA, Respirol, Ventolin, Ventolin HFA What should I tell my health care provider before I take this medicine? They need to know if you have any of the following conditions:  diabetes (high blood sugar)  heart disease  high blood pressure  irregular heartbeat or rhythm  pheochromocytoma  seizures  thyroid disease  an unusual or allergic reaction to albuterol, levalbuterol, other medicines, foods, dyes, or preservatives  pregnant or trying to get pregnant  breast-feeding How should I use this medicine? This medicine is inhaled through the mouth. Take it as directed on the prescription label. Do not use it more often than directed. This medicine comes with INSTRUCTIONS FOR USE. Ask your pharmacist for directions on how to use this medicine. Read the information carefully. Talk to your pharmacist or health care provider if you have questions. Talk to your health care provider about the use of this medicine in children. While it may be given to children for selected conditions, precautions do apply. Overdosage: If you think you have taken too much of this medicine contact a poison control center or emergency room at once. NOTE: This medicine is only for you. Do not share this medicine with others. What if I miss a dose? If you take this medication on a regular basis, take it as soon as you can. If it is almost time for your next  dose, take only that dose. Do not take double or extra doses. What may interact with this medicine?  anti-infectives like chloroquine and pentamidine  caffeine  cisapride  diuretics  medicines for colds  medicines for depression or for emotional or psychotic conditions  medicines for weight loss including some herbal products  methadone  some antibiotics like clarithromycin, erythromycin, levofloxacin, and linezolid  some heart medicines  steroid hormones like dexamethasone, cortisone, hydrocortisone  theophylline  thyroid hormones This list may not describe all possible interactions. Give your health care provider a list of all the medicines, herbs, non-prescription drugs, or dietary supplements you use. Also tell them if you smoke, drink alcohol, or use illegal drugs. Some items may interact with your medicine. What should I watch for while using this medicine? Visit your health care provider for regular checks on your progress. Tell your health care provider if your symptoms do not start to get better or if they get worse. If your symptoms get worse or if you are using this drug more than normal, call your health care provider right away. Do not treat yourself for coughs, colds or allergies without asking your health care provider for advice. Some nonprescription medicines can affect this one. You and your health care provider should develop an Asthma Action Plan that is just for you. Be sure to know what to do if you are in the yellow (asthma is getting worse) or red (medical alert) zones. Your mouth may get dry. Chewing sugarless gum or sucking hard candy and drinking plenty of water  may help. Contact your health care provider if the problem does not go away or is severe. What side effects may I notice from receiving this medicine? Side effects that you should report to your doctor or health care professional as soon as possible:  allergic reactions (skin rash, itching or  hives; swelling of the face, lips, or tongue)  fever  heartbeat rhythm changes (trouble breathing; chest pain; dizziness; fast, irregular heartbeat; feeling faint or lightheaded, falls)  increase in blood pressure  muscle cramps, pain  muscle weakness  pain, tingling, numbness in the hands or feet  vomiting Side effects that usually do not require medical attention (report to your doctor or health care professional if they continue or are bothersome):  change in taste  cough  dry mouth  headache  nasal congestion (runny or stuffy nose)  sore throat  tremors  trouble sleeping  upset stomach This list may not describe all possible side effects. Call your doctor for medical advice about side effects. You may report side effects to FDA at 1-800-FDA-1088. Where should I keep my medicine? Keep out of the reach of children and pets. Store at room temperature between 20 and 25 degrees C (68 and 77 degrees F). Keep inhaler away from extreme heat and cold. Get rid of it when the dose counter reads 0 or after the expiration date, whichever is first. NOTE: This sheet is a summary. It may not cover all possible information. If you have questions about this medicine, talk to your doctor, pharmacist, or health care provider.  2021 Elsevier/Gold Standard (2020-03-05 10:51:44)

## 2020-04-28 NOTE — Progress Notes (Signed)
[place]   [name] is presents for  hypertension evaluation, on previous visit medication was not adjusted refilled amlodipine 10mg  and HCTZ 25mg  - taking daily  Patient reports adherence with medications. 568/12- 751/70 (systolic 017-494 ) diastolic (49-67)  Current Medication List Current Outpatient Medications on File Prior to Visit  Medication Sig Dispense Refill  . acetaminophen (TYLENOL) 500 MG tablet Take 1,000 mg by mouth daily as needed for mild pain.    . ALOE VERA PO Take by mouth.    Marland Kitchen amLODipine (NORVASC) 10 MG tablet Take 1 tablet (10 mg total) by mouth daily. 90 tablet 1  . Ascorbic Acid (VITAMIN C PO) Take by mouth.    Marland Kitchen aspirin EC 81 MG tablet Take 1 tablet (81 mg total) by mouth daily. Swallow whole. 30 tablet 11  . Cyanocobalamin (VITAMIN B 12 PO) Take by mouth.    . ECHINACEA PO Take by mouth.    . hydrochlorothiazide (HYDRODIURIL) 25 MG tablet Take 1 tablet (25 mg total) by mouth daily. 90 tablet 1  . ibuprofen (ADVIL) 800 MG tablet Take 1 tablet (800 mg total) by mouth every 8 (eight) hours as needed. 90 tablet 1  . pravastatin (PRAVACHOL) 40 MG tablet Take 1 tablet (40 mg total) by mouth daily. 90 tablet 1  . TURMERIC PO Take by mouth.    Marland Kitchen VITAMIN D PO Take by mouth.     No current facility-administered medications on file prior to visit.   Past Medical History  Current Outpatient Medications on File Prior to Visit  Medication Sig Dispense Refill  . acetaminophen (TYLENOL) 500 MG tablet Take 1,000 mg by mouth daily as needed for mild pain.    . ALOE VERA PO Take by mouth.    Marland Kitchen amLODipine (NORVASC) 10 MG tablet Take 1 tablet (10 mg total) by mouth daily. 90 tablet 1  . Ascorbic Acid (VITAMIN C PO) Take by mouth.    Marland Kitchen aspirin EC 81 MG tablet Take 1 tablet (81 mg total) by mouth daily. Swallow whole. 30 tablet 11  . Cyanocobalamin (VITAMIN B 12 PO) Take by mouth.    . ECHINACEA PO Take by mouth.    . hydrochlorothiazide (HYDRODIURIL)  25 MG tablet Take 1 tablet (25 mg total) by mouth daily. 90 tablet 1  . ibuprofen (ADVIL) 800 MG tablet Take 1 tablet (800 mg total) by mouth every 8 (eight) hours as needed. 90 tablet 1  . pravastatin (PRAVACHOL) 40 MG tablet Take 1 tablet (40 mg total) by mouth daily. 90 tablet 1  . TURMERIC PO Take by mouth.    Marland Kitchen VITAMIN D PO Take by mouth.     No current facility-administered medications on file prior to visit.    Dietary habits include: Dash diet some what  Exercise habits include:walking  Family / Social history: NO   O:  Physical Exam Vitals reviewed.  Constitutional:      Appearance: She is obese.  HENT:     Head: Normocephalic.     Right Ear: External ear normal.     Left Ear: External ear normal.     Nose: Nose normal.  Eyes:     Extraocular Movements: Extraocular movements intact.     Pupils: Pupils are equal, round, and reactive to light.  Cardiovascular:     Rate and Rhythm: Normal rate and regular rhythm.  Pulmonary:     Effort: Pulmonary effort is normal.     Breath sounds: Normal breath sounds.  Abdominal:     General: Bowel sounds are normal. There is distension.     Palpations: Abdomen is soft.  Musculoskeletal:        General: Normal range of motion.     Cervical back: Normal range of motion.  Skin:    General: Skin is warm and dry.  Neurological:     Mental Status: She is alert and oriented to person, place, and time.  Psychiatric:        Mood and Affect: Mood normal.        Behavior: Behavior normal.        Thought Content: Thought content normal.        Judgment: Judgment normal.      Review of Systems  All other systems reviewed and are negative.   Last 3 Office BP readings: BP Readings from Last 3 Encounters:  04/28/20 129/88  09/24/19 137/89  05/29/19 (!) 186/114    BMET    Component Value Date/Time   NA 138 03/06/2018 1224   NA 138 08/30/2017 1605   K 3.5 03/06/2018 1224   CL 100 03/06/2018 1224   CO2 26 03/06/2018 1224    GLUCOSE 115 (H) 03/06/2018 1224   BUN 15 03/06/2018 1224   BUN 12 08/30/2017 1605   CREATININE 0.94 03/06/2018 1224   CREATININE 1.15 (H) 07/01/2012 1558   CALCIUM 9.3 03/06/2018 1224   GFRNONAA >60 03/06/2018 1224   GFRAA >60 03/06/2018 1224    Renal function: CrCl cannot be calculated (Patient's most recent lab result is older than the maximum 21 days allowed.).  Clinical ASCVD: Yes  The 10-year ASCVD risk score Mikey Bussing DC Jr., et al., 2013) is: 24.6%   Values used to calculate the score:     Age: 65 years     Sex: Female     Is Non-Hispanic African American: Yes     Diabetic: Yes     Tobacco smoker: No     Systolic Blood Pressure: [id number] mmHg     Is BP treated: Yes     HDL Cholesterol: 55 mg/dL     Total Cholesterol: 260 mg/dL   A/P:  Essential hypertension current medications. BP Goal = 130/80 mmHg -     hydrochlorothiazide (HYDRODIURIL) 25 MG tablet; Take 1 tablet (25 mg total) by mouth daily. -     amLODipine (NORVASC) 10 MG tablet; Take 1 tablet (10 mg total) by mouth daily. Counseled on lifestyle modifications for blood pressure control including reduced dietary sodium, increased exercise, adequate sleep   Hypertension longstanding diagnosed currently amlodipine  on . Patient is adherent with current medications.  [name] was seen today for blood pressure check.   Chronic maxillary sinusitis -     fluticasone (FLONASE) 50 MCG/ACT nasal spray; Place 2 sprays into both nostrils daily. -     albuterol (PROVENTIL HFA) 108 (90 Base) MCG/ACT inhaler; Inhale 2 puffs into the lungs every 6 (six) hours as needed for wheezing or shortness of breath.  Medication refill -     hydrochlorothiazide (HYDRODIURIL) 25 MG tablet; Take 1 tablet (25 mg total) by mouth daily. -     amLODipine (NORVASC) 10 MG tablet; Take 1 tablet (10 mg total) by mouth daily.  Bilateral low back pain, unspecified chronicity, unspecified whether sciatica present -     methocarbamol (ROBAXIN) 500 MG tablet;  Take 1 tablet (500 mg total) by mouth every 8 (eight) hours as needed for muscle spasms.   Current Outpatient Medications on File Prior  to Visit  Medication Sig Dispense Refill  . acetaminophen (TYLENOL) 500 MG tablet Take 1,000 mg by mouth daily as needed for mild pain.    . ALOE VERA PO Take by mouth.    . Ascorbic Acid (VITAMIN C PO) Take by mouth.    Marland Kitchen aspirin EC 81 MG tablet Take 1 tablet (81 mg total) by mouth daily. Swallow whole. 30 tablet 11  . Cyanocobalamin (VITAMIN B 12 PO) Take by mouth.    . ECHINACEA PO Take by mouth.    Marland Kitchen ibuprofen (ADVIL) 800 MG tablet Take 1 tablet (800 mg total) by mouth every 8 (eight) hours as needed. 90 tablet 1  . pravastatin (PRAVACHOL) 40 MG tablet Take 1 tablet (40 mg total) by mouth daily. 90 tablet 1  . TURMERIC PO Take by mouth.    Marland Kitchen VITAMIN D PO Take by mouth.     No current facility-administered medications on file prior to visit.   -F/u labs ordered - no -Kerin Perna

## 2020-04-30 ENCOUNTER — Other Ambulatory Visit (INDEPENDENT_AMBULATORY_CARE_PROVIDER_SITE_OTHER): Payer: Self-pay

## 2020-04-30 ENCOUNTER — Other Ambulatory Visit: Payer: Self-pay

## 2020-04-30 DIAGNOSIS — E782 Mixed hyperlipidemia: Secondary | ICD-10-CM

## 2020-04-30 DIAGNOSIS — I10 Essential (primary) hypertension: Secondary | ICD-10-CM

## 2020-04-30 DIAGNOSIS — R7303 Prediabetes: Secondary | ICD-10-CM

## 2020-04-30 DIAGNOSIS — R5383 Other fatigue: Secondary | ICD-10-CM

## 2020-05-01 LAB — CMP14+EGFR
ALT: 14 IU/L (ref 0–32)
AST: 16 IU/L (ref 0–40)
Albumin/Globulin Ratio: 1.4 (ref 1.2–2.2)
Albumin: 4.7 g/dL (ref 3.8–4.8)
Alkaline Phosphatase: 56 IU/L (ref 44–121)
BUN/Creatinine Ratio: 21 (ref 12–28)
BUN: 15 mg/dL (ref 8–27)
Bilirubin Total: 0.3 mg/dL (ref 0.0–1.2)
CO2: 22 mmol/L (ref 20–29)
Calcium: 9.3 mg/dL (ref 8.7–10.3)
Chloride: 101 mmol/L (ref 96–106)
Creatinine, Ser: 0.72 mg/dL (ref 0.57–1.00)
GFR calc Af Amer: 102 mL/min/{1.73_m2} (ref >59)
GFR calc non Af Amer: 89 mL/min/{1.73_m2} (ref >59)
Globulin, Total: 3.3 g/dL (ref 1.5–4.5)
Glucose: 112 mg/dL — ABNORMAL HIGH (ref 65–99)
Potassium: 4 mmol/L (ref 3.5–5.2)
Sodium: 140 mmol/L (ref 134–144)
Total Protein: 8 g/dL (ref 6.0–8.5)

## 2020-05-01 LAB — LIPID PANEL
Chol/HDL Ratio: 5.4 ratio — ABNORMAL HIGH (ref 0.0–4.4)
Cholesterol, Total: 231 mg/dL — ABNORMAL HIGH (ref 100–199)
HDL: 43 mg/dL (ref >39)
LDL Chol Calc (NIH): 159 mg/dL — ABNORMAL HIGH (ref 0–99)
Triglycerides: 159 mg/dL — ABNORMAL HIGH (ref 0–149)
VLDL Cholesterol Cal: 29 mg/dL (ref 5–40)

## 2020-05-01 LAB — TSH+FREE T4
Free T4: 0.99 ng/dL (ref 0.82–1.77)
TSH: 1.91 u[IU]/mL (ref 0.450–4.500)

## 2020-05-01 LAB — HEMOGLOBIN A1C
Est. average glucose Bld gHb Est-mCnc: 123 mg/dL
Hgb A1c MFr Bld: 5.9 % — ABNORMAL HIGH (ref 4.8–5.6)

## 2020-05-01 LAB — VITAMIN B12: Vitamin B-12: 1236 pg/mL (ref 232–1245)

## 2020-05-01 LAB — VITAMIN D 25 HYDROXY (VIT D DEFICIENCY, FRACTURES): Vit D, 25-Hydroxy: 40.2 ng/mL (ref 30.0–100.0)

## 2020-05-11 MED FILL — IBUPROFEN 800 MG TABLET: 800 | 30 days supply | Qty: 90 | Fill #1

## 2020-05-11 MED FILL — ?PRAVASTATIN NA 40MG TAB: 40 | 30 days supply | Qty: 30 | Fill #1

## 2020-07-03 ENCOUNTER — Other Ambulatory Visit: Payer: Self-pay

## 2020-07-07 ENCOUNTER — Other Ambulatory Visit: Payer: Self-pay

## 2020-07-09 ENCOUNTER — Other Ambulatory Visit: Payer: Self-pay

## 2020-07-27 ENCOUNTER — Other Ambulatory Visit: Payer: Self-pay

## 2020-07-27 ENCOUNTER — Ambulatory Visit (INDEPENDENT_AMBULATORY_CARE_PROVIDER_SITE_OTHER): Payer: Self-pay | Admitting: Primary Care

## 2020-07-27 MED FILL — Amlodipine Besylate Tab 10 MG (Base Equivalent): ORAL | 30 days supply | Qty: 30 | Fill #0 | Status: AC

## 2020-07-29 ENCOUNTER — Other Ambulatory Visit: Payer: Self-pay

## 2020-08-03 ENCOUNTER — Other Ambulatory Visit: Payer: Self-pay

## 2020-08-03 ENCOUNTER — Ambulatory Visit (INDEPENDENT_AMBULATORY_CARE_PROVIDER_SITE_OTHER): Payer: Self-pay | Admitting: Primary Care

## 2020-08-03 ENCOUNTER — Encounter (INDEPENDENT_AMBULATORY_CARE_PROVIDER_SITE_OTHER): Payer: Self-pay | Admitting: Primary Care

## 2020-08-03 VITALS — BP 134/87 | HR 74 | Temp 97.3°F | Ht 66.5 in | Wt 193.2 lb

## 2020-08-03 DIAGNOSIS — Z760 Encounter for issue of repeat prescription: Secondary | ICD-10-CM

## 2020-08-03 DIAGNOSIS — I10 Essential (primary) hypertension: Secondary | ICD-10-CM

## 2020-08-03 DIAGNOSIS — E782 Mixed hyperlipidemia: Secondary | ICD-10-CM

## 2020-08-03 DIAGNOSIS — M545 Low back pain, unspecified: Secondary | ICD-10-CM

## 2020-08-03 MED ORDER — AMLODIPINE BESYLATE 10 MG PO TABS
ORAL_TABLET | Freq: Every day | ORAL | 1 refills | Status: DC
  Filled 2020-08-03: qty 90, 90d supply, fill #0
  Filled 2020-08-31: qty 30, 30d supply, fill #0
  Filled 2020-10-06: qty 30, 30d supply, fill #1

## 2020-08-03 MED ORDER — PRAVASTATIN SODIUM 40 MG PO TABS
40.0000 mg | ORAL_TABLET | Freq: Every day | ORAL | 1 refills | Status: DC
  Filled 2020-08-03 – 2020-08-16 (×2): qty 90, 90d supply, fill #0
  Filled 2020-08-17: qty 30, 30d supply, fill #0
  Filled 2020-10-06: qty 30, 30d supply, fill #1
  Filled 2020-11-05: qty 30, 30d supply, fill #2
  Filled 2020-12-09: qty 30, 30d supply, fill #3

## 2020-08-03 MED ORDER — IBUPROFEN 800 MG PO TABS
ORAL_TABLET | Freq: Three times a day (TID) | ORAL | 1 refills | Status: AC | PRN
  Filled 2020-08-03: qty 90, 30d supply, fill #0

## 2020-08-03 NOTE — Patient Instructions (Signed)

## 2020-08-03 NOTE — Progress Notes (Signed)
Subjective:    Patient here for follow-up of elevated blood pressure.  She is exercising and is adherent to a low-salt diet.  Blood pressure is well controlled at home. Cardiac symptoms: none. Patient denies: chest pain, dyspnea, irregular heart beat, near-syncope and palpitations. Cardiovascular risk factors: advanced age (older than 66 for men, 86 for women), dyslipidemia, hypertension and obesity (BMI >= 30 kg/m2). Use of agents associated with hypertension: NSAIDS. History of target organ damage: none.  The following portions of the patient's history were reviewed and updated as appropriate: allergies, past family history, past social history, past surgical history and problem list.  Review of Systems A comprehensive review of systems was negative.     Objective:  BP 134/87 (BP Location: Right Arm, Patient Position: Sitting, Cuff Size: Normal)   Pulse 74   Temp (!) 97.3 F (36.3 C) (Temporal)   Ht 5' 6.5" (1.689 m)   Wt 193 lb 3.2 oz (87.6 kg)   SpO2 97%   BMI 30.72 kg/m   Assessment:   General: Vital signs reviewed.  Patient is well-developed and well-nourished, obese female in no acute distress and cooperative with exam.  Head: Normocephalic and atraumatic. Eyes: EOMI, conjunctivae normal, no scleral icterus.  Neck: Supple, trachea midline, normal ROM, no JVD, masses, thyromegaly, or carotid bruit present.  Cardiovascular: RRR, S1 normal, S2 normal, no murmurs, gallops, or rubs. Pulmonary/Chest: Clear to auscultation bilaterally Abdominal: Soft, non-tender, non-distended, BS +, no masses, organomegaly, or guarding present.  Musculoskeletal: No joint deformities, erythema, or stiffness, ROM full and nontender. Extremities: No lower extremity edema bilaterally,  pulses symmetric and intact bilaterally. No cyanosis or clubbing. Neurological: A&O x3, Skin: Warm, dry and intact. No rashes or erythema. Psychiatric: Normal mood and affect. speech and behavior is normal. Cognition  and memory are normal.    Plan:  [name] was seen today for hypertension.  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  Essential hypertension Counseled on blood pressure goal of less than 130/80, low-sodium, DASH diet, medication compliance, 150 minutes of moderate intensity exercise per week. Discussed medication compliance, adverse effects.. -     amLODipine (NORVASC) 10 MG tablet; TAKE 1 TABLET (10 MG TOTAL) BY MOUTH DAILY.  Medication refill -     pravastatin (PRAVACHOL) 40 MG tablet; Take 1 tablet (40 mg total) by mouth daily. -     amLODipine (NORVASC) 10 MG tablet; TAKE 1 TABLET (10  MG TOTAL) BY MOUTH DAILY. -     ibuprofen (ADVIL) 800 MG tablet; TAKE 1 TABLET (800 MG TOTAL) BY MOUTH EVERY 8 (EIGHT) HOURS AS NEEDED.  Mixed hyperlipidemia Future Lipid   Decrease your fatty foods, red meat, cheese, milk and increase fiber like whole grains and veggies. Admits problem is fried foods.   pravastatin (PRAVACHOL) 40 MG tablet; Take 1 tablet (40 mg total) by mouth daily.  Kerin Perna

## 2020-08-04 ENCOUNTER — Other Ambulatory Visit: Payer: Self-pay

## 2020-08-09 ENCOUNTER — Other Ambulatory Visit: Payer: Self-pay

## 2020-08-16 ENCOUNTER — Other Ambulatory Visit: Payer: Self-pay

## 2020-08-17 ENCOUNTER — Other Ambulatory Visit: Payer: Self-pay

## 2020-08-31 ENCOUNTER — Other Ambulatory Visit: Payer: Self-pay

## 2020-09-01 ENCOUNTER — Ambulatory Visit (INDEPENDENT_AMBULATORY_CARE_PROVIDER_SITE_OTHER): Payer: Self-pay | Admitting: Primary Care

## 2020-09-01 ENCOUNTER — Other Ambulatory Visit: Payer: Self-pay

## 2020-09-01 ENCOUNTER — Other Ambulatory Visit (INDEPENDENT_AMBULATORY_CARE_PROVIDER_SITE_OTHER): Payer: Self-pay

## 2020-09-24 ENCOUNTER — Ambulatory Visit (INDEPENDENT_AMBULATORY_CARE_PROVIDER_SITE_OTHER): Payer: Self-pay | Admitting: Primary Care

## 2020-10-06 ENCOUNTER — Other Ambulatory Visit: Payer: Self-pay

## 2020-10-07 ENCOUNTER — Other Ambulatory Visit: Payer: Self-pay

## 2020-10-11 ENCOUNTER — Ambulatory Visit (INDEPENDENT_AMBULATORY_CARE_PROVIDER_SITE_OTHER): Payer: Self-pay | Admitting: Primary Care

## 2020-10-13 ENCOUNTER — Other Ambulatory Visit: Payer: Self-pay

## 2020-10-13 ENCOUNTER — Ambulatory Visit (INDEPENDENT_AMBULATORY_CARE_PROVIDER_SITE_OTHER): Payer: Medicaid Other | Admitting: Primary Care

## 2020-10-13 ENCOUNTER — Telehealth: Payer: Self-pay

## 2020-10-13 ENCOUNTER — Encounter (INDEPENDENT_AMBULATORY_CARE_PROVIDER_SITE_OTHER): Payer: Self-pay | Admitting: Primary Care

## 2020-10-13 VITALS — BP 132/88 | HR 72 | Temp 97.5°F | Ht 66.5 in | Wt 191.4 lb

## 2020-10-13 DIAGNOSIS — Z760 Encounter for issue of repeat prescription: Secondary | ICD-10-CM

## 2020-10-13 DIAGNOSIS — E119 Type 2 diabetes mellitus without complications: Secondary | ICD-10-CM | POA: Diagnosis not present

## 2020-10-13 DIAGNOSIS — I10 Essential (primary) hypertension: Secondary | ICD-10-CM | POA: Diagnosis not present

## 2020-10-13 DIAGNOSIS — J302 Other seasonal allergic rhinitis: Secondary | ICD-10-CM

## 2020-10-13 DIAGNOSIS — Z1211 Encounter for screening for malignant neoplasm of colon: Secondary | ICD-10-CM

## 2020-10-13 DIAGNOSIS — R7303 Prediabetes: Secondary | ICD-10-CM | POA: Diagnosis not present

## 2020-10-13 DIAGNOSIS — Z13220 Encounter for screening for lipoid disorders: Secondary | ICD-10-CM

## 2020-10-13 DIAGNOSIS — R5383 Other fatigue: Secondary | ICD-10-CM

## 2020-10-13 MED ORDER — ALBUTEROL SULFATE HFA 108 (90 BASE) MCG/ACT IN AERS
2.0000 | INHALATION_SPRAY | Freq: Four times a day (QID) | RESPIRATORY_TRACT | 1 refills | Status: DC | PRN
  Filled 2020-10-13: qty 8.5, 25d supply, fill #0

## 2020-10-13 MED ORDER — AMLODIPINE BESYLATE 10 MG PO TABS
ORAL_TABLET | Freq: Every day | ORAL | 1 refills | Status: DC
  Filled 2020-10-13: qty 90, fill #0
  Filled 2020-11-05: qty 90, 90d supply, fill #0

## 2020-10-13 MED ORDER — HYDROCHLOROTHIAZIDE 25 MG PO TABS
ORAL_TABLET | Freq: Every day | ORAL | 1 refills | Status: DC
  Filled 2020-10-13: qty 30, 30d supply, fill #0

## 2020-10-13 MED ORDER — FLUTICASONE PROPIONATE 50 MCG/ACT NA SUSP
2.0000 | Freq: Every day | NASAL | 6 refills | Status: DC
  Filled 2020-10-13: qty 16, 30d supply, fill #0

## 2020-10-13 MED ORDER — LORATADINE 10 MG PO TABS
10.0000 mg | ORAL_TABLET | Freq: Every day | ORAL | 11 refills | Status: DC
  Filled 2020-10-13 (×2): qty 30, 30d supply, fill #0

## 2020-10-13 NOTE — Progress Notes (Signed)
[place]   [name] is a 65 y.o. female presents for hypertension evaluation, Denies shortness of breath, headaches, chest pain or lower extremity edema, sudden onset, vision changes, unilateral weakness, dizziness, paresthesias   Patient reports adherence with medications.  Dietary habits include: monitoring sodium intake  Exercise habits include: walking  Family / Social history: None   Past Medical History:  Diagnosis Date   Hx of blood clots 1993   previously on Coumadin x 3 months in the past    Hyperlipidemia    Hypertension    No past surgical history on file. Allergies  Allergen Reactions   Lisinopril     Suspect ACE inhibitor allergy as patient creatinine nearly doubled after initiation of lisinopril   Current Outpatient Medications on File Prior to Visit  Medication Sig Dispense Refill   acetaminophen (TYLENOL) 500 MG tablet Take 1,000 mg by mouth daily as needed for mild pain.     albuterol (VENTOLIN HFA) 108 (90 Base) MCG/ACT inhaler INHALE 2 PUFFS INTO THE LUNGS EVERY 6 (SIX) HOURS AS NEEDED FOR WHEEZING OR SHORTNESS OF BREATH. 18 g 1   ALOE VERA PO Take by mouth.     amLODipine (NORVASC) 10 MG tablet TAKE 1 TABLET (10 MG TOTAL) BY MOUTH DAILY. 90 tablet 1   Ascorbic Acid (VITAMIN C PO) Take by mouth.     aspirin EC 81 MG tablet Take 1 tablet (81 mg total) by mouth daily. Swallow whole. 30 tablet 11   Cyanocobalamin (VITAMIN B 12 PO) Take by mouth.     ECHINACEA PO Take by mouth.     fluticasone (FLONASE) 50 MCG/ACT nasal spray PLACE 2 SPRAYS INTO BOTH NOSTRILS DAILY. 16 g 6   hydrochlorothiazide (HYDRODIURIL) 25 MG tablet TAKE 1 TABLET (25 MG TOTAL) BY MOUTH DAILY. 90 tablet 1   ibuprofen (ADVIL) 800 MG tablet TAKE 1 TABLET (800 MG TOTAL) BY MOUTH EVERY 8 (EIGHT) HOURS AS NEEDED. 90 tablet 1   methocarbamol (ROBAXIN) 500 MG tablet TAKE 1 TABLET (500 MG TOTAL) BY MOUTH EVERY 8 (EIGHT) HOURS AS NEEDED FOR MUSCLE SPASMS. 90 tablet 1    pravastatin (PRAVACHOL) 40 MG tablet Take 1 tablet (40 mg total) by mouth daily. 90 tablet 1   TURMERIC PO Take by mouth.     VITAMIN D PO Take by mouth.     No current facility-administered medications on file prior to visit.   Social History   Socioeconomic History   Marital status: Single    Spouse name: Not on file   Number of children: Not on file   Years of education: Not on file   Highest education level: Not on file  Occupational History   Not on file  Tobacco Use   Smoking status: Never   Smokeless tobacco: Never  Vaping Use   Vaping Use: Never used  Substance and Sexual Activity   Alcohol use: Yes    Alcohol/week: 2.0 standard drinks    Types: 2 Cans of beer per week   Drug use: No   Sexual activity: Not Currently  Other Topics Concern   Not on file  Social History Narrative   Not on file   Social Determinants of Health   Financial Resource Strain: Not on file  Food Insecurity: Not on file  Transportation Needs: Not on file  Physical Activity: Not on file  Stress: Not on file  Social Connections: Not on file  Intimate Partner Violence: Not on file   No  family history on file.   OBJECTIVE:  Vitals:   10/13/20 1000  BP: 132/88  Pulse: 72  Temp: (!) 97.5 F (36.4 C)  TempSrc: Temporal  SpO2: 96%  Weight: 191 lb 6.4 oz (86.8 kg)  Height: 5' 6.5" (1.689 m)    Physical Exam Vitals reviewed.  Constitutional:      Appearance: She is obese.  HENT:     Head: Normocephalic.     Right Ear: Tympanic membrane and external ear normal.     Left Ear: Tympanic membrane and external ear normal.     Nose: Nose normal.  Eyes:     Extraocular Movements: Extraocular movements intact.     Pupils: Pupils are equal, round, and reactive to light.  Cardiovascular:     Rate and Rhythm: Normal rate and regular rhythm.  Pulmonary:     Effort: Pulmonary effort is normal.     Breath sounds: Normal breath sounds.  Abdominal:     General: Bowel sounds are  normal. There is distension.     Palpations: Abdomen is soft.  Musculoskeletal:        General: Normal range of motion.     Cervical back: Normal range of motion and neck supple.  Skin:    General: Skin is warm and dry.  Neurological:     Mental Status: She is alert and oriented to person, place, and time.  Psychiatric:        Mood and Affect: Mood normal.        Behavior: Behavior normal.        Thought Content: Thought content normal.        Judgment: Judgment normal.     Review of Systems  Constitutional:  Positive for malaise/fatigue.  Cardiovascular:  Positive for leg swelling.       After standing periods of time feet swelling   All other systems reviewed and are negative.  Last 3 Office BP readings: BP Readings from Last 3 Encounters:  10/13/20 132/88  08/03/20 134/87  04/28/20 129/88    BMET    Component Value Date/Time   NA 140 04/30/2020 0859   K 4.0 04/30/2020 0859   CL 101 04/30/2020 0859   CO2 22 04/30/2020 0859   GLUCOSE 112 (H) 04/30/2020 0859   GLUCOSE 115 (H) 03/06/2018 1224   BUN 15 04/30/2020 0859   CREATININE 0.72 04/30/2020 0859   CREATININE 1.15 (H) 07/01/2012 1558   CALCIUM 9.3 04/30/2020 0859   GFRNONAA 89 04/30/2020 0859   GFRAA 102 04/30/2020 0859    Renal function: CrCl cannot be calculated (Patient's most recent lab result is older than the maximum 21 days allowed.).  Clinical ASCVD: Yes  The 10-year ASCVD risk score Mikey Bussing DC Jr., et al., 2013) is: 26%   Values used to calculate the score:     Age: 49 years     Sex: Female     Is Non-Hispanic African American: Yes     Diabetic: Yes     Tobacco smoker: No     Systolic Blood Pressure: 453 mmHg     Is BP treated: Yes     HDL Cholesterol: 43 mg/dL     Total Cholesterol: 231 mg/dL  ASCVD risk factors include- [place]   ASSESSMENT & PLAN:  No diagnosis found.  No orders of the defined types were placed in this encounter.   -Counseled on lifestyle modifications for blood  pressure control including reduced dietary sodium, increased exercise, weight reduction and adequate sleep. Also,  educated patient about the risk for cardiovascular events, stroke and heart attack. Also counseled patient about the importance of medication adherence. If you participate in smoking, it is important to stop using tobacco as this will increase the risks associated with uncontrolled blood pressure.   -Hypertension longstanding diagnosed currently amlodipine 63m and HCTZ 238mdaily  on current medications. Patient is adherent with current medications.   Goal BP:  For patients younger than 60: Goal BP < 130/80. For patients 60 and older: Goal BP < 140/90. For patients with diabetes: Goal BP < 130/80. Your most recent BP: 132/88  Minimize salt intake. Minimize alcohol intake MaLarceniaas seen today for hypertension.  Diagnoses and all orders for this visit:  Essential hypertension -     CMP14+EGFR -     hydrochlorothiazide (HYDRODIURIL) 25 MG tablet; TAKE 1 TABLET (25 MG TOTAL) BY MOUTH DAILY. -     amLODipine (NORVASC) 10 MG tablet; TAKE 1 TABLET (10 MG TOTAL) BY MOUTH DAILY.  Medication refill -     hydrochlorothiazide (HYDRODIURIL) 25 MG tablet; TAKE 1 TABLET (25 MG TOTAL) BY MOUTH DAILY. -     amLODipine (NORVASC) 10 MG tablet; TAKE 1 TABLET (10 MG TOTAL) BY MOUTH DAILY.  Colon cancer screening -     Ambulatory referral to Gastroenterology  Prediabetes Discuss monitoring foods that are high in carbohydrates are the following rice, potatoes, breads, sugars, and pastas.  Reduction in the intake (eating) will assist in lowering your blood sugars.  -     CBC with Differential -     Microalbumin, urine -     Ambulatory referral to Ophthalmology  Lipid screening -     Lipid Panel  Fatigue, unspecified type -     TSH + free T4  [facility] [place] [facility] [place]   Seasonal allergies -     fluticasone (FLONASE) 50 MCG/ACT nasal spray; PLACE 2 SPRAYS INTO  BOTH NOSTRILS DAILY. -     albuterol (VENTOLIN HFA) 108 (90 Base) MCG/ACT inhaler; INHALE 2 PUFFS INTO THE LUNGS EVERY 6 (SIX) HOURS AS NEEDED FOR WHEEZING OR SHORTNESS OF BREATH.  Other orders -     loratadine (CLARITIN) 10 MG tablet; Take 1 tablet (10 mg total) by mouth daily.     This note has been created with DrSurveyor, quantityAny transcriptional errors are unintentional.   MiKerin PernaNP 10/13/2020, 10:06 AM

## 2020-10-13 NOTE — Telephone Encounter (Signed)
Lmom for patient to call back and let us know if she would like to attend 1 of the mobile mammogram events. Left detail message that event will be on August 24th and September 21st.

## 2020-10-13 NOTE — Patient Instructions (Signed)
This will help circulation and decrease swelling in your feet. How many hours should you wear compression stockings? You should wear your compression stockings during the day and take them off before going to bed. Put them on again first thing in the morning. You should be given at least 2 stockings, or 2 pairs if you're wearing them on both legs. This means you can wear 1 stocking (or pair) while the other is being washed and dried.

## 2020-10-14 LAB — CMP14+EGFR
ALT: 17 IU/L (ref 0–32)
AST: 17 IU/L (ref 0–40)
Albumin/Globulin Ratio: 1.3 (ref 1.2–2.2)
Albumin: 4.7 g/dL (ref 3.8–4.8)
Alkaline Phosphatase: 56 IU/L (ref 44–121)
BUN/Creatinine Ratio: 16 (ref 12–28)
BUN: 13 mg/dL (ref 8–27)
Bilirubin Total: 0.4 mg/dL (ref 0.0–1.2)
CO2: 23 mmol/L (ref 20–29)
Calcium: 9.7 mg/dL (ref 8.7–10.3)
Chloride: 99 mmol/L (ref 96–106)
Creatinine, Ser: 0.83 mg/dL (ref 0.57–1.00)
Globulin, Total: 3.5 g/dL (ref 1.5–4.5)
Glucose: 104 mg/dL — ABNORMAL HIGH (ref 65–99)
Potassium: 4.2 mmol/L (ref 3.5–5.2)
Sodium: 139 mmol/L (ref 134–144)
Total Protein: 8.2 g/dL (ref 6.0–8.5)
eGFR: 78 mL/min/{1.73_m2} (ref >59)

## 2020-10-14 LAB — CBC WITH DIFFERENTIAL/PLATELET
Basophils Absolute: 0.1 10*3/uL (ref 0.0–0.2)
Basos: 1 %
EOS (ABSOLUTE): 0.3 10*3/uL (ref 0.0–0.4)
Eos: 6 %
Hematocrit: 42.3 % (ref 34.0–46.6)
Hemoglobin: 14.6 g/dL (ref 11.1–15.9)
Immature Grans (Abs): 0 10*3/uL (ref 0.0–0.1)
Immature Granulocytes: 0 %
Lymphocytes Absolute: 2.2 10*3/uL (ref 0.7–3.1)
Lymphs: 44 %
MCH: 31.5 pg (ref 26.6–33.0)
MCHC: 34.5 g/dL (ref 31.5–35.7)
MCV: 91 fL (ref 79–97)
Monocytes Absolute: 0.5 10*3/uL (ref 0.1–0.9)
Monocytes: 9 %
Neutrophils Absolute: 2 10*3/uL (ref 1.4–7.0)
Neutrophils: 40 %
Platelets: 224 10*3/uL (ref 150–450)
RBC: 4.63 x10E6/uL (ref 3.77–5.28)
RDW: 12.7 % (ref 11.7–15.4)
WBC: 4.9 10*3/uL (ref 3.4–10.8)

## 2020-10-14 LAB — TSH+FREE T4
Free T4: 0.83 ng/dL (ref 0.82–1.77)
TSH: 1.4 u[IU]/mL (ref 0.450–4.500)

## 2020-10-14 LAB — LIPID PANEL
Chol/HDL Ratio: 4.9 ratio — ABNORMAL HIGH (ref 0.0–4.4)
Cholesterol, Total: 258 mg/dL — ABNORMAL HIGH (ref 100–199)
HDL: 53 mg/dL (ref >39)
LDL Chol Calc (NIH): 179 mg/dL — ABNORMAL HIGH (ref 0–99)
Triglycerides: 143 mg/dL (ref 0–149)
VLDL Cholesterol Cal: 26 mg/dL (ref 5–40)

## 2020-10-14 LAB — MICROALBUMIN, URINE: Microalbumin, Urine: 18.7 ug/mL

## 2020-10-22 ENCOUNTER — Encounter (INDEPENDENT_AMBULATORY_CARE_PROVIDER_SITE_OTHER): Payer: Self-pay

## 2020-11-03 ENCOUNTER — Other Ambulatory Visit: Payer: Self-pay

## 2020-11-03 ENCOUNTER — Other Ambulatory Visit (HOSPITAL_COMMUNITY)
Admission: RE | Admit: 2020-11-03 | Discharge: 2020-11-03 | Disposition: A | Discharge status: Home / Self Care | Payer: Medicare Other | Source: Ambulatory Visit | Attending: Primary Care | Admitting: Primary Care

## 2020-11-03 ENCOUNTER — Encounter (INDEPENDENT_AMBULATORY_CARE_PROVIDER_SITE_OTHER): Payer: Self-pay | Admitting: Primary Care

## 2020-11-03 ENCOUNTER — Ambulatory Visit (INDEPENDENT_AMBULATORY_CARE_PROVIDER_SITE_OTHER): Payer: Self-pay | Admitting: Primary Care

## 2020-11-03 ENCOUNTER — Ambulatory Visit (INDEPENDENT_AMBULATORY_CARE_PROVIDER_SITE_OTHER): Payer: Medicaid Other | Admitting: Primary Care

## 2020-11-03 VITALS — BP 126/85 | HR 79 | Temp 97.5°F | Ht 66.5 in | Wt 191.4 lb

## 2020-11-03 DIAGNOSIS — Z124 Encounter for screening for malignant neoplasm of cervix: Secondary | ICD-10-CM | POA: Diagnosis not present

## 2020-11-03 DIAGNOSIS — Z01419 Encounter for gynecological examination (general) (routine) without abnormal findings: Secondary | ICD-10-CM | POA: Insufficient documentation

## 2020-11-03 DIAGNOSIS — Z1211 Encounter for screening for malignant neoplasm of colon: Secondary | ICD-10-CM

## 2020-11-03 DIAGNOSIS — Z1231 Encounter for screening mammogram for malignant neoplasm of breast: Secondary | ICD-10-CM

## 2020-11-03 DIAGNOSIS — Z1151 Encounter for screening for human papillomavirus (HPV): Secondary | ICD-10-CM | POA: Diagnosis not present

## 2020-11-03 NOTE — Patient Instructions (Signed)
Pap Test Why am I having this test? A Pap test, also called a Pap smear, is a screening test to check for signs of: Cancer of the vagina, cervix, and uterus. The cervix is the lower part of the uterus that opens into the vagina. Infection. Changes that may be a sign that cancer is developing (precancerous changes). Women need this test on a regular basis. In general, you should have a Pap test every 3 years until you reach menopause or age 65. Women aged 65-60 may choose to have their Pap test done at the same time as an HPV (human papillomavirus) test every 5 years (instead of every 3 years). Your health care provider may recommend having Pap tests more or less oftendepending on your medical conditions and past Pap test results. What kind of sample is taken?  Your health care provider will collect a sample of cells from the surface of your cervix. This will be done using a small cotton swab, plastic spatula, or brush. This sample is often collected during a pelvic exam, when you are lying on your back on an exam table with feet in footrests (stirrups). In some cases, fluids (secretions) from the cervix or vagina may also be collected. How do I prepare for this test? Be aware of where you are in your menstrual cycle. If you are menstruating on the day of the test, you may be asked to reschedule. You may need to reschedule if you have a known vaginal infection on the day of the test. Follow instructions from your health care provider about: Changing or stopping your regular medicines. Some medicines can cause abnormal test results, such as digitalis and tetracycline. Avoiding douching or taking a bath the day before or the day of the test. Tell a health care provider about: Any allergies you have. All medicines you are taking, including vitamins, herbs, eye drops, creams, and over-the-counter medicines. Any blood disorders you have. Any surgeries you have had. Any medical conditions you  have. Whether you are pregnant or may be pregnant. How are the results reported? Your test results will be reported as either abnormal or normal. A false-positive result can occur. A false positive is incorrect because itmeans that a condition is present when it is not. A false-negative result can occur. A false negative is incorrect because itmeans that a condition is not present when it is. What do the results mean? A normal test result means that you do not have signs of cancer of the vagina,cervix, or uterus. An abnormal result may mean that you have: Cancer. A Pap test by itself is not enough to diagnose cancer. You will have more tests done in this case. Precancerous changes in your vagina, cervix, or uterus. Inflammation of the cervix. An STD (sexually transmitted disease). A fungal infection. A parasite infection. Talk with your health care provider about what your results mean. Questions to ask your health care provider Ask your health care provider, or the department that is doing the test: When will my results be ready? How will I get my results? What are my treatment options? What other tests do I need? What are my next steps? Summary In general, women should have a Pap test every 3 years until they reach menopause or age 65. Your health care provider will collect a sample of cells from the surface of your cervix. This will be done using a small cotton swab, plastic spatula, or brush. In some cases, fluids (secretions) from the cervix or  vagina may also be collected. This information is not intended to replace advice given to you by your health care provider. Make sure you discuss any questions you have with your healthcare provider. Document Revised: 03/02/2020 Document Reviewed: 11/21/2019 Elsevier Patient Education  [place].

## 2020-11-03 NOTE — Progress Notes (Signed)
  [place] PAP Patient name: [name] MRN [id number]  Date of birth: 10/01/1955 Chief Complaint:   Gynecologic Exam  History of Present Illness:   [name] is a 65 y.o. No obstetric history on file. female being seen today for a routine well-woman exam.   CC:gyn   The current method of family planning is none.  No LMP recorded. Patient is postmenopausal. Last pap unknown. Results were:  Last mammogram: never. Family h/o breast cancer: No Last colonoscopy: 11/06/2018 [facility]. Results were: normal. Family h/o colorectal cancer: No  Review of Systems:    Denies any headaches, blurred vision, fatigue, shortness of breath, chest pain, abdominal pain, abnormal vaginal discharge/itching/odor/irritation, problems with periods, bowel movements, urination, or intercourse unless otherwise stated above.  Pertinent History Reviewed:   Reviewed past medical,surgical, social and family history.  Reviewed problem list, medications and allergies.  Physical Assessment:   Vitals:   11/03/20 1052  BP: 126/85  Pulse: 79  Temp: (!) 97.5 F (36.4 C)  TempSrc: Temporal  SpO2: 97%  Weight: 191 lb 6.4 oz (86.8 kg)  Height: 5' 6.5" (1.689 m)  Body mass index is 30.43 kg/m.        Physical Examination:  General appearance - well appearing, and in no distress Mental status - alert, oriented to person, place, and time Psych:  She has a normal mood and affect Skin - warm and dry, normal color, no suspicious lesions noted Chest - effort normal, all lung fields clear to auscultation bilaterally Heart - normal rate and regular rhythm Neck:  midline trachea, no thyromegaly or nodules Breasts - breasts appear normal, no suspicious masses, no skin or nipple changes or axillary nodes Educated patient on proper self breast examination and had patient to demonstrate SBE. Abdomen - soft, nontender, nondistended, no masses or organomegaly Pelvic-VULVA:  normal appearing vulva with no masses, tenderness or lesions   VAGINA: normal appearing vagina with normal color and discharge, no lesions   CERVIX: normal appearing cervix without discharge or lesions, no CMT UTERUS: uterus is felt to be normal size, shape, consistency and nontender  ADNEXA: No adnexal masses or tenderness noted. Extremities:  No swelling or varicosities noted  No results found for this or any previous visit (from the past 24 hour(s)).   Assessment & Plan:    [name] was seen today for gynecologic exam. ) Well-Woman Exam with Pap   Diagnoses and all orders for this visit:  Cervical cancer screening -     Cytology - PAP([place])  Encounter for screening mammogram for malignant neoplasm of breast -     MM Digital Screening; Future  Colon cancer screening Referral placed to GI       Meds: No orders of the defined types were placed in this encounter.   Follow-up: Return for keep schedule appt.  This note has been created with Surveyor, quantity. Any transcriptional errors are unintentional.   Kerin Perna, NP 11/08/2020, 6:45 PM

## 2020-11-05 ENCOUNTER — Other Ambulatory Visit: Payer: Self-pay

## 2020-11-07 LAB — CYTOLOGY - PAP
Chlamydia: NEGATIVE
Comment: NEGATIVE
Comment: NEGATIVE
Comment: NEGATIVE
Comment: NORMAL
Diagnosis: NEGATIVE
High risk HPV: NEGATIVE
Neisseria Gonorrhea: NEGATIVE
Trichomonas: NEGATIVE

## 2020-11-08 ENCOUNTER — Other Ambulatory Visit: Payer: Self-pay

## 2020-11-18 ENCOUNTER — Encounter (INDEPENDENT_AMBULATORY_CARE_PROVIDER_SITE_OTHER): Payer: Self-pay

## 2020-12-10 ENCOUNTER — Other Ambulatory Visit: Payer: Self-pay

## 2020-12-17 ENCOUNTER — Other Ambulatory Visit: Payer: Self-pay

## 2021-01-11 ENCOUNTER — Other Ambulatory Visit: Payer: Self-pay | Admitting: Primary Care

## 2021-01-11 DIAGNOSIS — Z1231 Encounter for screening mammogram for malignant neoplasm of breast: Secondary | ICD-10-CM

## 2021-01-21 ENCOUNTER — Ambulatory Visit (INDEPENDENT_AMBULATORY_CARE_PROVIDER_SITE_OTHER): Payer: Medicaid Other | Admitting: Primary Care

## 2021-02-09 ENCOUNTER — Encounter (INDEPENDENT_AMBULATORY_CARE_PROVIDER_SITE_OTHER): Payer: Self-pay | Admitting: Primary Care

## 2021-02-09 ENCOUNTER — Ambulatory Visit (INDEPENDENT_AMBULATORY_CARE_PROVIDER_SITE_OTHER): Payer: Medicare Other | Admitting: Primary Care

## 2021-02-09 ENCOUNTER — Other Ambulatory Visit: Payer: Self-pay

## 2021-02-09 ENCOUNTER — Ambulatory Visit: Payer: Medicare Other

## 2021-02-09 DIAGNOSIS — Z0001 Encounter for general adult medical examination with abnormal findings: Secondary | ICD-10-CM

## 2021-02-09 DIAGNOSIS — I10 Essential (primary) hypertension: Secondary | ICD-10-CM

## 2021-02-09 DIAGNOSIS — Z760 Encounter for issue of repeat prescription: Secondary | ICD-10-CM | POA: Diagnosis not present

## 2021-02-09 DIAGNOSIS — Z1211 Encounter for screening for malignant neoplasm of colon: Secondary | ICD-10-CM

## 2021-02-09 MED ORDER — HYDROCHLOROTHIAZIDE 25 MG PO TABS
ORAL_TABLET | Freq: Every day | ORAL | 1 refills | Status: DC
  Filled 2021-02-09: qty 90, 90d supply, fill #0

## 2021-02-09 MED ORDER — PRAVASTATIN SODIUM 40 MG PO TABS
40.0000 mg | ORAL_TABLET | Freq: Every day | ORAL | 0 refills | Status: DC
  Filled 2021-02-09: qty 90, 90d supply, fill #0

## 2021-02-09 MED ORDER — AMLODIPINE BESYLATE 10 MG PO TABS
ORAL_TABLET | Freq: Every day | ORAL | 1 refills | Status: DC
  Filled 2021-02-09: qty 90, 90d supply, fill #0
  Filled 2021-05-16: qty 90, 90d supply, fill #1
  Filled 2021-05-16: qty 90, 90d supply, fill #0

## 2021-02-09 NOTE — Progress Notes (Signed)
I connected with  [name] on 02/09/21 by a audio enabled telemedicine application and verified that I am speaking with the correct person using two identifiers.  Patient Location: Home  Provider Location: Other:  home  I discussed the limitations of evaluation and management by telemedicine. The patient expressed understanding and agreed to proceed.     [facility]   Subjective:  Genesis Novosad is a 65 y.o. woman who presents for Annual Wellness Visit and complete physical.  Date of last wellness visit is none.   Lab Results  Component Value Date   CHOL 258 (H) 10/13/2020   HDL 53 10/13/2020   LDLCALC 179 (H) 10/13/2020   TRIG 143 10/13/2020   CHOLHDL 4.9 (H) 10/13/2020   Lab Results  Component Value Date   HGBA1C 5.9 (H) 04/30/2020   Lab Results  Component Value Date   VD25OH 40.2 04/30/2020       Names of Other Physician/Practitioners you currently use: 1.. none, eye doctor, last visit none.  Will mail a list of providers for dentist and ophthalmologist and patient will call and schedule an appointment 2. no, dentist, last visit no Patient Care Team: Kerin Perna, NP as PCP - General (Internal Medicine)   Medication Review: Current Outpatient Medications on File Prior to Visit  Medication Sig Dispense Refill   acetaminophen (TYLENOL) 500 MG tablet Take 1,000 mg by mouth daily as needed for mild pain.     albuterol (PROAIR HFA) 108 (90 Base) MCG/ACT inhaler Inhale 2 puffs into the lungs every 6 (six) hours as needed for wheezing or shortness of breath. 8.5 g 1   ALOE VERA PO Take by mouth.     amLODipine (NORVASC) 10 MG tablet TAKE 1 TABLET (10 MG TOTAL) BY MOUTH DAILY. 90 tablet 1   Ascorbic Acid (VITAMIN C PO) Take by mouth.     aspirin EC 81 MG tablet Take 1 tablet (81 mg total) by mouth daily. Swallow whole. 30 tablet 11   Cyanocobalamin (VITAMIN B 12 PO) Take by mouth.     ECHINACEA PO Take by mouth.     fluticasone  (FLONASE) 50 MCG/ACT nasal spray PLACE 2 SPRAYS INTO BOTH NOSTRILS DAILY. 16 g 6   hydrochlorothiazide (HYDRODIURIL) 25 MG tablet TAKE 1 TABLET (25 MG TOTAL) BY MOUTH DAILY. 90 tablet 1   ibuprofen (ADVIL) 800 MG tablet TAKE 1 TABLET (800 MG TOTAL) BY MOUTH EVERY 8 (EIGHT) HOURS AS NEEDED. 90 tablet 1   loratadine (CLARITIN) 10 MG tablet Take 1 tablet (10 mg total) by mouth daily. 30 tablet 11   methocarbamol (ROBAXIN) 500 MG tablet TAKE 1 TABLET (500 MG TOTAL) BY MOUTH EVERY 8 (EIGHT) HOURS AS NEEDED FOR MUSCLE SPASMS. 90 tablet 1   pravastatin (PRAVACHOL) 40 MG tablet Take 1 tablet (40 mg total) by mouth daily. 90 tablet 1   TURMERIC PO Take by mouth.     VITAMIN D PO Take by mouth.     No current facility-administered medications on file prior to visit.    Current Problems (verified) Patient Active Problem List   Diagnosis Date Noted   Acute kidney injury ([place]) 07/02/2012   Unspecified essential hypertension 07/01/2012   Cocaine abuse ([place]) 06/07/2012   Acute URI 06/07/2012   Hypertensive urgency 06/06/2012    Screening Tests Health Maintenance  Topic Date Due   OPHTHALMOLOGY EXAM  Never done   MAMMOGRAM  Never done   Zoster Vaccines- Shingrix (1 of 2) Never done  COVID-19 Vaccine (3 - Booster for Pfizer series) 08/16/2019   Pneumonia Vaccine 9+ Years old (2 - PPSV23 if available, else PCV20) 10/10/2019   COLON CANCER SCREENING ANNUAL FOBT  11/07/2019   DEXA SCAN  Never done   HEMOGLOBIN A1C  10/28/2020   INFLUENZA VACCINE  Never done   FOOT EXAM  10/13/2021   URINE MICROALBUMIN  10/13/2021   PAP SMEAR-Modifier  11/04/2023   TETANUS/TDAP  10/09/2028   Hepatitis C Screening  Completed   HIV Screening  Completed   HPV VACCINES  Aged Out   COLONOSCOPY (Pts 45-25yrs Insurance coverage will need to be confirmed)  Discontinued    Immunization History  Administered Date(s) Administered   PFIZER(Purple Top)SARS-COV-2 Vaccination 05/31/2019, 06/21/2019   Pneumococcal  Conjugate-13 10/10/2018   Tdap 10/10/2018    History reviewed: allergies, current medications, past family history, past medical history, past social history, past surgical history and problem list No family history on file.  No past surgical history on file.  Past Medical History:  Diagnosis Date   Hx of blood clots 1993   previously on Coumadin x 3 months in the past    Hyperlipidemia    Hypertension      Risk Factors:   Osteoporosis/FallRisk: postmenopausal estrogen deficiency In the past year have you fallen or had a near fall?:No History of fracture in the past year: no  Tobacco Social History   Tobacco Use   Smoking status: Never   Smokeless tobacco: Never  Vaping Use   Vaping Use: Never used  Substance Use Topics   Alcohol use: Yes    Alcohol/week: 2.0 standard drinks    Types: 2 Cans of beer per week   Drug use: No   She does not smoke.  Patient is not a former smoker. Are there smokers in your home (other than you)?  Yes - I have recommended complete cessation of tobacco use. I have discussed various options available for assistance with tobacco cessation including over the counter methods (Nicotine gum, patch and lozenges). We also discussed prescription options (Chantix, Nicotine Inhaler / Nasal Spray). The patient is not interested in pursuing any prescription tobacco cessation options at this time. - Patient declines at this time.  - Less than 5 minutes spent on counseling.    Alcohol Current alcohol use: 3 beers on the weekend  Caffeine Current caffeine use: denies use  Exercise Current exercise: walking  Nutrition/Diet Current diet: in general, a "healthy" diet    Cardiac risk factors: advanced age (older than 28 for men, 34 for women), dyslipidemia, and hypertension.  Depression Screen (Note: if answer to either of the following is "Yes", a more complete depression screening is indicated)   Q1: Over the past two weeks, have you felt down,  depressed or hopeless? No  Q2: Over the past two weeks, have you felt little interest or pleasure in doing things? No  Q3: Have you lost interest or pleasure in daily life? No  Q4: Do you often feel hopeless? No  Q5: Do you cry easily over simple problems? No  Activities of Daily Living In your present state of health, do you have any difficulty performing the following activities?:  Driving? Yes Managing money?  Yes Feeding yourself? Yes Getting from bed to chair? Yes Climbing a flight of stairs? Yes Preparing food and eating?: Yes Bathing or showering? Yes Getting dressed: Yes Getting to the toilet? Yes Using the toilet:Yes Moving around from place to place: Yes In the past year  have you fallen or had a near fall?:No   Are you sexually active?  Yes  Do you have more than one partner?  No  Vision Difficulties: Yes  Hearing Difficulties: No Do you often ask people to speak up or repeat themselves? Yes Do you experience ringing or noises in your ears? Yes Do you have difficulty understanding soft or whispered voices? Yes  Cognition  Do you feel that you have a problem with memory?No  Do you often misplace items? No  Do you feel safe at home?  Yes  Advanced directives Does patient have a [place]? No Does patient have a Living Will? No   Objective:     There were no vitals taken for this visit. There is no height or weight on file to calculate BMI.  General appearance: alert, no distress, WD/WN, female  Cognitive Testing:   Alert? Yes   Normal Appearance? Yes  Oriented to person? Yes   Place? Yes   Time? Yes  Recall of three objects? Yes  Can perform simple calculations? Yes  Displays appropriate judgment? Yes  Can read the correct time from a watch face? Yes  HEENT: normocephalic, sclerae anicteric, TMs pearly, nares patent, no discharge or erythema, pharynx normal Oral cavity: MMM, no lesions Neck: supple, no lymphadenopathy, no  thyromegaly, no masses Heart: RRR, normal S1, S2, no murmurs Lungs: CTA bilaterally, no wheezes, rhonchi, or rales Abdomen: +bs, soft, non tender, non distended, no masses, no hepatomegaly, no splenomegaly Musculoskeletal: nontender, no swelling, no obvious deformity Extremities: no edema, no cyanosis, no clubbing Pulses: 2+ symmetric, upper and lower extremities, normal cap refill Neurological: alert, oriented x 3, strength normal upper extremities and lower extremities, sensation normal throughout, DTRs 2+ throughout, no cerebellar signs, gait normal Vaginal:   Psychiatric: normal affect, behavior normal, pleasant   Assessment:   Patient denies any difficulties at home. No trouble with ADLs, depression or falls. No recent changes to vision or hearing. Is UTD with immunizations. Is UTD with screening. Discussed Advanced Directives, patient agrees to bring [place] copies of documents if can. Encouraged heart healthy diet, exercise as tolerated and adequate sleep. Declines flu shot. Pap smear done today.     Plan:   During the course of the visit the patient was educated and counseled about appropriate screening and preventive services including:   Pneumococcal vaccine  Influenza vaccine Td vaccine Screening electrocardiogram Bone densitometry screening Colorectal cancer screening Diabetes screening Glaucoma screening Nutrition counseling  Advanced directives  Screening recommendations, referrals:  Vaccinations: Please see documentation below and orders this visit. Nutrition assessed and recommended  Colonoscopy declined Recommended yearly ophthalmology/optometry visit for glaucoma screening and checkup Recommended yearly dental visit for hygiene and checkup Advanced directives - requested  Conditions/risks identified:  BMI: Discussed weight loss, diet, and increase physical activity.  Increase physical activity: AHA recommends 150 minutes of physical activity a week.   Medications reviewed RA- on DMARD-no Urinary Incontinence is not an issue: discussed non pharmacology and pharmacology options.  Fall risk: low- discussed PT, home fall assessment, medications.    Medicare Attestation  I have personally reviewed: The patient's medical and social history Their use of alcohol, tobacco or illicit drugs Their current medications and supplements The patient's functional ability including ADLs,fall risks, home safety risks, cognitive, and hearing and visual impairment Diet and physical activities Evidence for depression or mood disorders  The patient's weight, height, BMI, and visual acuity have been recorded in the chart.  I have  made referrals, counseling, and provided education to the patient based on review of the above and I have provided the patient with a written personalized care plan for preventive services.     This note has been created with Surveyor, quantity. Any transcriptional errors are unintentional.   Kerin Perna, NP 02/09/2021, 3:12 PM

## 2021-02-11 ENCOUNTER — Other Ambulatory Visit: Payer: Self-pay

## 2021-03-02 DIAGNOSIS — R195 Other fecal abnormalities: Secondary | ICD-10-CM

## 2021-03-02 HISTORY — DX: Other fecal abnormalities: R19.5

## 2021-03-08 LAB — COLOGUARD: COLOGUARD: POSITIVE — AB

## 2021-03-09 ENCOUNTER — Other Ambulatory Visit (INDEPENDENT_AMBULATORY_CARE_PROVIDER_SITE_OTHER): Payer: Self-pay | Admitting: Primary Care

## 2021-03-09 ENCOUNTER — Telehealth (INDEPENDENT_AMBULATORY_CARE_PROVIDER_SITE_OTHER): Payer: Self-pay

## 2021-03-09 DIAGNOSIS — K921 Melena: Secondary | ICD-10-CM

## 2021-03-09 NOTE — Telephone Encounter (Signed)
-----   Message from Kerin Perna, NP sent at 03/09/2021  9:10 AM EST ----- COLOGUARD is Positive this is a screening for colon cancer. This is abnormal will refer you to GI for further evaluation

## 2021-03-09 NOTE — Telephone Encounter (Signed)
Patient is aware of positive cologuard and referral being sent to GI for colonoscopy. She is aware that GI office will contact her to schedule. She verbalized understanding of results. Nat Christen, CMA

## 2021-03-10 ENCOUNTER — Ambulatory Visit
Admission: RE | Admit: 2021-03-10 | Discharge: 2021-03-10 | Disposition: A | Discharge status: Home / Self Care | Payer: Medicare Other | Source: Ambulatory Visit | Attending: Primary Care | Admitting: Primary Care

## 2021-03-10 ENCOUNTER — Other Ambulatory Visit: Payer: Self-pay

## 2021-03-10 DIAGNOSIS — Z1231 Encounter for screening mammogram for malignant neoplasm of breast: Secondary | ICD-10-CM

## 2021-03-11 ENCOUNTER — Telehealth (INDEPENDENT_AMBULATORY_CARE_PROVIDER_SITE_OTHER): Payer: Self-pay

## 2021-03-11 NOTE — Telephone Encounter (Signed)
Copied from [place] [id number]. Topic: General - Other >> Mar 11, 2021  9:30 AM Leward Quan A wrote: Reason for CRM: Patient called in to inform Ms Oletta Lamas that she want to re do the Cologuard test because she was constipated when she collected the sample for that first test so that is why blood showed in her stool. Would like a call back please at Ph# Ph# [phone] or [phone]   Called patient and left message asking to return call to [facility] at [phone]. Nat Christen, CMA

## 2021-04-11 ENCOUNTER — Encounter: Payer: Self-pay | Admitting: Internal Medicine

## 2021-05-10 ENCOUNTER — Other Ambulatory Visit: Payer: Self-pay

## 2021-05-10 ENCOUNTER — Ambulatory Visit (AMBULATORY_SURGERY_CENTER): Payer: Medicare Other | Admitting: *Deleted

## 2021-05-10 VITALS — Ht 66.5 in | Wt 185.0 lb

## 2021-05-10 DIAGNOSIS — Z1211 Encounter for screening for malignant neoplasm of colon: Secondary | ICD-10-CM

## 2021-05-10 MED ORDER — NA SULFATE-K SULFATE-MG SULF 17.5-3.13-1.6 GM/177ML PO SOLN
1.0000 | Freq: Once | ORAL | 0 refills | Status: AC
  Filled 2021-05-10: qty 354, 1d supply, fill #0

## 2021-05-10 NOTE — Progress Notes (Signed)
+   colo guard 02-2021 No egg or soy allergy known to patient  No issues known to pt with past sedation with any surgeries or procedures Patient denies ever being  intubated  No FH of Malignant Hyperthermia Pt is not on diet pills Pt is not on  home 02  Pt is not on blood thinners  Pt denies issues with constipation  No A fib or A flutter  Pt is fully vaccinated  for Covid    NO PA's for preps discussed with pt In PV today  Discussed with pt there will be an out-of-pocket cost for prep and that varies from $0 to 70 +  dollars - pt verbalized understanding   Due to the COVID-19 pandemic we are asking patients to follow certain guidelines in PV and the [facility]   Pt aware of COVID protocols and [facility] guidelines   PV completed over the phone. Pt verified name, DOB, address and insurance during PV today.  Pt mailed instruction packet with copy of consent form to read and not return, and instructions.  Pt encouraged to call with questions or issues.  If pt has My chart, procedure instructions sent via My Chart

## 2021-05-11 ENCOUNTER — Other Ambulatory Visit: Payer: Self-pay

## 2021-05-12 ENCOUNTER — Other Ambulatory Visit: Payer: Self-pay

## 2021-05-16 ENCOUNTER — Other Ambulatory Visit (INDEPENDENT_AMBULATORY_CARE_PROVIDER_SITE_OTHER): Payer: Self-pay | Admitting: Primary Care

## 2021-05-16 ENCOUNTER — Ambulatory Visit (INDEPENDENT_AMBULATORY_CARE_PROVIDER_SITE_OTHER): Payer: Medicare Other | Admitting: Primary Care

## 2021-05-16 ENCOUNTER — Other Ambulatory Visit: Payer: Self-pay

## 2021-05-16 DIAGNOSIS — I10 Essential (primary) hypertension: Secondary | ICD-10-CM

## 2021-05-16 DIAGNOSIS — Z760 Encounter for issue of repeat prescription: Secondary | ICD-10-CM

## 2021-05-17 ENCOUNTER — Other Ambulatory Visit: Payer: Self-pay

## 2021-05-17 MED ORDER — PRAVASTATIN SODIUM 40 MG PO TABS
40.0000 mg | ORAL_TABLET | Freq: Every day | ORAL | 0 refills | Status: DC
  Filled 2021-05-17: qty 90, 90d supply, fill #0

## 2021-05-17 NOTE — Telephone Encounter (Signed)
Requested Prescriptions  Pending Prescriptions Disp Refills   pravastatin (PRAVACHOL) 40 MG tablet 90 tablet 0    Sig: Take 1 tablet (40 mg total) by mouth daily.     Cardiovascular:  Antilipid - Statins Failed - 05/17/2021  9:54 AM      Failed - Lipid Panel in normal range within the last 12 months    Cholesterol, Total  Date Value Ref Range Status  10/13/2020 258 (H) 100 - 199 mg/dL Final   LDL Chol Calc (NIH)  Date Value Ref Range Status  10/13/2020 179 (H) 0 - 99 mg/dL Final   HDL  Date Value Ref Range Status  10/13/2020 53 >39 mg/dL Final   Triglycerides  Date Value Ref Range Status  10/13/2020 143 0 - 149 mg/dL Final         Passed - Patient is not pregnant      Passed - Valid encounter within last 12 months    Recent Outpatient Visits          3 months ago Colon cancer screening   [place], Michelle P, NP   6 months ago Cervical cancer screening   [place], Michelle P, NP   7 months ago Essential hypertension   [place], Michelle P, NP   9 months ago Bilateral low back pain, unspecified chronicity, unspecified whether sciatica present   [place], Turner, NP   1 year ago Chronic maxillary sinusitis   [place] Kerin Perna, NP

## 2021-05-24 ENCOUNTER — Ambulatory Visit (AMBULATORY_SURGERY_CENTER): Payer: Medicare Other | Admitting: Internal Medicine

## 2021-05-24 ENCOUNTER — Encounter: Payer: Self-pay | Admitting: Internal Medicine

## 2021-05-24 ENCOUNTER — Other Ambulatory Visit: Payer: Self-pay

## 2021-05-24 VITALS — BP 130/93 | HR 75 | Temp 97.5°F | Resp 13 | Ht 66.5 in | Wt 185.0 lb

## 2021-05-24 DIAGNOSIS — K635 Polyp of colon: Secondary | ICD-10-CM | POA: Diagnosis not present

## 2021-05-24 DIAGNOSIS — Z1211 Encounter for screening for malignant neoplasm of colon: Secondary | ICD-10-CM

## 2021-05-24 DIAGNOSIS — D120 Benign neoplasm of cecum: Secondary | ICD-10-CM | POA: Diagnosis not present

## 2021-05-24 DIAGNOSIS — D122 Benign neoplasm of ascending colon: Secondary | ICD-10-CM

## 2021-05-24 MED ORDER — SODIUM CHLORIDE 0.9 % IV SOLN: 500.0000 mL | Freq: Once | INTRAVENOUS | Status: DC

## 2021-05-24 NOTE — Progress Notes (Signed)
GASTROENTEROLOGY PROCEDURE H&P NOTE   Primary Care Physician: Kerin Perna, NP    Reason for Procedure:   Colon cancer screening  Plan:    Colonoscopy  Patient is appropriate for endoscopic procedure(s) in the ambulatory ([place]) setting.  The nature of the procedure, as well as the risks, benefits, and alternatives were carefully and thoroughly reviewed with the patient. Ample time for discussion and questions allowed. The patient understood, was satisfied, and agreed to proceed.     HPI: [name] is a 66 y.o. female who presents for colonoscopy for colon cancer screening. Denies blood in stools, changes in bowel habits, weight loss. Denies fam hx of colon cancer.  Past Medical History:  Diagnosis Date   Allergy    allergy , sinus issues   Anemia    when younger   Hx of blood clots 04/04/1991   previously on Coumadin x 3 months in the past    Hyperlipidemia    on meds   Hypertension    on meds   Positive colorectal cancer screening using Cologuard test 03/02/2021    Past Surgical History:  Procedure Laterality Date   tooth pulled with sedation     many years ago    Prior to Admission medications   Medication Sig Start Date End Date Taking? Authorizing Provider  amLODipine (NORVASC) 10 MG tablet TAKE 1 TABLET (10 MG TOTAL) BY MOUTH DAILY. 02/09/21 02/09/22 Yes Kerin Perna, NP  cetirizine (ZYRTEC) 10 MG tablet Take 10 mg by mouth daily. Prn allergies   Yes [provider]  co-enzyme Q-10 30 MG capsule Take 30 mg by mouth 3 (three) times daily.   Yes [provider]  pravastatin (PRAVACHOL) 40 MG tablet Take 1 tablet (40 mg total) by mouth daily. 05/17/21  Yes Kerin Perna, NP  acetaminophen (TYLENOL) 500 MG tablet Take 1,000 mg by mouth daily as needed for mild pain.    [provider]  albuterol (PROAIR HFA) 108 (90 Base) MCG/ACT inhaler Inhale 2 puffs into the lungs every 6 (six) hours as needed for wheezing or  shortness of breath. Patient not taking: Reported on 05/24/2021 10/13/20 10/13/21  Kerin Perna, NP  ALOE VERA PO Take by mouth.    [provider]  Ascorbic Acid (VITAMIN C PO) Take by mouth.    [provider]  Cyanocobalamin (VITAMIN B 12 PO) Take by mouth.    [provider]  ECHINACEA PO Take by mouth.    [provider]  fluticasone (FLONASE) 50 MCG/ACT nasal spray PLACE 2 SPRAYS INTO BOTH NOSTRILS DAILY. 10/13/20 10/13/21  Kerin Perna, NP  hydrochlorothiazide (HYDRODIURIL) 25 MG tablet TAKE 1 TABLET (25 MG TOTAL) BY MOUTH DAILY. 02/09/21 02/09/22  Kerin Perna, NP  ibuprofen (ADVIL) 800 MG tablet TAKE 1 TABLET (800 MG TOTAL) BY MOUTH EVERY 8 (EIGHT) HOURS AS NEEDED. 08/03/20 08/03/21  Kerin Perna, NP  loratadine (CLARITIN) 10 MG tablet Take 1 tablet (10 mg total) by mouth daily. 10/13/20   Kerin Perna, NP  TURMERIC PO Take by mouth.    [provider]  VITAMIN D PO Take by mouth.    [provider]  VITAMIN E PO Take by mouth.    [provider]    Current Outpatient Medications  Medication Sig Dispense Refill   amLODipine (NORVASC) 10 MG tablet TAKE 1 TABLET (10 MG TOTAL) BY MOUTH DAILY. 90 tablet 1   cetirizine (ZYRTEC) 10 MG tablet  Take 10 mg by mouth daily. Prn allergies     co-enzyme Q-10 30 MG capsule Take 30 mg by mouth 3 (three) times daily.     pravastatin (PRAVACHOL) 40 MG tablet Take 1 tablet (40 mg total) by mouth daily. 90 tablet 0   acetaminophen (TYLENOL) 500 MG tablet Take 1,000 mg by mouth daily as needed for mild pain.     albuterol (PROAIR HFA) 108 (90 Base) MCG/ACT inhaler Inhale 2 puffs into the lungs every 6 (six) hours as needed for wheezing or shortness of breath. (Patient not taking: Reported on 05/24/2021) 8.5 g 1   ALOE VERA PO Take by mouth.     Ascorbic Acid (VITAMIN C PO) Take by mouth.     Cyanocobalamin (VITAMIN B 12 PO) Take by mouth.     ECHINACEA PO Take by  mouth.     fluticasone (FLONASE) 50 MCG/ACT nasal spray PLACE 2 SPRAYS INTO BOTH NOSTRILS DAILY. 16 g 6   hydrochlorothiazide (HYDRODIURIL) 25 MG tablet TAKE 1 TABLET (25 MG TOTAL) BY MOUTH DAILY. 90 tablet 1   ibuprofen (ADVIL) 800 MG tablet TAKE 1 TABLET (800 MG TOTAL) BY MOUTH EVERY 8 (EIGHT) HOURS AS NEEDED. 90 tablet 1   loratadine (CLARITIN) 10 MG tablet Take 1 tablet (10 mg total) by mouth daily. 30 tablet 11   TURMERIC PO Take by mouth.     VITAMIN D PO Take by mouth.     VITAMIN E PO Take by mouth.     Current Facility-Administered Medications  Medication Dose Route Frequency Provider Last Rate Last Admin   0.9 %  sodium chloride infusion  500 mL Intravenous Once Sharyn Creamer, MD        Allergies as of 05/24/2021 - Review Complete 05/24/2021  Allergen Reaction Noted   Lisinopril  07/02/2012    Family History  Problem Relation Age of Onset   Breast cancer Neg Hx    Colon cancer Neg Hx    Colon polyps Neg Hx    Esophageal cancer Neg Hx    Rectal cancer Neg Hx    Stomach cancer Neg Hx     Social History   Socioeconomic History   Marital status: Single    Spouse name: Not on file   Number of children: Not on file   Years of education: Not on file   Highest education level: Not on file  Occupational History   Not on file  Tobacco Use   Smoking status: Never   Smokeless tobacco: Never  Vaping Use   Vaping Use: Never used  Substance and Sexual Activity   Alcohol use: Yes    Alcohol/week: 2.0 standard drinks    Types: 2 Cans of beer per week   Drug use: No   Sexual activity: Not Currently  Other Topics Concern   Not on file  Social History Narrative   Not on file   Social Determinants of Health   Financial Resource Strain: Not on file  Food Insecurity: Not on file  Transportation Needs: Not on file  Physical Activity: Not on file  Stress: Not on file  Social Connections: Not on file  Intimate Partner Violence: Not on file    Physical Exam: Vital  signs in last 24 hours: BP (!) 152/107    Pulse 84    Temp (!) 97.5 F (36.4 C)    Resp 16    Ht 5' 6.5" (1.689 m)    Wt 185 lb (83.9 kg)  SpO2 91%    BMI 29.41 kg/m  GEN: NAD EYE: Sclerae anicteric ENT: MMM CV: Non-tachycardic Pulm: No increased work of breathing GI: Soft, NT/ND NEURO:  Alert & Oriented   [name], MD [facility]  05/24/2021 8:17 AM

## 2021-05-24 NOTE — Op Note (Signed)
[facility] Patient Name: [name] Procedure Date: 05/24/2021 8:17 AM MRN: [id number] Endoscopist: Sonny Masters "[name] ,  Age: 66 Referring MD:  Date of Birth: February 24, 1956 Gender: Female Account #: [id number] Procedure:                Colonoscopy Indications:              Screening for colorectal malignant neoplasm, This                            is the patient's first colonoscopy Medicines:                Monitored Anesthesia Care Procedure:                After obtaining informed consent, the colonoscope                            was passed under direct vision. Throughout the                            procedure, the patient's blood pressure, pulse, and                            oxygen saturations were monitored continuously. The                            CF [id number] #[id number] was introduced through the anus                            and advanced to the the terminal ileum. The                            colonoscopy was performed without difficulty. The                            patient tolerated the procedure well. The quality                            of the bowel preparation was good. The terminal                            ileum, ileocecal valve, appendiceal orifice, and                            rectum were photographed. Scope In: 8:19:57 AM Scope Out: 9:13:11 AM Scope Withdrawal Time: 0 hours 47 minutes 16 seconds  Total Procedure Duration: 0 hours 53 minutes 14 seconds  Findings:                 The terminal ileum appeared normal.                           A 5 mm polyp was found in the cecum. The polyp was                            sessile. The polyp was removed  with a cold snare.                            Resection and retrieval were complete.                           A 20 mm polyp was found in the ascending colon. The                            polyp was sessile. Preparations were made for                            mucosal resection. Saline was  injected to raise the                            lesion. Snare mucosal resection was performed                            piecemeal. Snare tip cautery was applied to the                            edges of the polypectomy site. Resection and                            retrieval were complete. Area was tattooed with an                            injection of Spot (carbon black).                           Multiple small and large-mouthed diverticula were                            found in the sigmoid colon, descending colon,                            transverse colon and ascending colon.                           Non-bleeding internal hemorrhoids were found during                            retroflexion. Complications:            No immediate complications. Estimated Blood Loss:     Estimated blood loss was minimal. Impression:               - The examined portion of the ileum was normal.                           - One 5 mm polyp in the cecum, removed with a cold                            snare. Resected and retrieved.                           -  One 20 mm polyp in the ascending colon, removed                            with mucosal resection. Resected and retrieved.                            Tattooed.                           - Diverticulosis in the sigmoid colon, in the                            descending colon, in the transverse colon and in                            the ascending colon.                           - Non-bleeding internal hemorrhoids.                           - Mucosal resection was performed. Resection and                            retrieval were complete. Recommendation:           - Discharge patient to home (with escort).                           - Await pathology results.                           - Repeat colonoscopy in 6 months to review the                            polypectomy site.                           - The findings and recommendations were  discussed                            with the patient. Sonny Masters "[name]" [place],  05/24/2021 9:27:02 AM

## 2021-05-24 NOTE — Progress Notes (Signed)
AG - VS  Pt's states no medical or surgical changes since previsit or office visit.

## 2021-05-24 NOTE — Patient Instructions (Addendum)
Repeat colonoscopy in 6 months   Handouts on polyps & diverticulosis & hemorrhoids given to you today  Await pathology results on polyps removed      YOU HAD AN ENDOSCOPIC PROCEDURE TODAY AT [facility]:   Refer to the procedure report that was given to you for any specific questions about what was found during the examination.  If the procedure report does not answer your questions, please call your gastroenterologist to clarify.  If you requested that your care partner not be given the details of your procedure findings, then the procedure report has been included in a sealed envelope for you to review at your convenience later.  YOU SHOULD EXPECT: Some feelings of bloating in the abdomen. Passage of more gas than usual.  Walking can help get rid of the air that was put into your GI tract during the procedure and reduce the bloating. If you had a lower endoscopy (such as a colonoscopy or flexible sigmoidoscopy) you may notice spotting of blood in your stool or on the toilet paper. If you underwent a bowel prep for your procedure, you may not have a normal bowel movement for a few days.  Please Note:  You might notice some irritation and congestion in your nose or some drainage.  This is from the oxygen used during your procedure.  There is no need for concern and it should clear up in a day or so.  SYMPTOMS TO REPORT IMMEDIATELY:  Following lower endoscopy (colonoscopy or flexible sigmoidoscopy):  Excessive amounts of blood in the stool  Significant tenderness or worsening of abdominal pains  Swelling of the abdomen that is new, acute  Fever of 100F or higher   For urgent or emergent issues, a gastroenterologist can be reached at any hour by calling [phone]. Do not use MyChart messaging for urgent concerns.    DIET:  We do recommend a small meal at first, but then you may proceed to your regular diet.  Drink plenty of fluids but you should avoid alcoholic  beverages for 24 hours.  ACTIVITY:  You should plan to take it easy for the rest of today and you should NOT DRIVE or use heavy machinery until tomorrow (because of the sedation medicines used during the test).    FOLLOW UP: Our staff will call the number listed on your records 48-72 hours following your procedure to check on you and address any questions or concerns that you may have regarding the information given to you following your procedure. If we do not reach you, we will leave a message.  We will attempt to reach you two times.  During this call, we will ask if you have developed any symptoms of COVID 19. If you develop any symptoms (ie: fever, flu-like symptoms, shortness of breath, cough etc.) before then, please call [phone].  If you test positive for Covid 19 in the 2 weeks post procedure, please call and report this information to [place].    If any biopsies were taken you will be contacted by phone or by letter within the next 1-3 weeks.  Please call us at [phone] if you have not heard about the biopsies in 3 weeks.    SIGNATURES/CONFIDENTIALITY: You and/or your care partner have signed paperwork which will be entered into your electronic medical record.  These signatures attest to the fact that that the information above on your After Visit Summary has been reviewed and is understood.  Full responsibility of the  confidentiality of this discharge information lies with you and/or your care-partner.

## 2021-05-24 NOTE — Progress Notes (Signed)
Called to room to assist during endoscopic procedure.  Patient ID and intended procedure confirmed with present staff. Received instructions for my participation in the procedure from the performing physician.  

## 2021-05-24 NOTE — Progress Notes (Signed)
Repeat colonoscopy ordered for 6 months - recall entered due to schedule not out that far

## 2021-05-24 NOTE — Progress Notes (Signed)
Report to PACU, RN, vss, BBS= Clear.  

## 2021-05-26 ENCOUNTER — Telehealth: Payer: Self-pay | Admitting: *Deleted

## 2021-05-26 NOTE — Telephone Encounter (Signed)
Patient returned your call, stated she is doing well. °

## 2021-05-26 NOTE — Telephone Encounter (Signed)
°  Follow up Call-  Call back number 05/24/2021  Post procedure Call Back phone  # [phone]  Permission to leave phone message Yes  Some recent data might be hidden     Patient questions: Phone just kept ringing.

## 2021-05-26 NOTE — Telephone Encounter (Signed)
°  Follow up Call-  Call back number 05/24/2021  Post procedure Call Back phone  # [phone]  Permission to leave phone message Yes  Some recent data might be hidden     Patient questions: Phone kept ringing.

## 2021-05-27 ENCOUNTER — Encounter: Payer: Self-pay | Admitting: Internal Medicine

## 2021-07-11 ENCOUNTER — Ambulatory Visit (INDEPENDENT_AMBULATORY_CARE_PROVIDER_SITE_OTHER): Payer: Medicare Other | Admitting: Primary Care

## 2021-08-17 ENCOUNTER — Other Ambulatory Visit (INDEPENDENT_AMBULATORY_CARE_PROVIDER_SITE_OTHER): Payer: Self-pay | Admitting: Primary Care

## 2021-08-17 ENCOUNTER — Other Ambulatory Visit: Payer: Self-pay

## 2021-08-17 DIAGNOSIS — I10 Essential (primary) hypertension: Secondary | ICD-10-CM

## 2021-08-17 DIAGNOSIS — Z760 Encounter for issue of repeat prescription: Secondary | ICD-10-CM

## 2021-08-17 MED ORDER — AMLODIPINE BESYLATE 10 MG PO TABS
ORAL_TABLET | Freq: Every day | ORAL | 0 refills | Status: DC
  Filled 2021-08-17: qty 30, 30d supply, fill #0

## 2021-08-17 NOTE — Telephone Encounter (Signed)
Sent to PCP ?

## 2021-08-18 ENCOUNTER — Other Ambulatory Visit: Payer: Self-pay

## 2021-08-19 ENCOUNTER — Other Ambulatory Visit (INDEPENDENT_AMBULATORY_CARE_PROVIDER_SITE_OTHER): Payer: Self-pay | Admitting: Primary Care

## 2021-08-19 ENCOUNTER — Other Ambulatory Visit: Payer: Self-pay

## 2021-08-19 DIAGNOSIS — I10 Essential (primary) hypertension: Secondary | ICD-10-CM

## 2021-08-19 DIAGNOSIS — Z760 Encounter for issue of repeat prescription: Secondary | ICD-10-CM

## 2021-08-19 MED ORDER — PRAVASTATIN SODIUM 40 MG PO TABS
40.0000 mg | ORAL_TABLET | Freq: Every day | ORAL | 0 refills | Status: DC
  Filled 2021-08-19 – 2021-09-16 (×2): qty 30, 30d supply, fill #0

## 2021-08-19 NOTE — Telephone Encounter (Signed)
Courtesy #[phone] given- patient has scheduled appointment 09/05/21 Requested Prescriptions  Pending Prescriptions Disp Refills  . pravastatin (PRAVACHOL) 40 MG tablet 30 tablet 0    Sig: Take 1 tablet (40 mg total) by mouth daily.     Cardiovascular:  Antilipid - Statins Failed - 08/19/2021 12:07 PM      Failed - Lipid Panel in normal range within the last 12 months    Cholesterol, Total  Date Value Ref Range Status  10/13/2020 258 (H) 100 - 199 mg/dL Final   LDL Chol Calc (NIH)  Date Value Ref Range Status  10/13/2020 179 (H) 0 - 99 mg/dL Final   HDL  Date Value Ref Range Status  10/13/2020 53 >39 mg/dL Final   Triglycerides  Date Value Ref Range Status  10/13/2020 143 0 - 149 mg/dL Final         Passed - Patient is not pregnant      Passed - Valid encounter within last 12 months    Recent Outpatient Visits          6 months ago Colon cancer screening   [place], Michelle P, NP   9 months ago Cervical cancer screening   [place], Michelle P, NP   10 months ago Essential hypertension   [place], Michelle P, NP   1 year ago Bilateral low back pain, unspecified chronicity, unspecified whether sciatica present   [place], [place], NP   1 year ago Chronic maxillary sinusitis   [place], [place], NP      Future Appointments            In 2 weeks Oletta Lamas, Milford Cage, NP [place]

## 2021-08-25 ENCOUNTER — Other Ambulatory Visit: Payer: Self-pay

## 2021-09-05 ENCOUNTER — Ambulatory Visit (INDEPENDENT_AMBULATORY_CARE_PROVIDER_SITE_OTHER): Payer: Medicare Other | Admitting: Primary Care

## 2021-09-16 ENCOUNTER — Other Ambulatory Visit: Payer: Self-pay

## 2021-09-16 ENCOUNTER — Other Ambulatory Visit (INDEPENDENT_AMBULATORY_CARE_PROVIDER_SITE_OTHER): Payer: Self-pay | Admitting: Primary Care

## 2021-09-16 DIAGNOSIS — I10 Essential (primary) hypertension: Secondary | ICD-10-CM

## 2021-09-16 DIAGNOSIS — Z760 Encounter for issue of repeat prescription: Secondary | ICD-10-CM

## 2021-09-16 MED ORDER — AMLODIPINE BESYLATE 10 MG PO TABS
ORAL_TABLET | Freq: Every day | ORAL | 0 refills | Status: DC
  Filled 2021-09-16: qty 30, 30d supply, fill #0

## 2021-09-19 ENCOUNTER — Ambulatory Visit (INDEPENDENT_AMBULATORY_CARE_PROVIDER_SITE_OTHER): Payer: Medicare Other | Admitting: Primary Care

## 2021-09-20 ENCOUNTER — Other Ambulatory Visit: Payer: Self-pay

## 2021-09-21 ENCOUNTER — Other Ambulatory Visit: Payer: Self-pay

## 2021-09-21 ENCOUNTER — Other Ambulatory Visit (HOSPITAL_COMMUNITY): Payer: Self-pay

## 2021-09-22 ENCOUNTER — Other Ambulatory Visit: Payer: Self-pay

## 2021-09-23 ENCOUNTER — Other Ambulatory Visit: Payer: Self-pay

## 2021-09-29 ENCOUNTER — Other Ambulatory Visit: Payer: Self-pay

## 2021-09-29 ENCOUNTER — Ambulatory Visit (INDEPENDENT_AMBULATORY_CARE_PROVIDER_SITE_OTHER): Payer: Medicare Other | Admitting: Primary Care

## 2021-09-29 ENCOUNTER — Encounter (INDEPENDENT_AMBULATORY_CARE_PROVIDER_SITE_OTHER): Payer: Self-pay | Admitting: Primary Care

## 2021-09-29 ENCOUNTER — Other Ambulatory Visit: Payer: Self-pay | Admitting: Pharmacist

## 2021-09-29 VITALS — BP 128/83 | HR 75 | Temp 98.0°F | Ht 66.5 in | Wt 190.0 lb

## 2021-09-29 DIAGNOSIS — E782 Mixed hyperlipidemia: Secondary | ICD-10-CM

## 2021-09-29 DIAGNOSIS — I10 Essential (primary) hypertension: Secondary | ICD-10-CM | POA: Diagnosis not present

## 2021-09-29 DIAGNOSIS — Z760 Encounter for issue of repeat prescription: Secondary | ICD-10-CM

## 2021-09-29 DIAGNOSIS — J302 Other seasonal allergic rhinitis: Secondary | ICD-10-CM

## 2021-09-29 DIAGNOSIS — K0500 Acute gingivitis, plaque induced: Secondary | ICD-10-CM | POA: Diagnosis not present

## 2021-09-29 DIAGNOSIS — Z86010 Personal history of colonic polyps: Secondary | ICD-10-CM | POA: Diagnosis not present

## 2021-09-29 MED ORDER — LORATADINE 10 MG PO TABS
10.0000 mg | ORAL_TABLET | Freq: Every day | ORAL | 11 refills | Status: DC
  Filled 2021-09-29: qty 30, 30d supply, fill #0

## 2021-09-29 MED ORDER — FLUTICASONE PROPIONATE 50 MCG/ACT NA SUSP
2.0000 | Freq: Every day | NASAL | 2 refills | Status: DC
  Filled 2021-09-29: qty 16, 30d supply, fill #0

## 2021-09-29 MED ORDER — CHLORHEXIDINE GLUCONATE SOLN
1 refills | Status: DC
  Filled 2021-09-29: qty 4000, fill #0

## 2021-09-29 MED ORDER — CHLORHEXIDINE GLUCONATE 0.12 % MT SOLN
15.0000 mL | Freq: Two times a day (BID) | OROMUCOSAL | 0 refills | Status: DC
  Filled 2021-09-29: qty 473, 16d supply, fill #0

## 2021-09-29 NOTE — Progress Notes (Signed)
[name] is a 66 y.o. female presents for hypertension evaluation, Denies shortness of breath, headaches, chest pain or lower extremity edema, sudden onset, vision changes, unilateral weakness, dizziness, paresthesias .  She voices concerns about her amlodipine was told by her dentist that her gum infection is medication was the cause.  Researched and found prior for dental care could lead to overgrowth of gingivitis and cause gum inflammation.  Will discontinue amlodipine.  Patient reports adherence with medications.  Dietary habits include: monitoring  Exercise habits include:walking Family / Social history: No   Past Medical History:  Diagnosis Date   Allergy    allergy , sinus issues   Anemia    when younger   Hx of blood clots 04/04/1991   previously on Coumadin x 3 months in the past    Hyperlipidemia    on meds   Hypertension    on meds   Positive colorectal cancer screening using Cologuard test 03/02/2021   Past Surgical History:  Procedure Laterality Date   tooth pulled with sedation     many years ago   Allergies  Allergen Reactions   Lisinopril     Suspect ACE inhibitor allergy as patient creatinine nearly doubled after initiation of lisinopril   Current Outpatient Medications on File Prior to Visit  Medication Sig Dispense Refill   acetaminophen (TYLENOL) 500 MG tablet Take 1,000 mg by mouth daily as needed for mild pain.     ALOE VERA PO Take by mouth.     Ascorbic Acid (VITAMIN C PO) Take by mouth.     co-enzyme Q-10 30 MG capsule Take 30 mg by mouth 3 (three) times daily.     Cyanocobalamin (VITAMIN B 12 PO) Take by mouth.     ECHINACEA PO Take by mouth.     hydrochlorothiazide (HYDRODIURIL) 25 MG tablet TAKE 1 TABLET (25 MG TOTAL) BY MOUTH DAILY. 90 tablet 1   pravastatin (PRAVACHOL) 40 MG tablet Take 1 tablet (40 mg total) by mouth once daily. 30 tablet 0   TURMERIC PO Take by mouth.     VITAMIN D PO Take by  mouth.     VITAMIN E PO Take by mouth.     albuterol (PROAIR HFA) 108 (90 Base) MCG/ACT inhaler Inhale 2 puffs into the lungs every 6 (six) hours as needed for wheezing or shortness of breath. (Patient not taking: Reported on 05/24/2021) 8.5 g 1   No current facility-administered medications on file prior to visit.   Social History   Socioeconomic History   Marital status: Single    Spouse name: Not on file   Number of children: Not on file   Years of education: Not on file   Highest education level: Not on file  Occupational History   Not on file  Tobacco Use   Smoking status: Never   Smokeless tobacco: Never  Vaping Use   Vaping Use: Never used  Substance and Sexual Activity   Alcohol use: Yes    Alcohol/week: 2.0 standard drinks of alcohol    Types: 2 Cans of beer per week   Drug use: No   Sexual activity: Not Currently  Other Topics Concern   Not on file  Social History Narrative   Not on file   Social Determinants of Health   Financial Resource Strain: Not on file  Food Insecurity: Not on file  Transportation Needs: Not on file  Physical Activity: Not on file  Stress: Not  on file  Social Connections: Not on file  Intimate Partner Violence: Not on file   Family History  Problem Relation Age of Onset   Breast cancer Neg Hx    Colon cancer Neg Hx    Colon polyps Neg Hx    Esophageal cancer Neg Hx    Rectal cancer Neg Hx    Stomach cancer Neg Hx      OBJECTIVE:  Vitals:   09/29/21 1517  BP: 128/83  Pulse: 75  Temp: 98 F (36.7 C)  TempSrc: Oral  SpO2: 96%  Weight: 190 lb (86.2 kg)  Height: 5' 6.5" (1.689 m)    Physical Exam Vitals reviewed.  Constitutional:      Appearance: She is obese.  HENT:     Head: Normocephalic.     Right Ear: Tympanic membrane and external ear normal.     Left Ear: Tympanic membrane and external ear normal.     Nose: Nose normal.  Eyes:     Extraocular Movements: Extraocular movements intact.     Pupils: Pupils are  equal, round, and reactive to light.  Cardiovascular:     Rate and Rhythm: Normal rate and regular rhythm.  Pulmonary:     Effort: Pulmonary effort is normal.     Breath sounds: Normal breath sounds.  Abdominal:     General: Abdomen is flat. There is distension.     Palpations: Abdomen is soft.  Musculoskeletal:        General: Normal range of motion.     Cervical back: Normal range of motion.  Skin:    General: Skin is warm and dry.  Neurological:     Mental Status: She is alert and oriented to person, place, and time.  Psychiatric:        Mood and Affect: Mood normal.        Behavior: Behavior normal.        Thought Content: Thought content normal.        Judgment: Judgment normal.     ROS Comprehensive ROS Pertinent positive and negative noted in HPI   Last 3 Office BP readings: BP Readings from Last 3 Encounters:  09/29/21 128/83  05/24/21 (!) 130/93  11/03/20 126/85    BMET    Component Value Date/Time   NA 139 10/13/2020 1040   K 4.2 10/13/2020 1040   CL 99 10/13/2020 1040   CO2 23 10/13/2020 1040   GLUCOSE 104 (H) 10/13/2020 1040   GLUCOSE 115 (H) 03/06/2018 1224   BUN 13 10/13/2020 1040   CREATININE 0.83 10/13/2020 1040   CREATININE 1.15 (H) 07/01/2012 1558   CALCIUM 9.7 10/13/2020 1040   GFRNONAA 89 04/30/2020 0859   GFRAA 102 04/30/2020 0859    Renal function: CrCl cannot be calculated (Patient's most recent lab result is older than the maximum 21 days allowed.).  Clinical ASCVD: Yes  The 10-year ASCVD risk score (Arnett DK, et al., 2019) is: 26.8%   Values used to calculate the score:     Age: 29 years     Sex: Female     Is Non-Hispanic African American: Yes     Diabetic: Yes     Tobacco smoker: No     Systolic Blood Pressure: 027 mmHg     Is BP treated: Yes     HDL Cholesterol: 53 mg/dL     Total Cholesterol: 258 mg/dL  ASCVD risk factors include- [place]   ASSESSMENT & PLAN: [name] was seen today for hypertension and edema.  Diagnoses  and all orders for this visit:  History of colonic polyps -     Ambulatory referral to Gastroenterology  Acute gingivitis -     CHLORHEXIDINE GLUCONATE, BULK, SOLN; Use a cap full and gargle after brushing teeth twice daily -     CBC with Differential/Platelet; Future   Mixed hyperlipidemia -     Lipid panel; Future  Seasonal allergies -     loratadine (CLARITIN) 10 MG tablet; Take 1 tablet (10 mg total) by mouth daily. -     fluticasone (FLONASE) 50 MCG/ACT nasal spray; PLACE 2 SPRAYS INTO BOTH NOSTRILS DAILY.  Medication refill -     loratadine (CLARITIN) 10 MG tablet; Take 1 tablet (10 mg total) by mouth daily. -     fluticasone (FLONASE) 50 MCG/ACT nasal spray; PLACE 2 SPRAYS INTO BOTH NOSTRILS DAILY.    Essential hypertension Blood pressure control including reduced dietary sodium, increased exercise, weight reduction and adequate sleep. Also, educated patient about the risk for cardiovascular events, stroke and heart attack. Also counseled patient about the importance of medication adherence. If you participate in smoking, it is important to stop using tobacco as this will increase the risks associated with uncontrolled blood pressure.       CMP14+EGFR; Future  Minimize salt intake. Minimize alcohol intake    This note has been created with Surveyor, quantity. Any transcriptional errors are unintentional.   Kerin Perna, NP 09/29/2021, 3:56 PM

## 2021-09-30 ENCOUNTER — Encounter: Payer: Self-pay | Admitting: Internal Medicine

## 2021-09-30 ENCOUNTER — Other Ambulatory Visit: Payer: Self-pay

## 2021-09-30 ENCOUNTER — Telehealth (INDEPENDENT_AMBULATORY_CARE_PROVIDER_SITE_OTHER): Payer: Self-pay | Admitting: Primary Care

## 2021-09-30 NOTE — Telephone Encounter (Signed)
Routed to PCP 

## 2021-09-30 NOTE — Telephone Encounter (Signed)
Pt called to report that she is missing Blood pressure medication, her PCP discontinued her amlodipine during yesterdays visit but without a prescription called in today she will be without medication all weekend, please advise  [facility] [place]. [employer], [place] [address]  Phone: [phone] Fax: [phone]

## 2021-10-02 ENCOUNTER — Other Ambulatory Visit: Payer: Self-pay | Admitting: Nurse Practitioner

## 2021-10-02 DIAGNOSIS — I10 Essential (primary) hypertension: Secondary | ICD-10-CM

## 2021-10-02 DIAGNOSIS — Z760 Encounter for issue of repeat prescription: Secondary | ICD-10-CM

## 2021-10-02 MED ORDER — HYDROCHLOROTHIAZIDE 25 MG PO TABS
ORAL_TABLET | Freq: Every day | ORAL | 1 refills | Status: DC
  Filled 2021-10-02: qty 90, 90d supply, fill #0
  Filled 2022-01-09: qty 90, 90d supply, fill #1

## 2021-10-02 NOTE — Telephone Encounter (Signed)
HCTZ prescription which she should still be taking has been refilled.

## 2021-10-03 ENCOUNTER — Other Ambulatory Visit: Payer: Self-pay

## 2021-10-06 ENCOUNTER — Ambulatory Visit (HOSPITAL_COMMUNITY)
Admission: EM | Admit: 2021-10-06 | Discharge: 2021-10-06 | Disposition: A | Discharge status: Home / Self Care | Payer: Medicare Other | Attending: Student | Admitting: Student

## 2021-10-06 ENCOUNTER — Encounter (HOSPITAL_COMMUNITY): Payer: Self-pay | Admitting: Emergency Medicine

## 2021-10-06 ENCOUNTER — Ambulatory Visit (INDEPENDENT_AMBULATORY_CARE_PROVIDER_SITE_OTHER): Payer: Self-pay | Admitting: *Deleted

## 2021-10-06 ENCOUNTER — Other Ambulatory Visit: Payer: Self-pay

## 2021-10-06 DIAGNOSIS — H109 Unspecified conjunctivitis: Secondary | ICD-10-CM | POA: Diagnosis not present

## 2021-10-06 MED ORDER — POLYMYXIN B-TRIMETHOPRIM 10000-0.1 UNIT/ML-% OP SOLN
1.0000 [drp] | OPHTHALMIC | 0 refills | Status: DC
  Filled 2021-10-06: qty 10, 34d supply, fill #0

## 2021-10-06 NOTE — ED Provider Notes (Signed)
[facility]    CSN: [id number] Arrival date & time: 10/06/21  1042      History   Chief Complaint Chief Complaint  Patient presents with   Conjunctivitis    HPI [name] is a 66 y.o. female  presenting with concern for pinkeye following exposure to this at home.  Describes 2 days of conjunctival injection with crusting in the morning.  States symptoms are mild, but she needs a work note.  She wears glasses not contacts.  She is feeling well otherwise, without cough or congestion.  Denies photophobia, foreign body sensation, eye pain, eye pain with movement, injury to eye, vision changes, double vision, excessive tearing, burning eyes  HPI  Past Medical History:  Diagnosis Date   Allergy    allergy , sinus issues   Anemia    when younger   Hx of blood clots 04/04/1991   previously on Coumadin x 3 months in the past    Hyperlipidemia    on meds   Hypertension    on meds   Positive colorectal cancer screening using Cologuard test 03/02/2021    Patient Active Problem List   Diagnosis Date Noted   Acute kidney injury ([place]) 07/02/2012   Unspecified essential hypertension 07/01/2012   Cocaine abuse ([place]) 06/07/2012   Acute URI 06/07/2012   Hypertensive urgency 06/06/2012    Past Surgical History:  Procedure Laterality Date   tooth pulled with sedation     many years ago    OB History   No obstetric history on file.      Home Medications    Prior to Admission medications   Medication Sig Start Date End Date Taking? Authorizing Provider  trimethoprim-polymyxin b (POLYTRIM) ophthalmic solution Place 1 drop into both eyes every 4 (four) hours for 7 days. 1 drop into both eyes every 4 hours while awake x7 days. 10/06/21 11/09/21 Yes Hazel Sams, PA-C  acetaminophen (TYLENOL) 500 MG tablet Take 1,000 mg by mouth daily as needed for mild pain.    [provider]  ALOE VERA PO Take by mouth.    [provider]  Ascorbic Acid  (VITAMIN C PO) Take by mouth.    [provider]  chlorhexidine (PERIDEX) 0.12 % solution Use as directed 15 mLs in the mouth or throat 2 (two) times daily. 09/29/21   Charlott Rakes, MD  CHLORHEXIDINE GLUCONATE, BULK, SOLN Use a cap full and gargle after brushing teeth twice daily 09/29/21   Kerin Perna, NP  co-enzyme Q-10 30 MG capsule Take 30 mg by mouth 3 (three) times daily.    [provider]  Cyanocobalamin (VITAMIN B 12 PO) Take by mouth.    [provider]  ECHINACEA PO Take by mouth.    [provider]  fluticasone (FLONASE) 50 MCG/ACT nasal spray PLACE 2 SPRAYS INTO BOTH NOSTRILS DAILY. 09/29/21 09/29/22  Kerin Perna, NP  hydrochlorothiazide (HYDRODIURIL) 25 MG tablet TAKE 1 TABLET (25 MG TOTAL) BY MOUTH DAILY. 10/02/21 10/02/22  Gildardo Pounds, NP  loratadine (CLARITIN) 10 MG tablet Take 1 tablet (10 mg total) by mouth daily. 09/29/21   Kerin Perna, NP  pravastatin (PRAVACHOL) 40 MG tablet Take 1 tablet (40 mg total) by mouth once daily. 08/19/21   Kerin Perna, NP  TURMERIC PO Take by mouth.    [provider]  VITAMIN D PO Take by mouth.    [provider]  VITAMIN E PO Take by mouth.  [provider]    Family History Family History  Problem Relation Age of Onset   Breast cancer Neg Hx    Colon cancer Neg Hx    Colon polyps Neg Hx    Esophageal cancer Neg Hx    Rectal cancer Neg Hx    Stomach cancer Neg Hx     Social History Social History   Tobacco Use   Smoking status: Never   Smokeless tobacco: Never  Vaping Use   Vaping Use: Never used  Substance Use Topics   Alcohol use: Yes    Alcohol/week: 2.0 standard drinks of alcohol    Types: 2 Cans of beer per week   Drug use: No     Allergies   Lisinopril   Review of Systems Review of Systems  Eyes:  Positive for discharge and itching.  All other systems reviewed and are negative.    Physical Exam Triage Vital  Signs ED Triage Vitals [10/06/21 1121]  Enc Vitals Group     BP 128/89     Pulse Rate 74     Resp 18     Temp 98.3 F (36.8 C)     Temp src      SpO2 95 %     Weight      Height      Head Circumference      Peak Flow      Pain Score 0     Pain Loc      Pain Edu?      Excl. in [place]?    No data found.  Updated Vital Signs BP 128/89   Pulse 74   Temp 98.3 F (36.8 C)   Resp 18   SpO2 95%   Visual Acuity Right Eye Distance:   Left Eye Distance:   Bilateral Distance:    Right Eye Near:   Left Eye Near:    Bilateral Near:     Physical Exam Vitals reviewed.  Constitutional:      Appearance: Normal appearance.  HENT:     Head: Normocephalic and atraumatic.     Right Ear: Tympanic membrane, ear canal and external ear normal. There is no impacted cerumen.     Left Ear: Tympanic membrane, ear canal and external ear normal. There is no impacted cerumen.     Nose: Nose normal. No congestion.     Mouth/Throat:     Pharynx: Oropharynx is clear. No posterior oropharyngeal erythema.  Eyes:     General: Lids are normal. Lids are everted, no foreign bodies appreciated. Vision grossly intact. Gaze aligned appropriately. No visual field deficit.       Right eye: No foreign body, discharge or hordeolum.        Left eye: No foreign body, discharge or hordeolum.     Extraocular Movements: Extraocular movements intact.     Right eye: Normal extraocular motion and no nystagmus.     Left eye: Normal extraocular motion and no nystagmus.     Conjunctiva/sclera:     Right eye: Right conjunctiva is injected. No chemosis, exudate or hemorrhage.    Left eye: Left conjunctiva is injected. No chemosis, exudate or hemorrhage.    Pupils: Pupils are equal, round, and reactive to light.     Visual Fields: Right eye visual fields normal and left eye visual fields normal.     Comments: Bilateral conjunctival injection, without hemorrhage or lid changes. PERRLA, EOMI without pain. No orbital  tenderness. Visual acuity grossly intact.  Cardiovascular:     Rate and Rhythm: Normal rate and regular rhythm.     Heart sounds: Normal heart sounds.  Pulmonary:     Effort: Pulmonary effort is normal.     Breath sounds: Normal breath sounds.  Neurological:     General: No focal deficit present.     Mental Status: She is alert.  Psychiatric:        Mood and Affect: Mood normal.        Behavior: Behavior normal.        Thought Content: Thought content normal.        Judgment: Judgment normal.      UC Treatments / Results  Labs (all labs ordered are listed, but only abnormal results are displayed) Labs Reviewed - No data to display  EKG   Radiology No results found.  Procedures Procedures (including critical care time)  Medications Ordered in UC Medications - No data to display  Initial Impression / Assessment and Plan / UC Course  I have reviewed the triage vital signs and the nursing notes.  Pertinent labs & imaging results that were available during my care of the patient were reviewed by me and considered in my medical decision making (see chart for details).     This patient is a very pleasant 66 y.o. year old female presenting with pinkeye following exposure to this at home. Afebrile, nontachy. Visual acuity grossly intact - wears glasses. Polytrim sent. Work note provided. ED return precautions discussed. Patient verbalizes understanding and agreement.   Final Clinical Impressions(s) / UC Diagnoses   Final diagnoses:  Bacterial conjunctivitis     Discharge Instructions      -Use the Polytrim drops for your conjunctivitis.  Put 1 drop in the affected eye every 4 hours while awake for 7 days.   -You can also use warm compresses 1-2 times daily. -If your symptoms get worse instead of better, follow-up immediately with ophthalmologist or other eye doctor.  This includes vision changes, eyelid swelling, pain with movement, worsening of redness despite  treatment.    ED Prescriptions     Medication Sig Dispense Auth. Provider   trimethoprim-polymyxin b (POLYTRIM) ophthalmic solution Place 1 drop into both eyes every 4 (four) hours for 7 days. 1 drop into both eyes every 4 hours while awake x7 days. 10 mL Hazel Sams, PA-C      PDMP not reviewed this encounter.   Hazel Sams, PA-C 10/06/21 1154

## 2021-10-06 NOTE — ED Triage Notes (Signed)
Pt is present today with bilateral eye drainage and irritation. Pt sx started Sunday

## 2021-10-06 NOTE — Discharge Instructions (Addendum)
-  Use the Polytrim drops for your conjunctivitis.  Put 1 drop in the affected eye every 4 hours while awake for 7 days.   -You can also use warm compresses 1-2 times daily. -If your symptoms get worse instead of better, follow-up immediately with ophthalmologist or other eye doctor.  This includes vision changes, eyelid swelling, pain with movement, worsening of redness despite treatment.

## 2021-10-06 NOTE — Telephone Encounter (Signed)
  Chief Complaint: right eye redness and itching now in left eye  Symptoms: sclera of both eyes now red. Itching, no swelling. Granddaughter recent dx of pink eye, little crust in corner of eyes in the am. Sent home from work Frequency: 10/02/21 Pertinent Negatives: Patient denies fever, no decrease in vision Disposition: '[]'$ ED /'[x]'$ Urgent Care (no appt availability in office) / '[]'$ Appointment(In office/virtual)/ '[]'$  [facility]/ '[]'$ Home Care/ '[]'$ Refused Recommended Disposition /'[]'$ [facility]/ '[]'$  Follow-up with PCP Additional Notes:   No available appt. Patient reports she will go to UC .   Reason for Disposition  Red eye present more than 7 days    Not more than 7 days  Answer Assessment - Initial Assessment Questions 1. LOCATION: Location: "What's red, the eyeball or the outer eyelids?" (Note: when callers say the eye is red, they usually mean the sclera is red)       Right eyeball red, now redness noted in left eye  2. REDNESS OF SCLERA: "Is the redness in one or both eyes?" "When did the redness start?"      Yes started 10/02/21 3. ONSET: "When did the eye become red?" (e.g., hours, days)      10/02/21 4. EYELIDS: "Are the eyelids red or swollen?" If Yes, ask: "How much?"      no 5. VISION: "Is there any difficulty seeing clearly?"      Wears glasses no difficulty seeing  6. ITCHING: "Does it feel itchy?" If so ask: "How bad is it" (e.g., Scale 1-10; or mild, moderate, severe)     Moderate and burns with scratching  7. PAIN: "Is there any pain? If Yes, ask: "How bad is it?" (e.g., Scale 1-10; or mild, moderate, severe)     Mild  8. CONTACT LENS: "Do you wear contacts?"     na 9. CAUSE: "What do you think is causing the redness?"     Pink eye. Was around a grandchild that was treated for pink eye but thought was recovering  10. OTHER SYMPTOMS: "Do you have any other symptoms?" (e.g., fever, runny nose, cough, vomiting)       No  Protocols used: Eye - Red Without  Pus-A-AH

## 2021-10-18 ENCOUNTER — Ambulatory Visit (HOSPITAL_COMMUNITY)
Admission: EM | Admit: 2021-10-18 | Discharge: 2021-10-18 | Disposition: A | Discharge status: Home / Self Care | Payer: Medicare Other | Attending: Family Medicine | Admitting: Family Medicine

## 2021-10-18 ENCOUNTER — Other Ambulatory Visit: Payer: Self-pay

## 2021-10-18 ENCOUNTER — Telehealth (INDEPENDENT_AMBULATORY_CARE_PROVIDER_SITE_OTHER): Payer: Self-pay | Admitting: Primary Care

## 2021-10-18 ENCOUNTER — Encounter (HOSPITAL_COMMUNITY): Payer: Self-pay | Admitting: Emergency Medicine

## 2021-10-18 DIAGNOSIS — Z760 Encounter for issue of repeat prescription: Secondary | ICD-10-CM

## 2021-10-18 DIAGNOSIS — H1031 Unspecified acute conjunctivitis, right eye: Secondary | ICD-10-CM | POA: Diagnosis not present

## 2021-10-18 DIAGNOSIS — I10 Essential (primary) hypertension: Secondary | ICD-10-CM

## 2021-10-18 MED ORDER — CEPHALEXIN 250 MG PO CAPS
250.0000 mg | ORAL_CAPSULE | Freq: Three times a day (TID) | ORAL | 0 refills | Status: AC
  Filled 2021-10-18: qty 15, 5d supply, fill #0

## 2021-10-18 MED ORDER — GENTAMICIN SULFATE 0.3 % OP SOLN
2.0000 [drp] | Freq: Three times a day (TID) | OPHTHALMIC | 0 refills | Status: AC
  Filled 2021-10-18: qty 5, 13d supply, fill #0

## 2021-10-18 NOTE — Discharge Instructions (Signed)
Stop using the trimethoprim eyedrops  Start using gentamicin eyedrops in the right eye 3 times daily for 5 days  Take cephalexin 250 mg--1 capsule 3 times daily for 5 days.

## 2021-10-18 NOTE — ED Provider Notes (Signed)
[place]    CSN: [id number] Arrival date & time: 10/18/21  1309      History   Chief Complaint Chief Complaint  Patient presents with   Eye Pain    HPI [name] is a 66 y.o. female.    Eye Pain   Here with continued right eye irritation and pain.  She was seen here in July 6 with bilateral eye irritation and redness.  The left one has already mostly improved with the sulfa drops but her right eye continues to have some irritation and pain.  She does have dried discharge on the eyelids when she gets up in the morning.  No fever or chills or cough.  She does have some pain up into her right brow.  Past Medical History:  Diagnosis Date   Allergy    allergy , sinus issues   Anemia    when younger   Hx of blood clots 04/04/1991   previously on Coumadin x 3 months in the past    Hyperlipidemia    on meds   Hypertension    on meds   Positive colorectal cancer screening using Cologuard test 03/02/2021    Patient Active Problem List   Diagnosis Date Noted   Acute kidney injury ([place]) 07/02/2012   Unspecified essential hypertension 07/01/2012   Cocaine abuse ([place]) 06/07/2012   Acute URI 06/07/2012   Hypertensive urgency 06/06/2012    Past Surgical History:  Procedure Laterality Date   tooth pulled with sedation     many years ago    OB History   No obstetric history on file.      Home Medications    Prior to Admission medications   Medication Sig Start Date End Date Taking? Authorizing Provider  cephALEXin (KEFLEX) 250 MG capsule Take 1 capsule (250 mg total) by mouth 3 (three) times daily for 5 days. 10/18/21 10/23/21 Yes Banks Chaikin, Gwenlyn Perking, MD  gentamicin (GARAMYCIN) 0.3 % ophthalmic solution Place 2 drops into the right eye 3 (three) times daily for 5 days. 10/18/21 10/23/21 Yes Onnie Alatorre, Gwenlyn Perking, MD  acetaminophen (TYLENOL) 500 MG tablet Take 1,000 mg by mouth daily as needed for mild pain.    [provider]  ALOE VERA PO Take  by mouth.    [provider]  Ascorbic Acid (VITAMIN C PO) Take by mouth.    [provider]  chlorhexidine (PERIDEX) 0.12 % solution Use as directed 15 mLs in the mouth or throat 2 (two) times daily. 09/29/21   Charlott Rakes, MD  CHLORHEXIDINE GLUCONATE, BULK, SOLN Use a cap full and gargle after brushing teeth twice daily 09/29/21   Kerin Perna, NP  co-enzyme Q-10 30 MG capsule Take 30 mg by mouth 3 (three) times daily.    [provider]  Cyanocobalamin (VITAMIN B 12 PO) Take by mouth.    [provider]  ECHINACEA PO Take by mouth.    [provider]  fluticasone (FLONASE) 50 MCG/ACT nasal spray PLACE 2 SPRAYS INTO BOTH NOSTRILS DAILY. 09/29/21 09/29/22  Kerin Perna, NP  hydrochlorothiazide (HYDRODIURIL) 25 MG tablet TAKE 1 TABLET (25 MG TOTAL) BY MOUTH DAILY. 10/02/21 10/02/22  Gildardo Pounds, NP  loratadine (CLARITIN) 10 MG tablet Take 1 tablet (10 mg total) by mouth daily. 09/29/21   Kerin Perna, NP  pravastatin (PRAVACHOL) 40 MG tablet Take 1 tablet (40 mg total) by mouth once daily. 08/19/21   Kerin Perna, NP  TURMERIC PO  Take by mouth.    [provider]  VITAMIN D PO Take by mouth.    [provider]  VITAMIN E PO Take by mouth.    [provider]    Family History Family History  Problem Relation Age of Onset   Breast cancer Neg Hx    Colon cancer Neg Hx    Colon polyps Neg Hx    Esophageal cancer Neg Hx    Rectal cancer Neg Hx    Stomach cancer Neg Hx     Social History Social History   Tobacco Use   Smoking status: Never   Smokeless tobacco: Never  Vaping Use   Vaping Use: Never used  Substance Use Topics   Alcohol use: Yes    Alcohol/week: 2.0 standard drinks of alcohol    Types: 2 Cans of beer per week   Drug use: No     Allergies   Lisinopril   Review of Systems Review of Systems  Eyes:  Positive for pain.     Physical Exam Triage Vital Signs ED  Triage Vitals  Enc Vitals Group     BP 10/18/21 1337 130/83     Pulse Rate 10/18/21 1337 73     Resp 10/18/21 1337 17     Temp 10/18/21 1337 98.1 F (36.7 C)     Temp Source 10/18/21 1337 Oral     SpO2 10/18/21 1337 97 %     Weight --      Height --      Head Circumference --      Peak Flow --      Pain Score 10/18/21 1336 8     Pain Loc --      Pain Edu? --      Excl. in [place]? --    No data found.  Updated Vital Signs BP 130/83 (BP Location: Right Arm)   Pulse 73   Temp 98.1 F (36.7 C) (Oral)   Resp 17   SpO2 97%   Visual Acuity Right Eye Distance:   Left Eye Distance:   Bilateral Distance:    Right Eye Near:   Left Eye Near:    Bilateral Near:     Physical Exam Vitals reviewed.  Constitutional:      General: She is not in acute distress.    Appearance: She is not ill-appearing, toxic-appearing or diaphoretic.  HENT:     Mouth/Throat:     Mouth: Mucous membranes are moist.  Eyes:     Extraocular Movements: Extraocular movements intact.     Pupils: Pupils are equal, round, and reactive to light.     Comments: There is injection of the right arm mostly on the medial part.  There is a 1 or 2 mm area that is a slightly raised yellowish bump on the medial conjunctiva.  The lids are not really swollen, but she is tender up onto her right upper eyelid and brow.  No erythema there  Cardiovascular:     Rate and Rhythm: Normal rate and regular rhythm.     Heart sounds: No murmur heard. Pulmonary:     Effort: Pulmonary effort is normal.     Breath sounds: Normal breath sounds. No stridor. No wheezing, rhonchi or rales.  Neurological:     Mental Status: She is alert and oriented to person, place, and time.  Psychiatric:        Behavior: Behavior normal.      UC Treatments / Results  Labs (  all labs ordered are listed, but only abnormal results are displayed) Labs Reviewed - No data to display  EKG   Radiology No results found.  Procedures Procedures  (including critical care time)  Medications Ordered in UC Medications - No data to display  Initial Impression / Assessment and Plan / UC Course  I have reviewed the triage vital signs and the nursing notes.  Pertinent labs & imaging results that were available during my care of the patient were reviewed by me and considered in my medical decision making (see chart for details).     Possible viral conjunctivitis.  I am going to change her to gent eyedrops, however, and do a brief oral antibiotics since her brow is so tender.  I have asked her to get in with her usual optometrist or I have also given her contact information for ophthalmology Final Clinical Impressions(s) / UC Diagnoses   Final diagnoses:  Acute conjunctivitis of right eye, unspecified acute conjunctivitis type     Discharge Instructions       Stop using the trimethoprim eyedrops  Start using gentamicin eyedrops in the right eye 3 times daily for 5 days  Take cephalexin 250 mg--1 capsule 3 times daily for 5 days.     ED Prescriptions     Medication Sig Dispense Auth. Provider   gentamicin (GARAMYCIN) 0.3 % ophthalmic solution Place 2 drops into the right eye 3 (three) times daily for 5 days. 5 mL Barrett Henle, MD   cephALEXin (KEFLEX) 250 MG capsule Take 1 capsule (250 mg total) by mouth 3 (three) times daily for 5 days. 15 capsule Windy Carina Gwenlyn Perking, MD      PDMP not reviewed this encounter.   Barrett Henle, MD 10/18/21 [id number]

## 2021-10-18 NOTE — ED Triage Notes (Addendum)
Pt reports right eye pain, redness, and blurred vision for 2 weeks. Reports was seen here on 7/6 and drops not helping. Still also having watering/drainage from right eye. Pt had to leave work today due to eye

## 2021-10-19 NOTE — Telephone Encounter (Signed)
Requested medication (s) are due for refill today: yes  Requested medication (s) are on the active medication list: yes  Last refill:  08/19/21 #30  Future visit scheduled: yes  Notes to clinic:  > 1 yr overdue lab work   Requested Prescriptions  Pending Prescriptions Disp Refills   pravastatin (PRAVACHOL) 40 MG tablet 30 tablet 0    Sig: Take 1 tablet (40 mg total) by mouth once daily.     Cardiovascular:  Antilipid - Statins Failed - 10/18/2021 12:54 PM      Failed - Lipid Panel in normal range within the last 12 months    Cholesterol, Total  Date Value Ref Range Status  10/13/2020 258 (H) 100 - 199 mg/dL Final   LDL Chol Calc (NIH)  Date Value Ref Range Status  10/13/2020 179 (H) 0 - 99 mg/dL Final   HDL  Date Value Ref Range Status  10/13/2020 53 >39 mg/dL Final   Triglycerides  Date Value Ref Range Status  10/13/2020 143 0 - 149 mg/dL Final         Passed - Patient is not pregnant      Passed - Valid encounter within last 12 months    Recent Outpatient Visits           2 weeks ago History of colonic polyps   [place], Michelle P, NP   8 months ago Colon cancer screening   [place], Michelle P, NP   11 months ago Cervical cancer screening   [place], Michelle P, NP   1 year ago Essential hypertension   [place] Kerin Perna, NP   1 year ago Bilateral low back pain, unspecified chronicity, unspecified whether sciatica present   [place], [place], NP       Future Appointments             In 1 month Oletta Lamas, Milford Cage, NP [place]

## 2021-10-21 ENCOUNTER — Other Ambulatory Visit: Payer: Self-pay

## 2021-10-25 NOTE — Telephone Encounter (Signed)
Attempted to reach patient. She has a voicemail that has not been setup. In order for her to get refills she needs to come in and have labs collected.

## 2021-10-25 NOTE — Telephone Encounter (Signed)
Patient aware of need for labs. She will come in Friday to have them collected.

## 2021-10-25 NOTE — Telephone Encounter (Signed)
Pt called and is requesting a call back to discuss what to do while she waits for her appt next month. Has been out of her medication for a week  Best contact: [phone]

## 2021-10-28 ENCOUNTER — Other Ambulatory Visit (INDEPENDENT_AMBULATORY_CARE_PROVIDER_SITE_OTHER): Payer: Medicare Other

## 2021-10-28 DIAGNOSIS — K0500 Acute gingivitis, plaque induced: Secondary | ICD-10-CM

## 2021-10-28 DIAGNOSIS — I10 Essential (primary) hypertension: Secondary | ICD-10-CM

## 2021-10-28 DIAGNOSIS — E782 Mixed hyperlipidemia: Secondary | ICD-10-CM

## 2021-10-31 ENCOUNTER — Other Ambulatory Visit: Payer: Self-pay

## 2021-10-31 LAB — CBC WITH DIFFERENTIAL/PLATELET
Basophils Absolute: 0.1 10*3/uL (ref 0.0–0.2)
Basos: 1 %
EOS (ABSOLUTE): 0.2 10*3/uL (ref 0.0–0.4)
Eos: 6 %
Hematocrit: 41.5 % (ref 34.0–46.6)
Hemoglobin: 14.1 g/dL (ref 11.1–15.9)
Immature Grans (Abs): 0 10*3/uL (ref 0.0–0.1)
Immature Granulocytes: 1 %
Lymphocytes Absolute: 2 10*3/uL (ref 0.7–3.1)
Lymphs: 46 %
MCH: 30.9 pg (ref 26.6–33.0)
MCHC: 34 g/dL (ref 31.5–35.7)
MCV: 91 fL (ref 79–97)
Monocytes Absolute: 0.3 10*3/uL (ref 0.1–0.9)
Monocytes: 7 %
Neutrophils Absolute: 1.6 10*3/uL (ref 1.4–7.0)
Neutrophils: 39 %
RBC: 4.57 x10E6/uL (ref 3.77–5.28)
RDW: 13 % (ref 11.7–15.4)
WBC: 4.3 10*3/uL (ref 3.4–10.8)

## 2021-10-31 LAB — LIPID PANEL
Chol/HDL Ratio: 5.5 ratio — ABNORMAL HIGH (ref 0.0–4.4)
Cholesterol, Total: 265 mg/dL — ABNORMAL HIGH (ref 100–199)
HDL: 48 mg/dL (ref >39)
LDL Chol Calc (NIH): 170 mg/dL — ABNORMAL HIGH (ref 0–99)
Triglycerides: 248 mg/dL — ABNORMAL HIGH (ref 0–149)
VLDL Cholesterol Cal: 47 mg/dL — ABNORMAL HIGH (ref 5–40)

## 2021-10-31 LAB — CMP14+EGFR
ALT: 15 IU/L (ref 0–32)
AST: 17 IU/L (ref 0–40)
Albumin/Globulin Ratio: 1.5 (ref 1.2–2.2)
Albumin: 4.7 g/dL (ref 3.9–4.9)
Alkaline Phosphatase: 60 IU/L (ref 44–121)
BUN/Creatinine Ratio: 17 (ref 12–28)
BUN: 13 mg/dL (ref 8–27)
Bilirubin Total: 0.3 mg/dL (ref 0.0–1.2)
CO2: 24 mmol/L (ref 20–29)
Calcium: 9.7 mg/dL (ref 8.7–10.3)
Chloride: 101 mmol/L (ref 96–106)
Creatinine, Ser: 0.78 mg/dL (ref 0.57–1.00)
Globulin, Total: 3.2 g/dL (ref 1.5–4.5)
Glucose: 102 mg/dL — ABNORMAL HIGH (ref 70–99)
Potassium: 4.4 mmol/L (ref 3.5–5.2)
Sodium: 141 mmol/L (ref 134–144)
Total Protein: 7.9 g/dL (ref 6.0–8.5)
eGFR: 84 mL/min/{1.73_m2} (ref >59)

## 2021-11-01 ENCOUNTER — Other Ambulatory Visit (INDEPENDENT_AMBULATORY_CARE_PROVIDER_SITE_OTHER): Payer: Self-pay | Admitting: Primary Care

## 2021-11-01 ENCOUNTER — Other Ambulatory Visit: Payer: Self-pay

## 2021-11-01 DIAGNOSIS — E782 Mixed hyperlipidemia: Secondary | ICD-10-CM

## 2021-11-01 MED ORDER — ROSUVASTATIN CALCIUM 40 MG PO TABS
40.0000 mg | ORAL_TABLET | Freq: Every day | ORAL | 3 refills | Status: DC
  Filled 2021-11-01: qty 90, 90d supply, fill #0

## 2021-11-02 ENCOUNTER — Other Ambulatory Visit: Payer: Self-pay

## 2021-11-02 MED ORDER — FLUOROMETHOLONE 0.1 % OP SUSP
1.0000 [drp] | Freq: Four times a day (QID) | OPHTHALMIC | 0 refills | Status: DC
  Filled 2021-11-02: qty 5, 25d supply, fill #0

## 2021-11-04 ENCOUNTER — Other Ambulatory Visit: Payer: Self-pay

## 2021-11-04 ENCOUNTER — Ambulatory Visit (AMBULATORY_SURGERY_CENTER): Payer: Self-pay | Admitting: *Deleted

## 2021-11-04 VITALS — Ht 66.5 in | Wt 187.2 lb

## 2021-11-04 DIAGNOSIS — Z86010 Personal history of colonic polyps: Secondary | ICD-10-CM

## 2021-11-04 MED ORDER — NA SULFATE-K SULFATE-MG SULF 17.5-3.13-1.6 GM/177ML PO SOLN
1.0000 | Freq: Once | ORAL | 0 refills | Status: AC
  Filled 2021-11-04: qty 354, 2d supply, fill #0

## 2021-11-04 NOTE — Progress Notes (Signed)
No egg or soy allergy known to patient  No issues known to pt with past sedation with any surgeries or procedures Patient denies ever being told they had issues or difficulty with intubation  No FH of Malignant Hyperthermia Pt is not on diet pills Pt is not on home 02  Pt is not on blood thinners  Pt denies issues with constipation  No A fib or A flutter Have any cardiac testing pending--NO Pt instructed to use [email or web address] or GoodRx for a price reduction on prep   

## 2021-11-18 ENCOUNTER — Ambulatory Visit (INDEPENDENT_AMBULATORY_CARE_PROVIDER_SITE_OTHER): Payer: Medicare Other | Admitting: Primary Care

## 2021-11-18 ENCOUNTER — Encounter (INDEPENDENT_AMBULATORY_CARE_PROVIDER_SITE_OTHER): Payer: Self-pay | Admitting: Primary Care

## 2021-11-18 VITALS — BP 137/97 | HR 71 | Temp 98.0°F | Ht 66.5 in | Wt 187.6 lb

## 2021-11-18 DIAGNOSIS — I10 Essential (primary) hypertension: Secondary | ICD-10-CM

## 2021-11-18 DIAGNOSIS — R7303 Prediabetes: Secondary | ICD-10-CM | POA: Diagnosis not present

## 2021-11-18 LAB — POCT GLYCOSYLATED HEMOGLOBIN (HGB A1C): Hemoglobin A1C: 5.6 % (ref 4.0–5.6)

## 2021-11-18 NOTE — Progress Notes (Signed)
[name] is a 66 y.o. female presents for hypertension evaluation, Denies shortness of breath, headaches, chest pain or lower extremity edema, sudden onset, vision changes, unilateral weakness, dizziness, paresthesias   Patient reports adherence with medications.  Dietary habits include: Trying to monitor sodium intake Exercise habits include: Walks when able Family / Social history: Unknown known   Past Medical History:  Diagnosis Date   Allergy    allergy , sinus issues   Anemia    when younger   Arthritis    HANDS,KNEES,"LITTLE BIT"   Hx of blood clots 04/04/1991   previously on Coumadin x 3 months in the past    Hyperlipidemia    on meds   Hypertension    on meds   Positive colorectal cancer screening using Cologuard test 03/02/2021   Past Surgical History:  Procedure Laterality Date   COLONOSCOPY     POLYPECTOMY     tooth pulled with sedation     many years ago   Allergies  Allergen Reactions   Lisinopril     Suspect ACE inhibitor allergy as patient creatinine nearly doubled after initiation of lisinopril   Current Outpatient Medications on File Prior to Visit  Medication Sig Dispense Refill   acetaminophen (TYLENOL) 500 MG tablet Take 1,000 mg by mouth daily as needed for mild pain.     Ascorbic Acid (VITAMIN C PO) Take by mouth daily.     chlorhexidine (PERIDEX) 0.12 % solution Use as directed 15 mLs in the mouth or throat 2 (two) times daily. 473 mL 0   CHLORHEXIDINE GLUCONATE, BULK, SOLN Use a cap full and gargle after brushing teeth twice daily 4000 mL 1   co-enzyme Q-10 30 MG capsule Take 30 mg by mouth 3 (three) times daily.     Cyanocobalamin (VITAMIN B 12 PO) Take by mouth daily.     ECHINACEA PO Take by mouth daily.     fluorometholone (FML) 0.1 % ophthalmic suspension Place 1 drop into the right eye 4 (four) times daily. 5 mL 0   hydrochlorothiazide (HYDRODIURIL) 25 MG tablet TAKE 1 TABLET (25 MG TOTAL) BY  MOUTH DAILY. 90 tablet 1   loratadine (CLARITIN) 10 MG tablet Take 1 tablet (10 mg total) by mouth daily. 30 tablet 11   neomycin-polymyxin b-dexamethasone (MAXITROL) 3.5-10000-0.1 SUSP Place into the right eye.     OVER THE COUNTER MEDICATION daily. probiotic     rosuvastatin (CRESTOR) 40 MG tablet Take 1 tablet (40 mg total) by mouth daily. 90 tablet 3   TURMERIC PO Take by mouth.     VITAMIN D PO Take by mouth.     VITAMIN E PO Take by mouth.     ALOE VERA PO Take by mouth. (Patient not taking: Reported on 11/04/2021)     fluticasone (FLONASE) 50 MCG/ACT nasal spray PLACE 2 SPRAYS INTO BOTH NOSTRILS DAILY. (Patient not taking: Reported on 11/04/2021) 16 g 2   No current facility-administered medications on file prior to visit.   Social History   Socioeconomic History   Marital status: Married    Spouse name: Not on file   Number of children: Not on file   Years of education: Not on file   Highest education level: Not on file  Occupational History   Not on file  Tobacco Use   Smoking status: Never    Passive exposure: Current   Smokeless tobacco: Never  Vaping Use   Vaping Use: Never used  Substance and Sexual Activity   Alcohol use: Yes    Alcohol/week: 2.0 standard drinks of alcohol    Types: 2 Cans of beer per week    Comment: daily   Drug use: No   Sexual activity: Not Currently  Other Topics Concern   Not on file  Social History Narrative   Not on file   Social Determinants of Health   Financial Resource Strain: Not on file  Food Insecurity: Not on file  Transportation Needs: Not on file  Physical Activity: Not on file  Stress: Not on file  Social Connections: Not on file  Intimate Partner Violence: Not on file   Family History  Problem Relation Age of Onset   Breast cancer Neg Hx    Colon cancer Neg Hx    Colon polyps Neg Hx    Esophageal cancer Neg Hx    Rectal cancer Neg Hx    Stomach cancer Neg Hx    Crohn's disease Neg Hx       OBJECTIVE:  Vitals:   11/18/21 0844  BP: (!) 137/97  Pulse: 71  Temp: 98 F (36.7 C)  TempSrc: Oral  SpO2: 97%  Weight: 187 lb 9.6 oz (85.1 kg)  Height: 5' 6.5" (1.689 m)    Physical Exam Vitals reviewed.  Constitutional:      Comments: Overweight  HENT:     Head: Normocephalic.     Right Ear: Tympanic membrane and external ear normal.     Left Ear: Tympanic membrane and external ear normal.     Nose: Nose normal.  Eyes:     Extraocular Movements: Extraocular movements intact.  Cardiovascular:     Rate and Rhythm: Normal rate and regular rhythm.  Pulmonary:     Effort: Pulmonary effort is normal.     Breath sounds: Normal breath sounds.  Abdominal:     General: Bowel sounds are normal. There is distension.     Palpations: Abdomen is soft.  Musculoskeletal:        General: Normal range of motion.     Cervical back: Normal range of motion.  Skin:    General: Skin is warm and dry.  Neurological:     Mental Status: She is alert and oriented to person, place, and time.  Psychiatric:        Mood and Affect: Mood normal.        Behavior: Behavior normal.    ROS Comprehensive ROS Pertinent positive and negative noted in HPI   Last 3 Office BP readings: BP Readings from Last 3 Encounters:  11/18/21 (!) 137/97  10/18/21 130/83  10/06/21 128/89    BMET    Component Value Date/Time   NA 141 10/28/2021 0830   K 4.4 10/28/2021 0830   CL 101 10/28/2021 0830   CO2 24 10/28/2021 0830   GLUCOSE 102 (H) 10/28/2021 0830   GLUCOSE 115 (H) 03/06/2018 1224   BUN 13 10/28/2021 0830   CREATININE 0.78 10/28/2021 0830   CREATININE 1.15 (H) 07/01/2012 1558   CALCIUM 9.7 10/28/2021 0830   GFRNONAA 89 04/30/2020 0859   GFRAA 102 04/30/2020 0859    Renal function: CrCl cannot be calculated (Patient's most recent lab result is older than the maximum 21 days allowed.).  Clinical ASCVD: Yes  The 10-year ASCVD risk score (Arnett DK, et al., 2019) is: 31.7%   Values  used to calculate the score:     Age: 24 years     Sex: Female  Is Non-Hispanic African American: Yes     Diabetic: Yes     Tobacco smoker: No     Systolic Blood Pressure: 325 mmHg     Is BP treated: Yes     HDL Cholesterol: 48 mg/dL     Total Cholesterol: 265 mg/dL  ASCVD risk factors include- [name]   ASSESSMENT & PLAN:  [name] was seen today for blood pressure check.  Diagnoses and all orders for this visit:  Prediabetes -     HgB A1c 5.6-Per ADA guidelines patient is no longer prediabetic.  She will still continue to monitor her carbohydrates i.e. breads, grits, sweets, and sodas -     Cancel: Microalbumin, urine  Essential hypertension -Counseled on lifestyle modifications for blood pressure control including reduced dietary sodium, increased exercise, weight reduction and adequate sleep. Also, educated patient about the risk for cardiovascular events, stroke and heart attack. Also counseled patient about the importance of medication adherence. If you participate in smoking, it is important to stop using tobacco as this will increase the risks associated with uncontrolled blood pressure.   -Hypertension longstanding  Goal BP:  For patients younger than 60: Goal BP < 130/80. For patients 60 and older: Goal BP < 140/90. For patients with diabetes: Goal BP < 130/80. Your most recent BP: 137/97  Minimize salt intake. Minimize alcohol intake    This note has been created with Surveyor, quantity. Any transcriptional errors are unintentional.   Kerin Perna, NP 11/18/2021, 8:46 AM

## 2021-11-25 ENCOUNTER — Encounter: Payer: Self-pay | Admitting: Internal Medicine

## 2021-11-25 ENCOUNTER — Ambulatory Visit (AMBULATORY_SURGERY_CENTER): Payer: Medicare Other | Admitting: Internal Medicine

## 2021-11-25 VITALS — BP 147/92 | HR 77 | Temp 97.3°F | Resp 12 | Ht 66.5 in | Wt 187.0 lb

## 2021-11-25 DIAGNOSIS — Z09 Encounter for follow-up examination after completed treatment for conditions other than malignant neoplasm: Secondary | ICD-10-CM | POA: Diagnosis not present

## 2021-11-25 DIAGNOSIS — K635 Polyp of colon: Secondary | ICD-10-CM

## 2021-11-25 DIAGNOSIS — D123 Benign neoplasm of transverse colon: Secondary | ICD-10-CM | POA: Diagnosis not present

## 2021-11-25 DIAGNOSIS — Z86010 Personal history of colonic polyps: Secondary | ICD-10-CM

## 2021-11-25 DIAGNOSIS — D122 Benign neoplasm of ascending colon: Secondary | ICD-10-CM | POA: Diagnosis not present

## 2021-11-25 MED ORDER — SODIUM CHLORIDE 0.9 % IV SOLN: 500.0000 mL | Freq: Once | INTRAVENOUS | Status: DC

## 2021-11-25 NOTE — Progress Notes (Signed)
Called to room to assist during endoscopic procedure.  Patient ID and intended procedure confirmed with present staff. Received instructions for my participation in the procedure from the performing physician.  

## 2021-11-25 NOTE — Patient Instructions (Signed)
Handout on polyps, hemorrhoids provided   Await pathology results.   Continue current medications.   Repeat colonoscopy in 1 year  . YOU HAD AN ENDOSCOPIC PROCEDURE TODAY AT [facility]:   Refer to the procedure report that was given to you for any specific questions about what was found during the examination.  If the procedure report does not answer your questions, please call your gastroenterologist to clarify.  If you requested that your care partner not be given the details of your procedure findings, then the procedure report has been included in a sealed envelope for you to review at your convenience later.  YOU SHOULD EXPECT: Some feelings of bloating in the abdomen. Passage of more gas than usual.  Walking can help get rid of the air that was put into your GI tract during the procedure and reduce the bloating. If you had a lower endoscopy (such as a colonoscopy or flexible sigmoidoscopy) you may notice spotting of blood in your stool or on the toilet paper. If you underwent a bowel prep for your procedure, you may not have a normal bowel movement for a few days.  Please Note:  You might notice some irritation and congestion in your nose or some drainage.  This is from the oxygen used during your procedure.  There is no need for concern and it should clear up in a day or so.  SYMPTOMS TO REPORT IMMEDIATELY:  Following lower endoscopy (colonoscopy or flexible sigmoidoscopy):  Excessive amounts of blood in the stool  Significant tenderness or worsening of abdominal pains  Swelling of the abdomen that is new, acute  Fever of 100F or higher   For urgent or emergent issues, a gastroenterologist can be reached at any hour by calling [phone]. Do not use MyChart messaging for urgent concerns.    DIET:  We do recommend a small meal at first, but then you may proceed to your regular diet.  Drink plenty of fluids but you should avoid alcoholic beverages for 24  hours.  ACTIVITY:  You should plan to take it easy for the rest of today and you should NOT DRIVE or use heavy machinery until tomorrow (because of the sedation medicines used during the test).    FOLLOW UP: Our staff will call the number listed on your records the next business day following your procedure.  We will call around 7:15- 8:00 am to check on you and address any questions or concerns that you may have regarding the information given to you following your procedure. If we do not reach you, we will leave a message.  If you develop any symptoms (ie: fever, flu-like symptoms, shortness of breath, cough etc.) before then, please call [phone].  If you test positive for Covid 19 in the 2 weeks post procedure, please call and report this information to [place].    If any biopsies were taken you will be contacted by phone or by letter within the next 1-3 weeks.  Please call us at [phone] if you have not heard about the biopsies in 3 weeks.    SIGNATURES/CONFIDENTIALITY: You and/or your care partner have signed paperwork which will be entered into your electronic medical record.  These signatures attest to the fact that that the information above on your After Visit Summary has been reviewed and is understood.  Full responsibility of the confidentiality of this discharge information lies with you and/or your care-partner.

## 2021-11-25 NOTE — Progress Notes (Signed)
PT taken to PACU. Monitors in place. VSS. Report given to RN. 

## 2021-11-25 NOTE — Op Note (Signed)
[facility] Patient Name: [name] Procedure Date: 11/25/2021 11:10 AM MRN: [id number] Endoscopist: Sonny Masters "[name] ,  Age: 66 Referring MD:  Date of Birth: 1955-05-05 Gender: Female Account #: [id number] Procedure:                Colonoscopy Indications:              High risk colon cancer surveillance: Personal                            history of colonic polyps Medicines:                Monitored Anesthesia Care Procedure:                Pre-Anesthesia Assessment:                           - Prior to the procedure, a History and Physical                            was performed, and patient medications and                            allergies were reviewed. The patient's tolerance of                            previous anesthesia was also reviewed. The risks                            and benefits of the procedure and the sedation                            options and risks were discussed with the patient.                            All questions were answered, and informed consent                            was obtained. Prior Anticoagulants: The patient has                            taken no previous anticoagulant or antiplatelet                            agents. ASA Grade Assessment: II - A patient with                            mild systemic disease. After reviewing the risks                            and benefits, the patient was deemed in                            satisfactory condition to undergo the procedure.  After obtaining informed consent, the colonoscope                            was passed under direct vision. Throughout the                            procedure, the patient's blood pressure, Ford, and                            oxygen saturations were monitored continuously. The                            CF [id number] #[id number] was introduced through the anus                            and advanced to the the terminal  ileum. The                            colonoscopy was performed without difficulty. The                            patient tolerated the procedure well. Scope In: 11:20:54 AM Scope Out: 12:00:19 PM Scope Withdrawal Time: 0 hours 33 minutes 48 seconds  Total Procedure Duration: 0 hours 39 minutes 25 seconds  Findings:                 The terminal ileum appeared normal.                           A 4 mm polyp was found in the ascending colon. The                            polyp was sessile. The polyp was removed with a                            cold snare. Resection and retrieval were complete.                           Four sessile polyps were found in the transverse                            colon. The polyps were 2 to 4 mm in size. These                            polyps were removed with a cold snare. Resection                            and retrieval were complete.                           Non-bleeding internal hemorrhoids were found during                            retroflexion. Complications:  No immediate complications. Estimated Blood Loss:     Estimated blood loss was minimal. Impression:               - The examined portion of the ileum was normal.                           - One 4 mm polyp in the ascending colon, removed                            with a cold snare. Resected and retrieved.                           - Four 2 to 4 mm polyps in the transverse colon,                            removed with a cold snare. Resected and retrieved.                           - Non-bleeding internal hemorrhoids. Recommendation:           - Discharge patient to home (with escort).                           - Await pathology results.                           - The findings and recommendations were discussed                            with the patient.                           - Repeat colonoscopy in 1 year for surveillance. Dr Georgian Co "Lyndee Leo" Lorenso Courier,   11/25/2021 12:14:17 PM

## 2021-11-25 NOTE — Progress Notes (Signed)
GASTROENTEROLOGY PROCEDURE H&P NOTE   Primary Care Physician: Kerin Perna, NP    Reason for Procedure:   History of colon polyps  Plan:    Colonoscopy  Patient is appropriate for endoscopic procedure(s) in the ambulatory ([facility]) setting.  The nature of the procedure, as well as the risks, benefits, and alternatives were carefully and thoroughly reviewed with the patient. Ample time for discussion and questions allowed. The patient understood, was satisfied, and agreed to proceed.     HPI: [name] is a 66 y.o. female who presents for colonoscopy for history of colon polyps.  Past Medical History:  Diagnosis Date   Allergy    allergy , sinus issues   Anemia    when younger   Arthritis    HANDS,KNEES,"LITTLE BIT"   Hx of blood clots 04/04/1991   previously on Coumadin x 3 months in the past    Hyperlipidemia    on meds   Hypertension    on meds   Positive colorectal cancer screening using Cologuard test 03/02/2021    Past Surgical History:  Procedure Laterality Date   COLONOSCOPY     POLYPECTOMY     tooth pulled with sedation     many years ago    Prior to Admission medications   Medication Sig Start Date End Date Taking? Authorizing Provider  Ascorbic Acid (VITAMIN C PO) Take by mouth daily.   Yes [provider]  chlorhexidine (PERIDEX) 0.12 % solution Use as directed 15 mLs in the mouth or throat 2 (two) times daily. 09/29/21  Yes Charlott Rakes, MD  CHLORHEXIDINE GLUCONATE, BULK, SOLN Use a cap full and gargle after brushing teeth twice daily 09/29/21  Yes Kerin Perna, NP  co-enzyme Q-10 30 MG capsule Take 30 mg by mouth 3 (three) times daily.   Yes [provider]  Cyanocobalamin (VITAMIN B 12 PO) Take by mouth daily.   Yes [provider]  ECHINACEA PO Take by mouth daily.   Yes [provider]  fluorometholone (FML) 0.1 % ophthalmic suspension Place 1 drop into the right eye 4 (four) times daily.  10/31/21  Yes   hydrochlorothiazide (HYDRODIURIL) 25 MG tablet TAKE 1 TABLET (25 MG TOTAL) BY MOUTH DAILY. 10/02/21 10/02/22 Yes Gildardo Pounds, NP  neomycin-polymyxin b-dexamethasone (MAXITROL) 3.5-10000-0.1 SUSP Place into the right eye. 10/24/21  Yes [provider]  OVER THE COUNTER MEDICATION daily. probiotic   Yes [provider]  rosuvastatin (CRESTOR) 40 MG tablet Take 1 tablet (40 mg total) by mouth daily. 11/01/21  Yes Kerin Perna, NP  TURMERIC PO Take by mouth.   Yes [provider]  VITAMIN D PO Take by mouth.   Yes [provider]  VITAMIN E PO Take by mouth.   Yes [provider]  acetaminophen (TYLENOL) 500 MG tablet Take 1,000 mg by mouth daily as needed for mild pain.    [provider]  ALOE VERA PO Take by mouth. Patient not taking: Reported on 11/04/2021    [provider]  fluticasone (FLONASE) 50 MCG/ACT nasal spray PLACE 2 SPRAYS INTO BOTH NOSTRILS DAILY. Patient not taking: Reported on 11/04/2021 09/29/21 09/29/22  Kerin Perna, NP  loratadine (CLARITIN) 10 MG tablet Take 1 tablet (10 mg total) by mouth daily. 09/29/21   Kerin Perna, NP    Current Outpatient Medications  Medication Sig Dispense Refill   Ascorbic Acid (VITAMIN C PO) Take by mouth daily.  chlorhexidine (PERIDEX) 0.12 % solution Use as directed 15 mLs in the mouth or throat 2 (two) times daily. 473 mL 0   CHLORHEXIDINE GLUCONATE, BULK, SOLN Use a cap full and gargle after brushing teeth twice daily 4000 mL 1   co-enzyme Q-10 30 MG capsule Take 30 mg by mouth 3 (three) times daily.     Cyanocobalamin (VITAMIN B 12 PO) Take by mouth daily.     ECHINACEA PO Take by mouth daily.     fluorometholone (FML) 0.1 % ophthalmic suspension Place 1 drop into the right eye 4 (four) times daily. 5 mL 0   hydrochlorothiazide (HYDRODIURIL) 25 MG tablet TAKE 1 TABLET (25 MG TOTAL) BY MOUTH DAILY. 90 tablet 1   neomycin-polymyxin b-dexamethasone  (MAXITROL) 3.5-10000-0.1 SUSP Place into the right eye.     OVER THE COUNTER MEDICATION daily. probiotic     rosuvastatin (CRESTOR) 40 MG tablet Take 1 tablet (40 mg total) by mouth daily. 90 tablet 3   TURMERIC PO Take by mouth.     VITAMIN D PO Take by mouth.     VITAMIN E PO Take by mouth.     acetaminophen (TYLENOL) 500 MG tablet Take 1,000 mg by mouth daily as needed for mild pain.     ALOE VERA PO Take by mouth. (Patient not taking: Reported on 11/04/2021)     fluticasone (FLONASE) 50 MCG/ACT nasal spray PLACE 2 SPRAYS INTO BOTH NOSTRILS DAILY. (Patient not taking: Reported on 11/04/2021) 16 g 2   loratadine (CLARITIN) 10 MG tablet Take 1 tablet (10 mg total) by mouth daily. 30 tablet 11   Current Facility-Administered Medications  Medication Dose Route Frequency Provider Last Rate Last Admin   0.9 %  sodium chloride infusion  500 mL Intravenous Once Sharyn Creamer, MD        Allergies as of 11/25/2021 - Review Complete 11/25/2021  Allergen Reaction Noted   Lisinopril  07/02/2012    Family History  Problem Relation Age of Onset   Breast cancer Neg Hx    Colon cancer Neg Hx    Colon polyps Neg Hx    Esophageal cancer Neg Hx    Rectal cancer Neg Hx    Stomach cancer Neg Hx    Crohn's disease Neg Hx     Social History   Socioeconomic History   Marital status: Married    Spouse name: Not on file   Number of children: Not on file   Years of education: Not on file   Highest education level: Not on file  Occupational History   Not on file  Tobacco Use   Smoking status: Never    Passive exposure: Current   Smokeless tobacco: Never  Vaping Use   Vaping Use: Never used  Substance and Sexual Activity   Alcohol use: Yes    Alcohol/week: 2.0 standard drinks of alcohol    Types: 2 Cans of beer per week    Comment: daily   Drug use: No   Sexual activity: Not Currently  Other Topics Concern   Not on file  Social History Narrative   Not on file   Social Determinants of  Health   Financial Resource Strain: Not on file  Food Insecurity: Not on file  Transportation Needs: Not on file  Physical Activity: Not on file  Stress: Not on file  Social Connections: Not on file  Intimate Partner Violence: Not on file    Physical Exam: Vital signs in last 24 hours: BP  133/89 (BP Location: Right Arm, Patient Position: Sitting, Cuff Size: Normal)   Pulse 70   Temp (!) 97.3 F (36.3 C) (Temporal)   Ht 5' 6.5" (1.689 m)   Wt 187 lb (84.8 kg)   SpO2 98%   BMI 29.73 kg/m  GEN: NAD EYE: Sclerae anicteric ENT: MMM CV: Non-tachycardic Pulm: No increased work of breathing GI: Soft, NT/ND NEURO:  Alert & Oriented   [name], MD [facility]  11/25/2021 11:09 AM

## 2021-11-25 NOTE — Progress Notes (Signed)
Vitals-AS  Pt's states no medical or surgical changes since previsit or office visit.  

## 2021-11-28 ENCOUNTER — Telehealth: Payer: Self-pay

## 2021-11-28 NOTE — Telephone Encounter (Signed)
Patient called to state she was doing fine following the procedure done this past Friday.

## 2021-11-28 NOTE — Telephone Encounter (Signed)
Attempted to reach pt. With follow-up call following endoscopic procedure 11/25/2021.  No answer, no answering machine pick-up, unable to leave message.

## 2021-11-29 ENCOUNTER — Encounter: Payer: Self-pay | Admitting: Internal Medicine

## 2021-12-23 ENCOUNTER — Other Ambulatory Visit (INDEPENDENT_AMBULATORY_CARE_PROVIDER_SITE_OTHER): Payer: Self-pay

## 2021-12-23 DIAGNOSIS — E782 Mixed hyperlipidemia: Secondary | ICD-10-CM

## 2021-12-23 MED ORDER — ROSUVASTATIN CALCIUM 40 MG PO TABS: 40.0000 mg | ORAL_TABLET | Freq: Every day | ORAL | 3 refills | Status: DC

## 2022-01-02 ENCOUNTER — Other Ambulatory Visit: Payer: Self-pay

## 2022-01-09 ENCOUNTER — Other Ambulatory Visit: Payer: Self-pay

## 2022-01-09 ENCOUNTER — Other Ambulatory Visit (INDEPENDENT_AMBULATORY_CARE_PROVIDER_SITE_OTHER): Payer: Self-pay | Admitting: Primary Care

## 2022-01-09 DIAGNOSIS — I10 Essential (primary) hypertension: Secondary | ICD-10-CM

## 2022-01-09 DIAGNOSIS — Z760 Encounter for issue of repeat prescription: Secondary | ICD-10-CM

## 2022-01-09 NOTE — Telephone Encounter (Signed)
Pt has one refill available at [facility] that she is able to pick up today / she needs additional refills for Hydrochlorothiazide to be sent to her new preferred pharmacy which will be [facility] for now on / please advise

## 2022-01-09 NOTE — Telephone Encounter (Unsigned)
Copied from [facility] [id number]. Topic: General - Other >> Jan 09, 2022 10:22 AM Everette C wrote: Reason for CRM: Medication Refill - Medication: Rx #: [id number]  hydrochlorothiazide (HYDRODIURIL) 25 MG tablet [[id number]]    Has the patient contacted their pharmacy? Yes. The patient has been directed to contact their PCP  (Agent: If no, request that the patient contact the pharmacy for the refill. If patient does not wish to contact the pharmacy document the reason why and proceed with request.) (Agent: If yes, when and what did the pharmacy advise?)  Preferred Pharmacy (with phone number or street name): [facility], [address] [place] [address] Phone: [phone] Fax: [phone] Hours: Not open 24 hours   Has the patient been seen for an appointment in the last year OR does the patient have an upcoming appointment? Yes.    Agent: Please be advised that RX refills may take up to 3 business days. We ask that you follow-up with your pharmacy.

## 2022-01-10 NOTE — Telephone Encounter (Signed)
Unable to refill per protocol, request is too soon. Last refill 10/02/21 for 90 and 1 rf. Patient request additional refills go to mail order pharmacy. Will refuse too soon request.   Requested Prescriptions  Pending Prescriptions Disp Refills  . hydrochlorothiazide (HYDRODIURIL) 25 MG tablet 90 tablet 1    Sig: TAKE 1 TABLET (25 MG TOTAL) BY MOUTH DAILY.     Cardiovascular: Diuretics - Thiazide Failed - 01/09/2022  1:02 PM      Failed - Last BP in normal range    BP Readings from Last 1 Encounters:  11/25/21 (!) 147/92         Passed - Cr in normal range and within 180 days    Creat  Date Value Ref Range Status  07/01/2012 1.15 (H) 0.50 - 1.10 mg/dL Final   Creatinine, Ser  Date Value Ref Range Status  10/28/2021 0.78 0.57 - 1.00 mg/dL Final         Passed - K in normal range and within 180 days    Potassium  Date Value Ref Range Status  10/28/2021 4.4 3.5 - 5.2 mmol/L Final         Passed - Na in normal range and within 180 days    Sodium  Date Value Ref Range Status  10/28/2021 141 134 - 144 mmol/L Final         Passed - Valid encounter within last 6 months    Recent Outpatient Visits          1 month ago Prediabetes   [facility], Mary P, NP   3 months ago History of colonic polyps   [place], Mary P, NP   11 months ago Colon cancer screening   [place] Kerin Perna, NP   1 year ago Cervical cancer screening   [facility], Mary P, NP   1 year ago Essential hypertension   [place] Kerin Perna, NP      Future Appointments            In 2 weeks Oletta Lamas, Milford Cage, NP [place] [facility]

## 2022-01-12 ENCOUNTER — Other Ambulatory Visit (INDEPENDENT_AMBULATORY_CARE_PROVIDER_SITE_OTHER): Payer: Self-pay

## 2022-01-12 DIAGNOSIS — I10 Essential (primary) hypertension: Secondary | ICD-10-CM

## 2022-01-12 DIAGNOSIS — Z760 Encounter for issue of repeat prescription: Secondary | ICD-10-CM

## 2022-01-12 MED ORDER — HYDROCHLOROTHIAZIDE 25 MG PO TABS: ORAL_TABLET | Freq: Every day | ORAL | 1 refills | Status: DC

## 2022-01-30 ENCOUNTER — Encounter (INDEPENDENT_AMBULATORY_CARE_PROVIDER_SITE_OTHER): Payer: Medicare Other | Admitting: Primary Care

## 2022-01-30 ENCOUNTER — Other Ambulatory Visit: Payer: Self-pay

## 2022-01-30 ENCOUNTER — Telehealth (INDEPENDENT_AMBULATORY_CARE_PROVIDER_SITE_OTHER): Payer: Medicare Other | Admitting: Primary Care

## 2022-01-30 DIAGNOSIS — K029 Dental caries, unspecified: Secondary | ICD-10-CM | POA: Diagnosis not present

## 2022-01-30 MED ORDER — IBUPROFEN 600 MG PO TABS
600.0000 mg | ORAL_TABLET | Freq: Three times a day (TID) | ORAL | 1 refills | Status: DC | PRN
  Filled 2022-01-30: qty 60, 20d supply, fill #0
  Filled 2022-03-28: qty 60, 20d supply, fill #1

## 2022-01-30 NOTE — Progress Notes (Signed)
[facility] Telephone Note  I connected with [name], on 01/30/2022 at 10:25 AM telephone and verified that I am speaking with the correct person using two identifiers.   Consent: I discussed the limitations, risks, security and privacy concerns of performing an evaluation and management service by telephone and the availability of in person appointments. I also discussed with the patient that there may be a patient responsible charge related to this service. The patient expressed understanding and agreed to proceed.   Location of Patient: Home  Location of Provider: [facility]   Persons participating in Telemedicine visit: Pleshette Tomasini,  NP  History of Present Illness: [name] is a 66 year old female who is having a tele visit due to dental pain. She is requesting something to relieve her pain. She has a schedule dental surgery 03/21/22 .  She also voices difficulty with chewing and teeth are sensitive to temperature change advise patient to call the dentist with these s/s. Past Medical History:  Diagnosis Date   Allergy    allergy , sinus issues   Anemia    when younger   Arthritis    HANDS,KNEES,"LITTLE BIT"   Hx of blood clots 04/04/1991   previously on Coumadin x 3 months in the past    Hyperlipidemia    on meds   Hypertension    on meds   Positive colorectal cancer screening using Cologuard test 03/02/2021   Allergies  Allergen Reactions   Lisinopril     Suspect ACE inhibitor allergy as patient creatinine nearly doubled after initiation of lisinopril    Current Outpatient Medications on File Prior to Visit  Medication Sig Dispense Refill   acetaminophen (TYLENOL) 500 MG tablet Take 1,000 mg by mouth daily as needed for mild pain.     ALOE VERA PO Take by mouth. (Patient not taking: Reported on 11/04/2021)     Ascorbic Acid (VITAMIN C PO) Take by mouth  daily.     chlorhexidine (PERIDEX) 0.12 % solution Use as directed 15 mLs in the mouth or throat 2 (two) times daily. 473 mL 0   CHLORHEXIDINE GLUCONATE, BULK, SOLN Use a cap full and gargle after brushing teeth twice daily 4000 mL 1   co-enzyme Q-10 30 MG capsule Take 30 mg by mouth 3 (three) times daily.     Cyanocobalamin (VITAMIN B 12 PO) Take by mouth daily.     ECHINACEA PO Take by mouth daily.     fluorometholone (FML) 0.1 % ophthalmic suspension Place 1 drop into the right eye 4 (four) times daily. 5 mL 0   fluticasone (FLONASE) 50 MCG/ACT nasal spray PLACE 2 SPRAYS INTO BOTH NOSTRILS DAILY. (Patient not taking: Reported on 11/04/2021) 16 g 2   hydrochlorothiazide (HYDRODIURIL) 25 MG tablet TAKE 1 TABLET (25 MG TOTAL) BY MOUTH DAILY. 90 tablet 1   loratadine (CLARITIN) 10 MG tablet Take 1 tablet (10 mg total) by mouth daily. 30 tablet 11   neomycin-polymyxin b-dexamethasone (MAXITROL) 3.5-10000-0.1 SUSP Place into the right eye.     OVER THE COUNTER MEDICATION daily. probiotic     rosuvastatin (CRESTOR) 40 MG tablet Take 1 tablet (40 mg total) by mouth daily. 90 tablet 3   TURMERIC PO Take by mouth.     VITAMIN D PO Take by mouth.     VITAMIN E PO Take by mouth.     No current facility-administered medications on file prior  to visit.    Observations/Objective: There were no vitals taken for this visit.   Assessment and Plan: Diagnoses and all orders for this visit:  Dental caries -     ibuprofen (ADVIL) 600 MG tablet; Take 1 tablet (600 mg total) by mouth every 8 (eight) hours as needed.     Follow Up Instructions: Call dentist and schedule appointment first available   I discussed the assessment and treatment plan with the patient. The patient was provided an opportunity to ask questions and all were answered. The patient agreed with the plan and demonstrated an understanding of the instructions.   The patient was advised to call back or seek an in-person evaluation if the  symptoms worsen or if the condition fails to improve as anticipated.     I provided 10 minutes total of non-face-to-face time during this encounter including median intraservice time, reviewing previous notes, investigations, ordering medications, medical decision making, coordinating care and patient verbalized understanding at the end of the visit.    This note has been created with Surveyor, quantity. Any transcriptional errors are unintentional.   Kerin Perna, NP 01/30/2022, 10:25 AM

## 2022-03-06 ENCOUNTER — Ambulatory Visit (INDEPENDENT_AMBULATORY_CARE_PROVIDER_SITE_OTHER): Payer: Medicare Other | Admitting: Primary Care

## 2022-03-15 ENCOUNTER — Other Ambulatory Visit: Payer: Self-pay | Admitting: Primary Care

## 2022-03-15 DIAGNOSIS — Z1231 Encounter for screening mammogram for malignant neoplasm of breast: Secondary | ICD-10-CM

## 2022-03-20 ENCOUNTER — Other Ambulatory Visit: Payer: Self-pay

## 2022-03-20 ENCOUNTER — Encounter (HOSPITAL_COMMUNITY): Payer: Self-pay | Admitting: Oral Surgery

## 2022-03-20 NOTE — Progress Notes (Signed)
PCP - Juluis Mire, NP  EKG - DOS ECHO - 06/07/12  ERAS Protcol - NPO  Anesthesia review: N  Patient verbally denies any shortness of breath, fever, cough and chest pain during phone call   -------------  SDW INSTRUCTIONS given:  Your procedure is scheduled on 03/21/22.  Report to [facility] Main Entrance "A" at [email or web address]., and check in at the Admitting office.  Call this number if you have problems the morning of surgery:  [phone]   Remember:  Do not eat or drink after midnight the night before your surgery    Take these medicines the morning of surgery with A SIP OF WATER  N/A  As of today, STOP taking any Aspirin (unless otherwise instructed by your surgeon) Aleve, Naproxen, Ibuprofen, Motrin, Advil, Goody's, BC's, all herbal medications, fish oil, and all vitamins.                      Do not wear jewelry, make up, or nail polish            Do not wear lotions, powders, perfumes/colognes, or deodorant.            Do not shave 48 hours prior to surgery.  Men may shave face and neck.            Do not bring valuables to the hospital.            [facility] is not responsible for any belongings or valuables.  Do NOT Smoke (Tobacco/Vaping) 24 hours prior to your procedure If you use a CPAP at night, you may bring all equipment for your overnight stay.   Contacts, glasses, dentures or bridgework may not be worn into surgery.      For patients admitted to the hospital, discharge time will be determined by your treatment team.   Patients discharged the day of surgery will not be allowed to drive home, and someone needs to stay with them for 24 hours.    Special instructions:   [facility]- Preparing For Surgery  Before surgery, you can play an important role. Because skin is not sterile, your skin needs to be as free of germs as possible. You can reduce the number of germs on your skin by washing with CHG (chlorahexidine gluconate) Soap before surgery.  CHG is an  antiseptic cleaner which kills germs and bonds with the skin to continue killing germs even after washing.    Oral Hygiene is also important to reduce your risk of infection.  Remember - BRUSH YOUR TEETH THE MORNING OF SURGERY WITH YOUR REGULAR TOOTHPASTE  Please do not use if you have an allergy to CHG or antibacterial soaps. If your skin becomes reddened/irritated stop using the CHG.  Do not shave (including legs and underarms) for at least 48 hours prior to first CHG shower. It is OK to shave your face.  Please follow these instructions carefully.   Shower the NIGHT BEFORE SURGERY and the MORNING OF SURGERY with DIAL Soap.   Pat yourself dry with a CLEAN TOWEL.  Wear CLEAN PAJAMAS to bed the night before surgery  Place CLEAN SHEETS on your bed the night of your first shower and DO NOT SLEEP WITH PETS.   Day of Surgery: Please shower morning of surgery  Wear Clean/Comfortable clothing the morning of surgery Do not apply any deodorants/lotions.   Remember to brush your teeth WITH YOUR REGULAR TOOTHPASTE.   Questions were answered. Patient verbalized understanding  of instructions.

## 2022-03-20 NOTE — H&P (Signed)
  Patient: [name]  PID: [id number]  DOB: Feb 02, 1956  SEX: Female   Patient referred by DDS for evaluation hyperplastic gingiva.  CC: Occasional pain lower right.  Past Medical History:  Bruise Easily, Swollen ankles, Dental anxiety   Medications: HCTZ, Pravastatin    Allergies:     NKDA    Surgeries:   Colonoscopy         Social History       Smoking:  n          Alcohol:n Drug use:n                             Exam:  Mobile tooth #32 with reddish hyperplastic gingiva around tooth. Severe bone loss all remaining teeth, carious # 5, 6, 10.  No purulence, edema, fluctuance, trismus. Oral cancer screening negative. Pharynx clear. No lymphadenopathy.  Panorex:Severe bone loss, multiple carious teeth, Bone erosion # 32 alveolar crest with tooth #32  enveloped in soft tissue, no bone contact.   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         Plan: 1. Tx plan from Dr Carlota Raspberry  2. FMX, alveo, biopsy tissue #32.; Hospital.                  Rx: none              Risks and complications explained. Questions answered.   [name], DMD

## 2022-03-21 ENCOUNTER — Encounter (HOSPITAL_COMMUNITY): Payer: Self-pay | Admitting: Oral Surgery

## 2022-03-21 ENCOUNTER — Encounter (HOSPITAL_COMMUNITY)
Admission: RE | Disposition: A | Discharge status: Home / Self Care | Payer: Self-pay | Source: Home / Self Care | Attending: Oral Surgery

## 2022-03-21 ENCOUNTER — Ambulatory Visit (HOSPITAL_BASED_OUTPATIENT_CLINIC_OR_DEPARTMENT_OTHER): Payer: Medicare Other | Admitting: Certified Registered Nurse Anesthetist

## 2022-03-21 ENCOUNTER — Ambulatory Visit (HOSPITAL_COMMUNITY): Payer: Medicare Other | Admitting: Certified Registered Nurse Anesthetist

## 2022-03-21 ENCOUNTER — Other Ambulatory Visit: Payer: Self-pay

## 2022-03-21 ENCOUNTER — Ambulatory Visit (HOSPITAL_COMMUNITY)
Admission: RE | Admit: 2022-03-21 | Discharge: 2022-03-21 | Disposition: A | Discharge status: Home / Self Care | Payer: Medicare Other | Attending: Oral Surgery | Admitting: Oral Surgery

## 2022-03-21 DIAGNOSIS — E785 Hyperlipidemia, unspecified: Secondary | ICD-10-CM | POA: Insufficient documentation

## 2022-03-21 DIAGNOSIS — M270 Developmental disorders of jaws: Secondary | ICD-10-CM

## 2022-03-21 DIAGNOSIS — Z79899 Other long term (current) drug therapy: Secondary | ICD-10-CM | POA: Diagnosis not present

## 2022-03-21 DIAGNOSIS — K056 Periodontal disease, unspecified: Secondary | ICD-10-CM | POA: Insufficient documentation

## 2022-03-21 DIAGNOSIS — I10 Essential (primary) hypertension: Secondary | ICD-10-CM

## 2022-03-21 DIAGNOSIS — M1990 Unspecified osteoarthritis, unspecified site: Secondary | ICD-10-CM | POA: Diagnosis not present

## 2022-03-21 DIAGNOSIS — M278 Other specified diseases of jaws: Secondary | ICD-10-CM

## 2022-03-21 DIAGNOSIS — K0889 Other specified disorders of teeth and supporting structures: Secondary | ICD-10-CM

## 2022-03-21 DIAGNOSIS — K029 Dental caries, unspecified: Secondary | ICD-10-CM | POA: Diagnosis not present

## 2022-03-21 HISTORY — PX: TOOTH EXTRACTION: SHX859

## 2022-03-21 LAB — BASIC METABOLIC PANEL
Anion gap: 10 (ref 5–15)
BUN: 16 mg/dL (ref 8–23)
CO2: 27 mmol/L (ref 22–32)
Calcium: 9 mg/dL (ref 8.9–10.3)
Chloride: 103 mmol/L (ref 98–111)
Creatinine, Ser: 0.77 mg/dL (ref 0.44–1.00)
GFR, Estimated: >60 mL/min (ref >60)
Glucose, Bld: 104 mg/dL — ABNORMAL HIGH (ref 70–99)
Potassium: 3.3 mmol/L — ABNORMAL LOW (ref 3.5–5.1)
Sodium: 140 mmol/L (ref 135–145)

## 2022-03-21 LAB — CBC
HCT: 38.9 % (ref 36.0–46.0)
Hemoglobin: 13.1 g/dL (ref 12.0–15.0)
MCH: 30.5 pg (ref 26.0–34.0)
MCHC: 33.7 g/dL (ref 30.0–36.0)
MCV: 90.7 fL (ref 80.0–100.0)
Platelets: 219 10*3/uL (ref 150–400)
RBC: 4.29 MIL/uL (ref 3.87–5.11)
RDW: 12.9 % (ref 11.5–15.5)
WBC: 4.6 10*3/uL (ref 4.0–10.5)
nRBC: 0 % (ref 0.0–0.2)

## 2022-03-21 SURGERY — DENTAL RESTORATION/EXTRACTIONS
Anesthesia: General | Site: Mouth

## 2022-03-21 MED ORDER — PHENYLEPHRINE 80 MCG/ML (10ML) SYRINGE FOR IV PUSH (FOR BLOOD PRESSURE SUPPORT)
PREFILLED_SYRINGE | INTRAVENOUS | Status: AC
  Filled 2022-03-21: qty 10

## 2022-03-21 MED ORDER — LIDOCAINE 2% (20 MG/ML) 5 ML SYRINGE
INTRAMUSCULAR | Status: DC | PRN
  Administered 2022-03-21: 60 mg via INTRAVENOUS

## 2022-03-21 MED ORDER — PHENYLEPHRINE HCL (PRESSORS) 10 MG/ML IV SOLN
INTRAVENOUS | Status: DC | PRN
  Administered 2022-03-21: 160 ug via INTRAVENOUS
  Administered 2022-03-21: 80 ug via INTRAVENOUS
  Administered 2022-03-21: 160 ug via INTRAVENOUS

## 2022-03-21 MED ORDER — LIDOCAINE 2% (20 MG/ML) 5 ML SYRINGE
INTRAMUSCULAR | Status: AC
  Filled 2022-03-21: qty 5

## 2022-03-21 MED ORDER — ROCURONIUM BROMIDE 10 MG/ML (PF) SYRINGE
PREFILLED_SYRINGE | INTRAVENOUS | Status: AC
  Filled 2022-03-21: qty 10

## 2022-03-21 MED ORDER — FENTANYL CITRATE (PF) 100 MCG/2ML IJ SOLN: 50.0000 ug | INTRAMUSCULAR | Status: DC | PRN

## 2022-03-21 MED ORDER — OXYMETAZOLINE HCL 0.05 % NA SOLN
NASAL | Status: DC | PRN
  Administered 2022-03-21: 1

## 2022-03-21 MED ORDER — CHLORHEXIDINE GLUCONATE 0.12 % MT SOLN
15.0000 mL | Freq: Once | OROMUCOSAL | Status: AC
  Administered 2022-03-21: 15 mL via OROMUCOSAL
  Filled 2022-03-21: qty 15

## 2022-03-21 MED ORDER — ONDANSETRON HCL 4 MG/2ML IJ SOLN: 4.0000 mg | Freq: Once | INTRAMUSCULAR | Status: DC | PRN

## 2022-03-21 MED ORDER — ONDANSETRON HCL 4 MG/2ML IJ SOLN
INTRAMUSCULAR | Status: AC
  Filled 2022-03-21: qty 2

## 2022-03-21 MED ORDER — PROPOFOL 10 MG/ML IV BOLUS
INTRAVENOUS | Status: DC | PRN
  Administered 2022-03-21: 120 mg via INTRAVENOUS

## 2022-03-21 MED ORDER — LIDOCAINE-EPINEPHRINE 2 %-1:100000 IJ SOLN
INTRAMUSCULAR | Status: DC | PRN
  Administered 2022-03-21: 20 mL

## 2022-03-21 MED ORDER — ONDANSETRON HCL 4 MG/2ML IJ SOLN
INTRAMUSCULAR | Status: DC | PRN
  Administered 2022-03-21: 4 mg via INTRAVENOUS

## 2022-03-21 MED ORDER — FENTANYL CITRATE (PF) 250 MCG/5ML IJ SOLN
INTRAMUSCULAR | Status: AC
  Filled 2022-03-21: qty 5

## 2022-03-21 MED ORDER — OXYMETAZOLINE HCL 0.05 % NA SOLN
NASAL | Status: DC | PRN
  Administered 2022-03-21: 2 via NASAL

## 2022-03-21 MED ORDER — SUGAMMADEX SODIUM 200 MG/2ML IV SOLN
INTRAVENOUS | Status: DC | PRN
  Administered 2022-03-21: 169.6 mg via INTRAVENOUS

## 2022-03-21 MED ORDER — FENTANYL CITRATE (PF) 100 MCG/2ML IJ SOLN
25.0000 ug | INTRAMUSCULAR | Status: DC | PRN
  Administered 2022-03-21: 50 ug via INTRAVENOUS

## 2022-03-21 MED ORDER — 0.9 % SODIUM CHLORIDE (POUR BTL) OPTIME
TOPICAL | Status: DC | PRN
  Administered 2022-03-21: 100 mL

## 2022-03-21 MED ORDER — OXYCODONE HCL 5 MG PO TABS: 5.0000 mg | ORAL_TABLET | Freq: Once | ORAL | Status: DC | PRN

## 2022-03-21 MED ORDER — DEXAMETHASONE SODIUM PHOSPHATE 10 MG/ML IJ SOLN
INTRAMUSCULAR | Status: DC | PRN
  Administered 2022-03-21: 10 mg via INTRAVENOUS

## 2022-03-21 MED ORDER — LACTATED RINGERS IV SOLN: INTRAVENOUS | Status: DC

## 2022-03-21 MED ORDER — DEXAMETHASONE SODIUM PHOSPHATE 10 MG/ML IJ SOLN
INTRAMUSCULAR | Status: AC
  Filled 2022-03-21: qty 1

## 2022-03-21 MED ORDER — OXYCODONE-ACETAMINOPHEN 5-325 MG PO TABS
1.0000 | ORAL_TABLET | ORAL | 0 refills | Status: DC | PRN
  Filled 2022-03-21: qty 30, 5d supply, fill #0

## 2022-03-21 MED ORDER — ORAL CARE MOUTH RINSE: 15.0000 mL | Freq: Once | OROMUCOSAL | Status: AC

## 2022-03-21 MED ORDER — OXYCODONE-ACETAMINOPHEN 5-325 MG PO TABS: 1.0000 | ORAL_TABLET | Freq: Once | ORAL | Status: DC

## 2022-03-21 MED ORDER — FENTANYL CITRATE (PF) 100 MCG/2ML IJ SOLN
INTRAMUSCULAR | Status: AC
  Filled 2022-03-21: qty 2

## 2022-03-21 MED ORDER — CEFAZOLIN SODIUM-DEXTROSE 2-4 GM/100ML-% IV SOLN
2.0000 g | INTRAVENOUS | Status: AC
  Administered 2022-03-21: 2 g via INTRAVENOUS
  Filled 2022-03-21: qty 100

## 2022-03-21 MED ORDER — FENTANYL CITRATE (PF) 250 MCG/5ML IJ SOLN
INTRAMUSCULAR | Status: DC | PRN
  Administered 2022-03-21: 150 ug via INTRAVENOUS

## 2022-03-21 MED ORDER — OXYMETAZOLINE HCL 0.05 % NA SOLN
NASAL | Status: AC
  Filled 2022-03-21: qty 30

## 2022-03-21 MED ORDER — LIDOCAINE-EPINEPHRINE 2 %-1:100000 IJ SOLN
INTRAMUSCULAR | Status: AC
  Filled 2022-03-21: qty 1

## 2022-03-21 MED ORDER — SODIUM CHLORIDE 0.9 % IR SOLN
Status: DC | PRN
  Administered 2022-03-21: 1000 mL

## 2022-03-21 MED ORDER — PROPOFOL 10 MG/ML IV BOLUS
INTRAVENOUS | Status: AC
  Filled 2022-03-21: qty 20

## 2022-03-21 MED ORDER — AMOXICILLIN 500 MG PO CAPS
500.0000 mg | ORAL_CAPSULE | Freq: Three times a day (TID) | ORAL | 0 refills | Status: DC
  Filled 2022-03-21: qty 21, 7d supply, fill #0

## 2022-03-21 MED ORDER — ROCURONIUM BROMIDE 10 MG/ML (PF) SYRINGE
PREFILLED_SYRINGE | INTRAVENOUS | Status: DC | PRN
  Administered 2022-03-21: 70 mg via INTRAVENOUS

## 2022-03-21 MED ORDER — OXYCODONE HCL 5 MG/5ML PO SOLN: 5.0000 mg | Freq: Once | ORAL | Status: DC | PRN

## 2022-03-21 SURGICAL SUPPLY — 36 items
BAG COUNTER SPONGE SURGICOUNT (BAG) IMPLANT
BLADE SURG 15 STRL LF DISP TIS (BLADE) ×1 IMPLANT
BLADE SURG 15 STRL SS (BLADE) ×1
BUR CROSS CUT FISSURE 1.6 (BURR) ×1 IMPLANT
BUR EGG ELITE 4.0 (BURR) ×1 IMPLANT
CANISTER SUCT 3000ML PPV (MISCELLANEOUS) ×1 IMPLANT
COVER SURGICAL LIGHT HANDLE (MISCELLANEOUS) ×1 IMPLANT
DRAPE U-SHAPE 76X120 STRL (DRAPES) ×1 IMPLANT
GAUZE PACKING FOLDED 2  STR (GAUZE/BANDAGES/DRESSINGS) ×1
GAUZE PACKING FOLDED 2 STR (GAUZE/BANDAGES/DRESSINGS) ×1 IMPLANT
GLOVE BIO SURGEON STRL SZ 6.5 (GLOVE) IMPLANT
GLOVE BIO SURGEON STRL SZ7 (GLOVE) IMPLANT
GLOVE BIO SURGEON STRL SZ8 (GLOVE) ×1 IMPLANT
GLOVE BIOGEL PI IND STRL 6.5 (GLOVE) IMPLANT
GLOVE BIOGEL PI IND STRL 7.0 (GLOVE) IMPLANT
GOWN STRL REUS W/ TWL LRG LVL3 (GOWN DISPOSABLE) ×1 IMPLANT
GOWN STRL REUS W/ TWL XL LVL3 (GOWN DISPOSABLE) ×1 IMPLANT
GOWN STRL REUS W/TWL LRG LVL3 (GOWN DISPOSABLE) ×1
GOWN STRL REUS W/TWL XL LVL3 (GOWN DISPOSABLE) ×1
IV NS 1000ML (IV SOLUTION) ×1
IV NS 1000ML BAXH (IV SOLUTION) ×1 IMPLANT
KIT BASIN OR (CUSTOM PROCEDURE TRAY) ×1 IMPLANT
KIT TURNOVER KIT B (KITS) ×1 IMPLANT
NDL HYPO 25GX1X1/2 BEV (NEEDLE) ×2 IMPLANT
NEEDLE HYPO 25GX1X1/2 BEV (NEEDLE) ×1 IMPLANT
NS IRRIG 1000ML POUR BTL (IV SOLUTION) ×1 IMPLANT
PAD ARMBOARD 7.5X6 YLW CONV (MISCELLANEOUS) ×1 IMPLANT
SLEEVE IRRIGATION ELITE 7 (MISCELLANEOUS) ×1 IMPLANT
SPIKE FLUID TRANSFER (MISCELLANEOUS) ×1 IMPLANT
SPONGE SURGIFOAM ABS GEL 12-7 (HEMOSTASIS) IMPLANT
SUT CHROMIC 3 0 PS 2 (SUTURE) ×1 IMPLANT
SYR BULB IRRIG 60ML STRL (SYRINGE) ×1 IMPLANT
SYR CONTROL 10ML LL (SYRINGE) ×1 IMPLANT
TRAY ENT MC OR (CUSTOM PROCEDURE TRAY) ×1 IMPLANT
TUBING IRRIGATION (MISCELLANEOUS) ×1 IMPLANT
YANKAUER SUCT BULB TIP NO VENT (SUCTIONS) ×1 IMPLANT

## 2022-03-21 NOTE — Anesthesia Preprocedure Evaluation (Addendum)
Anesthesia Evaluation  Patient identified by MRN, date of birth, ID band Patient awake    Reviewed: Allergy & Precautions, NPO status , Patient's Chart, lab work & pertinent test results  Airway Mallampati: II  TM Distance: >3 FB Neck ROM: Full    Dental  (+) Poor Dentition, Dental Advisory Given   Pulmonary neg pulmonary ROS   Pulmonary exam normal breath sounds clear to auscultation       Cardiovascular hypertension, Pt. on medications Normal cardiovascular exam Rhythm:Regular Rate:Normal     Neuro/Psych negative neurological ROS  negative psych ROS   GI/Hepatic ,,,(+)     substance abuse  Hx/o substance abuse in the past Non restorable teeth   Endo/Other  Hyperlipidemia  Renal/GU Renal disease  negative genitourinary   Musculoskeletal  (+) Arthritis , Osteoarthritis,    Abdominal   Peds  Hematology  (+) Blood dyscrasia, anemia   Anesthesia Other Findings   Reproductive/Obstetrics                             Anesthesia Physical Anesthesia Plan  ASA: 3  Anesthesia Plan: General   Post-op Pain Management: Minimal or no pain anticipated   Induction: Intravenous  PONV Risk Score and Plan: 4 or greater and Treatment may vary due to age or medical condition, Midazolam, Ondansetron and Dexamethasone  Airway Management Planned: Nasal ETT  Additional Equipment: None  Intra-op Plan:   Post-operative Plan: Extubation in OR  Informed Consent: I have reviewed the patients History and Physical, chart, labs and discussed the procedure including the risks, benefits and alternatives for the proposed anesthesia with the patient or authorized representative who has indicated his/her understanding and acceptance.     Dental advisory given  Plan Discussed with: CRNA and Anesthesiologist  Anesthesia Plan Comments:        Anesthesia Quick Evaluation

## 2022-03-21 NOTE — Op Note (Signed)
03/21/2022  2:00 PM  PATIENT:  [name]  66 y.o. female  PRE-OPERATIVE DIAGNOSIS:  NON RESTORABLE TEETH NUMBER THREE, FIVE, SIX, SEVEN, EIGHT, NINE, TEN, ELEVEN, TWELVE, TWENTY ONE, TWENTY TWO, TWENTY THREE, TWENTY FOUR, TWENTY FIVE, TWENTY SIX, TWENTY SEVEN, TWENTY EIGHT, TWENTY NINE, THIRTY TWO,  POST-OPERATIVE DIAGNOSIS:  SAME + BILATERAL MANDIBULAR TORI, BILATERAL MAXILLARY BUCCAL EXOSTOSES; TOOTH #12 NOT PRESENT.  PROCEDURE:  Procedure(s): EXTRACTION ALL REMAINING TEETH NUMBER THREE, FIVE, SIX, SEVEN, EIGHT, NINE, TEN, ELEVEN,  TWENTY ONE, TWENTY TWO, TWENTY THREE, TWENTY FOUR, TWENTY FIVE, TWENTY SIX, TWENTY SEVEN, TWENTY EIGHT, TWENTY NINE, THIRTY TWO, ALVEOLOPLASTY, REMOVAL BILATERAL MANDIBULAR LINGUAL TORI, REMOVAL MAXILLARY BUCCAL EXOSTOSES BILATERAL  SURGEON:  Surgeon(s): Diona Browner, DMD  ANESTHESIA:   local and general  EBL:  minimal  DRAINS: none   SPECIMEN:  No Specimen  COUNTS:  YES  PLAN OF CARE: Discharge to home after PACU  PATIENT DISPOSITION:  PACU - hemodynamically stable.   PROCEDURE DETAILS: Dictation # [id number]  [name], DMD 03/21/2022 2:00 PM

## 2022-03-21 NOTE — Op Note (Signed)
[name], [name] MEDICAL RECORD NO: [id number] ACCOUNT NO: [id number] DATE OF BIRTH: November 18, 1955 FACILITY: [facility] LOCATION: [facility] PHYSICIAN: [name], DDS  Operative Report   DATE OF PROCEDURE: 03/21/2022  PREOPERATIVE DIAGNOSIS:  Nonrestorable teeth secondary to dental caries and periodontal disease numbers 3, 5, 6, 7, 8, 9, 10, 11, 12, 21, 22, 23, 24, 25, 26, 27, 28, 29, 32.  POSTOPERATIVE DIAGNOSES:  Nonrestorable teeth secondary to dental caries and periodontal disease numbers 3, 5, 6, 7, 8, 9, 10, 11, 12, 21, 22, 23, 24, 25, 26, 27, 28, 29, 32 plus bilateral mandibular lingual tori, bilateral maxillary buccal exostoses.   Tooth #12 not present.  PROCEDURE:  Extraction all remaining teeth numbers 3, 5, 6, 7, 8, 9, 10, 11 21, 22, 23, 24, 25, 26, 27, 28, 29, 32.  Alveoloplasty right and left maxilla and mandible. Removal of bilateral mandibular buccal exostoses. Removal of bilateral maxillary  buccal exostoses.  SURGEON:  [name], DDS  ANESTHESIA:  General, nasal intubation, Dr. Royce Macadamia attending.  DESCRIPTION OF PROCEDURE:  The patient was taken to the operating room and placed on table in supine position.  General anesthesia was administered and nasal endotracheal tube was placed and secured.  The eyes were protected.  The patient was draped for  surgery.  Timeout was performed.  The posterior pharynx was suctioned and a throat pack was placed.  2% lidocaine 1:100,000 epinephrine was infiltrated in an inferior alveolar block on the right and left sides and in buccal and palatal infiltration in  the maxilla around the teeth to be removed.  A bite block was placed on the right side of the mouth, the left side was operated first.  A 15 blade was used to make an incision in the alveolar crest and the area of tooth #19 carried forward to tooth #24.   It was bifurcated to the buccal gingival sulcus and the lingual gingival sulcus until tooth #27 was encountered.  The  periosteum was reflected.  The teeth were easily removed with the Ash forceps.  The tissue was trimmed.  The sockets were curetted.   The periosteum was reflected to expose the alveolar crest, which was irregular in contour requiring alveoloplasty.  The egg bur under irrigation and Stryker handpiece was used for the alveoplasty and then the bone file was used to further smooth the area  and the lingual aspect, there was a 1 cm x 1 cm lingual exostosis or torus.  This was reduced using the Stryker handpiece with egg bur by recontouring until the lingual plate was smoothed.  Then, the left mandible was irrigated and closed with 3-0  chromic and then the left maxilla was operated.  A 15 blade was used to make an incision in the edentulous space in the area of tooth #14 carried forward along the alveolar crest and then bifurcated to the palatal and buccal gingival sulcus across the  midline until tooth #6 was encountered.  The periosteum was reflected.  There was a large exostosis of the maxilla in the area of tooth #13.  This was reduced using the Stryker handpiece with egg bur to recontour this tissue so that a denture could fit.   Then, teeth numbers 10, 9, 8 and 7 were removed using the dental forceps.  The sockets were curetted.  The tissue was trimmed and then the sockets were curetted and alveoplasty was performed and this area was irrigated and closed with 3-0 chromic.   Then, the bite  block and sweetheart retractor were repositioned to the left side of the mouth so that the right side could be accessed.  The 15 blade was used to make an incision around tooth #32 carried forward to tooth numbers 29, 28 and 27 buccally  and lingually in the mandible.  The periosteum was reflected.  There were large bulbous fibrous appearing areas in the right mandible due to longstanding periodontal disease.  These areas were removed and the tissue was trimmed so that primary closure  could be achieved.  The lingual tori  identified on the right mandible this was reduced using the egg bur and the bone file and then alveoplasty was performed in the right mandible and the area was irrigated and closed with 3-0 chromic. The right maxilla  teeth numbers 3, 5 and 6 were removed using a 15 blade, the Tissue was reflected and the teeth were removed using the dental forceps.  The sockets were curetted and alveoplasty was performed using the egg bur and bone file and an exostosis was removed in  the buccal area of #3.  Then, the oral cavity was then irrigated and suctioned and a throat pack was removed.  The patient was left under care of anesthesia for extubation and transport to recovery room with plans for discharge home through day surgery.  ESTIMATED BLOOD LOSS:  Minimal.  COMPLICATIONS:  None.  SPECIMENS:  None.   PUS D: 03/21/2022 2:06:00 pm T: 03/21/2022 3:00:00 pm  JOB: [id number]/ [id number]

## 2022-03-21 NOTE — H&P (Signed)
H&P documentation  -History and Physical Reviewed  -Patient has been re-examined  -No change in the plan of care  [name]  

## 2022-03-21 NOTE — Anesthesia Postprocedure Evaluation (Signed)
Anesthesia Post Note  Patient: [name]  Procedure(s) Performed: EXTRACTION ALL REMAINING TEETH NUMBER THREE, FIVE, SIX, SEVEN, EIGHT, NINE, TEN, ELEVEN, TWELVE, TWENTY ONE, TWENTY TWO, TWENTY THREE, TWENTY FOUR, TWENTY FIVE, TWENTY SIX, TWENTY SEVEN, TWENTY EIGHT, TWENTY NINE, THIRTY TWO, ALVEO LOPLASTY (Mouth)     Patient location during evaluation: PACU Anesthesia Type: General Level of consciousness: awake and alert and oriented Pain management: pain level controlled Vital Signs Assessment: post-procedure vital signs reviewed and stable Respiratory status: spontaneous breathing, nonlabored ventilation, respiratory function stable and patient connected to nasal cannula oxygen Cardiovascular status: blood pressure returned to baseline and stable Postop Assessment: no apparent nausea or vomiting Anesthetic complications: no   No notable events documented.  Last Vitals:  Vitals:   03/21/22 1500 03/21/22 1515  BP: (!) 144/90 (!) 139/94  Pulse: 72 71  Resp: 13 12  Temp:    SpO2: 93% 94%    Last Pain:  Vitals:   03/21/22 1515  TempSrc:   PainSc: 1                  [name] A.

## 2022-03-21 NOTE — H&P (Signed)
Anesthesia H&P Update: History and Physical Exam reviewed; patient is OK for planned anesthetic and procedure. ? ?

## 2022-03-21 NOTE — Transfer of Care (Signed)
Immediate Anesthesia Transfer of Care Note  Patient: [name]  Procedure(s) Performed: EXTRACTION ALL REMAINING TEETH NUMBER THREE, FIVE, SIX, SEVEN, EIGHT, NINE, TEN, ELEVEN, TWELVE, TWENTY ONE, TWENTY TWO, TWENTY THREE, TWENTY FOUR, TWENTY FIVE, TWENTY SIX, TWENTY SEVEN, TWENTY EIGHT, TWENTY NINE, THIRTY TWO, ALVEO LOPLASTY (Mouth)  Patient Location: PACU  Anesthesia Type:General  Level of Consciousness: awake, alert , and oriented  Airway & Oxygen Therapy: Patient Spontanous Breathing and Patient connected to nasal cannula oxygen  Post-op Assessment: Report given to RN, Post -op Vital signs reviewed and stable, Patient moving all extremities X 4, and Patient able to stick tongue midline  Post vital signs: Reviewed  Last Vitals:  Vitals Value Taken Time  BP 171/94   Temp 97.8   Pulse 90 03/21/22 1417  Resp 13 03/21/22 1417  SpO2 100 % 03/21/22 1417  Vitals shown include unvalidated device data.  Last Pain:  Vitals:   03/21/22 1042  TempSrc:   PainSc: 0-No pain         Complications: No notable events documented.

## 2022-03-21 NOTE — Anesthesia Procedure Notes (Signed)
Procedure Name: Intubation Date/Time: 03/21/2022 12:57 PM  Performed by: Maude Leriche, CRNAPre-anesthesia Checklist: Patient identified, Emergency Drugs available, Suction available and Patient being monitored Patient Re-evaluated:Patient Re-evaluated prior to induction Oxygen Delivery Method: Circle system utilized Preoxygenation: Pre-oxygenation with 100% oxygen Induction Type: IV induction Ventilation: Mask ventilation without difficulty Laryngoscope Size: Miller and 2 Grade View: Grade I Nasal Tubes: Nasal prep performed, Nasal Rae and Magill forceps- large, utilized Tube size: 7.0 mm Number of attempts: 1 Placement Confirmation: ETT inserted through vocal cords under direct vision, positive ETCO2 and breath sounds checked- equal and bilateral Secured at: 26 cm Tube secured with: Tape Dental Injury: Teeth and Oropharynx as per pre-operative assessment

## 2022-03-22 ENCOUNTER — Encounter (HOSPITAL_COMMUNITY): Payer: Self-pay | Admitting: Oral Surgery

## 2022-03-28 ENCOUNTER — Other Ambulatory Visit: Payer: Self-pay

## 2022-03-29 ENCOUNTER — Ambulatory Visit (INDEPENDENT_AMBULATORY_CARE_PROVIDER_SITE_OTHER): Payer: Medicare Other | Admitting: Primary Care

## 2022-03-30 ENCOUNTER — Other Ambulatory Visit: Payer: Self-pay

## 2022-04-13 ENCOUNTER — Ambulatory Visit (INDEPENDENT_AMBULATORY_CARE_PROVIDER_SITE_OTHER): Payer: 59 | Admitting: Primary Care

## 2022-04-13 ENCOUNTER — Encounter (INDEPENDENT_AMBULATORY_CARE_PROVIDER_SITE_OTHER): Payer: Self-pay | Admitting: Primary Care

## 2022-04-13 ENCOUNTER — Other Ambulatory Visit: Payer: Self-pay

## 2022-04-13 VITALS — BP 128/86 | HR 65 | Ht 66.5 in | Wt 183.2 lb

## 2022-04-13 DIAGNOSIS — K0500 Acute gingivitis, plaque induced: Secondary | ICD-10-CM

## 2022-04-13 DIAGNOSIS — I10 Essential (primary) hypertension: Secondary | ICD-10-CM

## 2022-04-13 DIAGNOSIS — Z760 Encounter for issue of repeat prescription: Secondary | ICD-10-CM

## 2022-04-13 DIAGNOSIS — E2839 Other primary ovarian failure: Secondary | ICD-10-CM

## 2022-04-13 DIAGNOSIS — R7303 Prediabetes: Secondary | ICD-10-CM | POA: Diagnosis not present

## 2022-04-13 MED ORDER — CHLORHEXIDINE GLUCONATE 0.12 % MT SOLN
OROMUCOSAL | 1 refills | Status: DC
  Filled 2022-04-13: qty 473, 25d supply, fill #0

## 2022-04-13 MED ORDER — HYDROCHLOROTHIAZIDE 25 MG PO TABS
ORAL_TABLET | Freq: Every day | ORAL | 1 refills | Status: DC
  Filled 2022-04-13: qty 90, fill #0

## 2022-04-13 MED ORDER — HYDROCHLOROTHIAZIDE 25 MG PO TABS
ORAL_TABLET | Freq: Every day | ORAL | 1 refills | Status: DC
  Filled 2022-04-13: qty 90, 90d supply, fill #0

## 2022-04-13 NOTE — Progress Notes (Signed)
[name] is a 67 y.o. female presents for hypertension evaluation, Denies shortness of breath, headaches, chest pain or lower extremity edema, sudden onset, vision changes, unilateral weakness, dizziness, paresthesias   Patient reports adherence with medications.  Dietary habits include: monitoring sodium  Exercise habits include:walking  Family / Social history: No   Past Medical History:  Diagnosis Date   Allergy    allergy , sinus issues   Anemia    when younger   Arthritis    HANDS,KNEES,"LITTLE BIT"   Hx of blood clots 04/04/1991   previously on Coumadin x 3 months in the past    Hyperlipidemia    on meds   Hypertension    on meds   Positive colorectal cancer screening using Cologuard test 03/02/2021   Past Surgical History:  Procedure Laterality Date   COLONOSCOPY     POLYPECTOMY     TOOTH EXTRACTION N/A 03/21/2022   Procedure: EXTRACTION ALL REMAINING TEETH NUMBER THREE, FIVE, SIX, SEVEN, EIGHT, NINE, TEN, ELEVEN, TWELVE, TWENTY ONE, TWENTY TWO, TWENTY THREE, TWENTY FOUR, TWENTY FIVE, TWENTY SIX, TWENTY SEVEN, TWENTY EIGHT, TWENTY NINE, THIRTY TWO, ALVEO LOPLASTY;  Surgeon: Diona Browner, DMD;  Location: [facility];  Service: Oral Surgery;  Laterality: N/A;   tooth pulled with sedation     many years ago   Allergies  Allergen Reactions   Lisinopril     Suspect ACE inhibitor allergy as patient creatinine nearly doubled after initiation of lisinopril   Current Outpatient Medications on File Prior to Visit  Medication Sig Dispense Refill   ALOE VERA PO Take 1 Dose by mouth daily.     amoxicillin (AMOXIL) 500 MG capsule Take 1 capsule (500 mg total) by mouth 3 (three) times daily. 21 capsule 0   Apoaequorin (PREVAGEN PO) Take 1 capsule by mouth 2 (two) times a week.     Ascorbic Acid (VITAMIN C PO) Take 1 tablet by mouth daily.     COENZYME Q10 PO Take 1 capsule by mouth daily.     Cyanocobalamin (VITAMIN B 12 PO) Take by mouth  daily.     hydrochlorothiazide (HYDRODIURIL) 25 MG tablet TAKE 1 TABLET (25 MG TOTAL) BY MOUTH DAILY. 90 tablet 1   ibuprofen (ADVIL) 600 MG tablet Take 1 tablet (600 mg total) by mouth every 8 (eight) hours as needed. 60 tablet 1   loratadine (CLARITIN) 10 MG tablet Take 1 tablet (10 mg total) by mouth daily. 30 tablet 11   naproxen sodium (ALEVE) 220 MG tablet Take 220 mg by mouth daily as needed (pain.).     Omega-3 Fatty Acids (OMEGA 3 PO) Take 1 capsule by mouth daily.     rosuvastatin (CRESTOR) 40 MG tablet Take 1 tablet (40 mg total) by mouth daily. 90 tablet 3   VITAMIN D PO Take 1 tablet by mouth daily.     chlorhexidine (PERIDEX) 0.12 % solution Use as directed 15 mLs in the mouth or throat 2 (two) times daily. (Patient not taking: Reported on 03/20/2022) 473 mL 0   CHLORHEXIDINE GLUCONATE, BULK, SOLN Use a cap full and gargle after brushing teeth twice daily (Patient not taking: Reported on 03/20/2022) 4000 mL 1   fluorometholone (FML) 0.1 % ophthalmic suspension Place 1 drop into the right eye 4 (four) times daily. (Patient not taking: Reported on 04/13/2022) 5 mL 0   fluticasone (FLONASE) 50 MCG/ACT nasal spray PLACE 2 SPRAYS INTO BOTH NOSTRILS DAILY. (Patient not taking: Reported on 11/04/2021)  16 g 2   oxyCODONE-acetaminophen (PERCOCET) 5-325 MG tablet Take 1 tablet by mouth every 4 (four) hours as needed. (Patient not taking: Reported on 04/13/2022) 30 tablet 0   No current facility-administered medications on file prior to visit.   Social History   Socioeconomic History   Marital status: Married    Spouse name: Not on file   Number of children: Not on file   Years of education: Not on file   Highest education level: Not on file  Occupational History   Not on file  Tobacco Use   Smoking status: Never    Passive exposure: Current   Smokeless tobacco: Never  Vaping Use   Vaping Use: Never used  Substance and Sexual Activity   Alcohol use: Yes    Alcohol/week: 2.0 standard  drinks of alcohol    Types: 2 Cans of beer per week    Comment: occasionally   Drug use: No   Sexual activity: Not Currently  Other Topics Concern   Not on file  Social History Narrative   Not on file   Social Determinants of Health   Financial Resource Strain: Not on file  Food Insecurity: Not on file  Transportation Needs: Not on file  Physical Activity: Not on file  Stress: Not on file  Social Connections: Not on file  Intimate Partner Violence: Not on file   Family History  Problem Relation Age of Onset   Breast cancer Neg Hx    Colon cancer Neg Hx    Colon polyps Neg Hx    Esophageal cancer Neg Hx    Rectal cancer Neg Hx    Stomach cancer Neg Hx    Crohn's disease Neg Hx      OBJECTIVE:  Vitals:   04/13/22 1543  BP: 128/86  Pulse: 65  SpO2: 98%  Weight: 183 lb 3.2 oz (83.1 kg)  Height: 5' 6.5" (1.689 m)    Physical Exam Vitals reviewed.  Constitutional:      Appearance: Normal appearance.     Comments: Overweight   HENT:     Head: Normocephalic.     Right Ear: Tympanic membrane and external ear normal.     Left Ear: Tympanic membrane and external ear normal.     Nose: Nose normal.     Mouth/Throat:     Comments: Teeth all remove Eyes:     Extraocular Movements: Extraocular movements intact.     Pupils: Pupils are equal, round, and reactive to light.  Cardiovascular:     Rate and Rhythm: Normal rate and regular rhythm.  Pulmonary:     Effort: Pulmonary effort is normal.     Breath sounds: Normal breath sounds.  Abdominal:     General: Bowel sounds are normal. There is distension.     Palpations: Abdomen is soft.  Musculoskeletal:        General: Normal range of motion.     Cervical back: Normal range of motion and neck supple.  Skin:    General: Skin is warm and dry.  Neurological:     Mental Status: She is alert and oriented to person, place, and time.  Psychiatric:        Mood and Affect: Mood normal.        Behavior: Behavior normal.       ROS Comprehensive ROS Pertinent positive and negative noted in HPI   Last 3 Office BP readings: BP Readings from Last 3 Encounters:  04/13/22 128/86  03/21/22 (Abnormal) 139/94  11/25/21 (Abnormal) 147/92    BMET    Component Value Date/Time   NA 140 03/21/2022 1113   NA 141 10/28/2021 0830   K 3.3 (L) 03/21/2022 1113   CL 103 03/21/2022 1113   CO2 27 03/21/2022 1113   GLUCOSE 104 (H) 03/21/2022 1113   BUN 16 03/21/2022 1113   BUN 13 10/28/2021 0830   CREATININE 0.77 03/21/2022 1113   CREATININE 1.15 (H) 07/01/2012 1558   CALCIUM 9.0 03/21/2022 1113   GFRNONAA >60 03/21/2022 1113   GFRAA 102 04/30/2020 0859    Renal function: CrCl cannot be calculated (Patient's most recent lab result is older than the maximum 21 days allowed.).  Clinical ASCVD: Yes  The 10-year ASCVD risk score (Arnett DK, et al., 2019) is: 27.8%   Values used to calculate the score:     Age: 19 years     Sex: Female     Is Non-Hispanic African American: Yes     Diabetic: Yes     Tobacco smoker: No     Systolic Blood Pressure: 092 mmHg     Is BP treated: Yes     HDL Cholesterol: 48 mg/dL     Total Cholesterol: 265 mg/dL  ASCVD risk factors include- [place]   ASSESSMENT & PLAN: [name] was seen today for hypertension.  Diagnoses and all orders for this visit:  Decreased estrogen level -     DG Bone Density; Future  Medication refill -     Discontinue: hydrochlorothiazide (HYDRODIURIL) 25 MG tablet; TAKE 1 TABLET (25 MG TOTAL) BY MOUTH DAILY. -     hydrochlorothiazide (HYDRODIURIL) 25 MG tablet; TAKE 1 TABLET (25 MG TOTAL) BY MOUTH DAILY.  -     Discontinue: hydrochlorothiazide (HYDRODIURIL) 25 MG tablet; TAKE 1 TABLET (25 MG TOTAL) BY MOUTH DAILY. -     hydrochlorothiazide (HYDRODIURIL) 25 MG tablet; TAKE 1 TABLET (25 MG TOTAL) BY MOUTH DAILY.  Prediabetes - educated on lifestyle modifications, including but not limited to diet choices and adding exercise to daily routine.   -      Microalbumin / creatinine urine ratio  Acute gingivitis -     chlorhexidine (PERIDEX) 0.12 % solution; Use a cap full and gargle after brushing teeth twice daily  Essential hypertension -Counseled on lifestyle modifications for blood pressure control including reduced dietary sodium, increased exercise, weight reduction and adequate sleep. Also, educated patient about the risk for cardiovascular events, stroke and heart attack. Also counseled patient about the importance of medication adherence. If you participate in smoking, it is important to stop using tobacco as this will increase the risks associated with uncontrolled blood pressure.  Minimize salt intake. Minimize alcohol intake    This note has been created with Surveyor, quantity. Any transcriptional errors are unintentional.   Kerin Perna, NP 04/13/2022, 4:01 PM

## 2022-05-09 ENCOUNTER — Other Ambulatory Visit (INDEPENDENT_AMBULATORY_CARE_PROVIDER_SITE_OTHER): Payer: Self-pay | Admitting: Primary Care

## 2022-05-09 ENCOUNTER — Other Ambulatory Visit: Payer: Self-pay

## 2022-05-09 DIAGNOSIS — Z760 Encounter for issue of repeat prescription: Secondary | ICD-10-CM

## 2022-05-09 DIAGNOSIS — I10 Essential (primary) hypertension: Secondary | ICD-10-CM

## 2022-05-09 MED ORDER — PRAVASTATIN SODIUM 40 MG PO TABS
40.0000 mg | ORAL_TABLET | Freq: Every day | ORAL | 1 refills | Status: DC
  Filled 2022-05-09: qty 90, 90d supply, fill #0
  Filled 2022-08-02: qty 90, 90d supply, fill #1

## 2022-05-10 ENCOUNTER — Other Ambulatory Visit: Payer: Self-pay

## 2022-05-12 ENCOUNTER — Ambulatory Visit
Admission: RE | Admit: 2022-05-12 | Discharge: 2022-05-12 | Disposition: A | Discharge status: Home / Self Care | Payer: 59 | Source: Ambulatory Visit | Attending: Primary Care | Admitting: Primary Care

## 2022-05-12 DIAGNOSIS — Z1231 Encounter for screening mammogram for malignant neoplasm of breast: Secondary | ICD-10-CM

## 2022-05-21 ENCOUNTER — Other Ambulatory Visit (INDEPENDENT_AMBULATORY_CARE_PROVIDER_SITE_OTHER): Payer: Self-pay | Admitting: Primary Care

## 2022-05-21 DIAGNOSIS — Z760 Encounter for issue of repeat prescription: Secondary | ICD-10-CM

## 2022-05-21 DIAGNOSIS — I10 Essential (primary) hypertension: Secondary | ICD-10-CM

## 2022-05-22 NOTE — Telephone Encounter (Signed)
Rx written 04/13/22 #90 1RF- can cover next RF with #100 only Requested Prescriptions  Pending Prescriptions Disp Refills   hydrochlorothiazide (HYDRODIURIL) 25 MG tablet [Pharmacy Med Name: hydroCHLOROthiazide 25 MG Oral Tablet] 100 tablet 0    Sig: TAKE 1 TABLET BY MOUTH DAILY     Cardiovascular: Diuretics - Thiazide Failed - 05/21/2022  8:33 AM      Failed - K in normal range and within 180 days    Potassium  Date Value Ref Range Status  03/21/2022 3.3 (L) 3.5 - 5.1 mmol/L Final         Passed - Cr in normal range and within 180 days    Creat  Date Value Ref Range Status  07/01/2012 1.15 (H) 0.50 - 1.10 mg/dL Final   Creatinine, Ser  Date Value Ref Range Status  03/21/2022 0.77 0.44 - 1.00 mg/dL Final         Passed - Na in normal range and within 180 days    Sodium  Date Value Ref Range Status  03/21/2022 140 135 - 145 mmol/L Final  10/28/2021 141 134 - 144 mmol/L Final         Passed - Last BP in normal range    BP Readings from Last 1 Encounters:  04/13/22 128/86         Passed - Valid encounter within last 6 months    Recent Outpatient Visits           1 month ago Decreased estrogen level   [place], [place], NP   3 months ago Dental caries   [place], [place], NP   6 months ago Prediabetes   [place], Michelle P, NP   7 months ago History of colonic polyps   [place], Michelle P, NP   1 year ago Colon cancer screening   [place], [place], NP       Future Appointments             In 4 months Oletta Lamas, [place] Cage, NP [place]

## 2022-05-25 ENCOUNTER — Encounter (INDEPENDENT_AMBULATORY_CARE_PROVIDER_SITE_OTHER): Payer: Self-pay | Admitting: Primary Care

## 2022-05-25 ENCOUNTER — Other Ambulatory Visit: Payer: Self-pay

## 2022-05-25 ENCOUNTER — Ambulatory Visit (INDEPENDENT_AMBULATORY_CARE_PROVIDER_SITE_OTHER): Payer: 59 | Admitting: Primary Care

## 2022-05-25 ENCOUNTER — Encounter (INDEPENDENT_AMBULATORY_CARE_PROVIDER_SITE_OTHER): Payer: Self-pay

## 2022-05-25 ENCOUNTER — Telehealth (INDEPENDENT_AMBULATORY_CARE_PROVIDER_SITE_OTHER): Payer: Self-pay | Admitting: Primary Care

## 2022-05-25 VITALS — BP 117/79 | HR 69 | Resp 16

## 2022-05-25 DIAGNOSIS — J069 Acute upper respiratory infection, unspecified: Secondary | ICD-10-CM | POA: Diagnosis not present

## 2022-05-25 DIAGNOSIS — R059 Cough, unspecified: Secondary | ICD-10-CM

## 2022-05-25 MED ORDER — TUSSIN COUGH 15 MG/5ML PO SYRP
10.0000 mL | ORAL_SOLUTION | Freq: Four times a day (QID) | ORAL | 0 refills | Status: DC | PRN
  Filled 2022-05-25: qty 120, 3d supply, fill #0

## 2022-05-25 NOTE — Telephone Encounter (Signed)
Provider has sent rx in to pharmacy

## 2022-05-25 NOTE — Telephone Encounter (Signed)
Pt went to pharmacy to pick up cough medicine but nothing was at the location for her / please advise and send to  [place]

## 2022-05-25 NOTE — Progress Notes (Signed)
    [facility]  Subjective:     [name] is a 67 y.o. female who presents for evaluation of chills, nasal congestion, productive cough, sore throat, and sweats. Symptoms began 4 days ago. Symptoms have been gradually worsening since that time. Past history is significant for nothing.  The following portions of the patient's history were reviewed and updated as appropriate: allergies, current medications, and problem list. Past Medical History:  Diagnosis Date   Allergy    allergy , sinus issues   Anemia    when younger   Arthritis    HANDS,KNEES,"LITTLE BIT"   Hx of blood clots 04/04/1991   previously on Coumadin x 3 months in the past    Hyperlipidemia    on meds   Hypertension    on meds   Positive colorectal cancer screening using Cologuard test 03/02/2021    Review of Systems Pertinent items noted in HPI and remainder of comprehensive ROS otherwise negative.    Objective:  Blood Pressure 117/79   Pulse 69   Respiration 16   Oxygen Saturation 97%     Blood Pressure 117/79   Pulse 69   Respiration 16   Oxygen Saturation 97%  General appearance: alert and appears stated age Head: Normocephalic, without obvious abnormality, atraumatic Nose: Nares normal. Septum midline. Mucosa normal. No drainage or sinus tenderness., turbinates red, swollen, edematous Throat: normal findings: soft palate, uvula, and tonsils normal Neck: no adenopathy, no carotid bruit, no JVD, supple, symmetrical, trachea midline, and thyroid not enlarged, symmetric, no tenderness/mass/nodules Lungs: clear to auscultation bilaterally Heart: regular rate and rhythm, S1, S2 normal, no murmur, click, rub or gallop Abdomen: soft, non-tender; bowel sounds normal; no masses,  no organomegaly Skin: Skin color, texture, turgor normal. No rashes or lesions Lymph nodes: Cervical, supraclavicular, and axillary nodes normal.    Assessment:  [name] was seen today for cough and sinus  problem.  Diagnoses and all orders for this visit:  Acute URI -     Respiratory Panel w/ SARS-CoV2  Cough, unspecified type -     dextromethorphan (TUSSIN COUGH) 15 MG/5ML syrup; Take 10 mLs (30 mg total) by mouth 4 (four) times daily as needed for cough.    Worsening signs and symptoms discussed. Rest, fluids, acetaminophen, and humidification. Follow up as needed for persistent, worsening cough, or appearance of new symptoms.  This note has been created with Surveyor, quantity. Any transcriptional errors are unintentional.   Kerin Perna, NP 05/25/2022, 2:14 PM

## 2022-05-30 LAB — RESPIRATORY PANEL W/ SARS-COV2

## 2022-05-31 ENCOUNTER — Other Ambulatory Visit: Payer: Self-pay

## 2022-06-05 LAB — RESPIRATORY PANEL W/ SARS-COV2

## 2022-07-01 ENCOUNTER — Other Ambulatory Visit: Payer: Self-pay

## 2022-07-01 ENCOUNTER — Emergency Department (HOSPITAL_COMMUNITY)
Admission: EM | Admit: 2022-07-01 | Discharge: 2022-07-01 | Disposition: A | Discharge status: Home / Self Care | Payer: 59 | Attending: Emergency Medicine | Admitting: Emergency Medicine

## 2022-07-01 ENCOUNTER — Emergency Department (HOSPITAL_COMMUNITY): Payer: 59

## 2022-07-01 DIAGNOSIS — E876 Hypokalemia: Secondary | ICD-10-CM | POA: Diagnosis not present

## 2022-07-01 DIAGNOSIS — I10 Essential (primary) hypertension: Secondary | ICD-10-CM | POA: Diagnosis not present

## 2022-07-01 DIAGNOSIS — R55 Syncope and collapse: Secondary | ICD-10-CM | POA: Insufficient documentation

## 2022-07-01 DIAGNOSIS — Z79899 Other long term (current) drug therapy: Secondary | ICD-10-CM | POA: Diagnosis not present

## 2022-07-01 DIAGNOSIS — D72819 Decreased white blood cell count, unspecified: Secondary | ICD-10-CM | POA: Diagnosis not present

## 2022-07-01 LAB — CBC WITH DIFFERENTIAL/PLATELET
Abs Immature Granulocytes: 0.01 10*3/uL (ref 0.00–0.07)
Basophils Absolute: 0.1 10*3/uL (ref 0.0–0.1)
Basophils Relative: 2 %
Eosinophils Absolute: 0.1 10*3/uL (ref 0.0–0.5)
Eosinophils Relative: 4 %
HCT: 39.2 % (ref 36.0–46.0)
Hemoglobin: 13.5 g/dL (ref 12.0–15.0)
Immature Granulocytes: 0 %
Lymphocytes Relative: 52 %
Lymphs Abs: 1.8 10*3/uL (ref 0.7–4.0)
MCH: 30.7 pg (ref 26.0–34.0)
MCHC: 34.4 g/dL (ref 30.0–36.0)
MCV: 89.1 fL (ref 80.0–100.0)
Monocytes Absolute: 0.3 10*3/uL (ref 0.1–1.0)
Monocytes Relative: 8 %
Neutro Abs: 1.1 10*3/uL — ABNORMAL LOW (ref 1.7–7.7)
Neutrophils Relative %: 34 %
Platelets: 198 10*3/uL (ref 150–400)
RBC: 4.4 MIL/uL (ref 3.87–5.11)
RDW: 13.9 % (ref 11.5–15.5)
WBC: 3.4 10*3/uL — ABNORMAL LOW (ref 4.0–10.5)
nRBC: 0 % (ref 0.0–0.2)

## 2022-07-01 LAB — BASIC METABOLIC PANEL
Anion gap: 15 (ref 5–15)
BUN: 11 mg/dL (ref 8–23)
CO2: 24 mmol/L (ref 22–32)
Calcium: 9.3 mg/dL (ref 8.9–10.3)
Chloride: 103 mmol/L (ref 98–111)
Creatinine, Ser: 0.92 mg/dL (ref 0.44–1.00)
GFR, Estimated: >60 mL/min (ref >60)
Glucose, Bld: 98 mg/dL (ref 70–99)
Potassium: 3.2 mmol/L — ABNORMAL LOW (ref 3.5–5.1)
Sodium: 142 mmol/L (ref 135–145)

## 2022-07-01 LAB — TROPONIN I (HIGH SENSITIVITY)
Troponin I (High Sensitivity): 7 ng/L (ref <18)
Troponin I (High Sensitivity): 7 ng/L (ref <18)

## 2022-07-01 MED ORDER — KETOROLAC TROMETHAMINE 15 MG/ML IJ SOLN
15.0000 mg | Freq: Once | INTRAMUSCULAR | Status: AC
  Administered 2022-07-01: 15 mg via INTRAVENOUS
  Filled 2022-07-01: qty 1

## 2022-07-01 MED ORDER — LACTATED RINGERS IV BOLUS
500.0000 mL | Freq: Once | INTRAVENOUS | Status: AC
  Administered 2022-07-01: 500 mL via INTRAVENOUS

## 2022-07-01 MED ORDER — POTASSIUM CHLORIDE CRYS ER 20 MEQ PO TBCR
40.0000 meq | EXTENDED_RELEASE_TABLET | Freq: Once | ORAL | Status: AC
  Administered 2022-07-01: 40 meq via ORAL
  Filled 2022-07-01: qty 2

## 2022-07-01 MED ORDER — POTASSIUM CHLORIDE CRYS ER 20 MEQ PO TBCR
20.0000 meq | EXTENDED_RELEASE_TABLET | Freq: Every day | ORAL | 3 refills | Status: DC
  Filled 2022-07-01: qty 30, 30d supply, fill #0

## 2022-07-01 MED ORDER — TETRACAINE HCL 0.5 % OP SOLN
1.0000 [drp] | Freq: Once | OPHTHALMIC | Status: AC
  Administered 2022-07-01: 1 [drp] via OPHTHALMIC
  Filled 2022-07-01: qty 4

## 2022-07-01 MED ORDER — DIPHENHYDRAMINE HCL 50 MG/ML IJ SOLN
12.5000 mg | Freq: Once | INTRAMUSCULAR | Status: AC
  Administered 2022-07-01: 12.5 mg via INTRAVENOUS
  Filled 2022-07-01: qty 1

## 2022-07-01 MED ORDER — METOCLOPRAMIDE HCL 5 MG/ML IJ SOLN
10.0000 mg | Freq: Once | INTRAMUSCULAR | Status: AC
  Administered 2022-07-01: 10 mg via INTRAVENOUS
  Filled 2022-07-01: qty 2
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
## 2022-07-01 NOTE — ED Provider Notes (Signed)
[facility] Provider Note   CSN: [id number] Arrival date & time: 07/01/22  [id number]     History  Chief Complaint  Patient presents with   Fall    Fall w/ positive LOC    [name] is a 67 y.o. female.  The history is provided by the patient and the spouse.  Fall  She has history of hypertension, hyperlipidemia DVT but not currently on any anticoagulants and is brought in by ambulance after an apparent syncopal episode at home.  She remembers going to the bathroom and the next thing she remembers is being in the ambulance.  Her husband states that he heard her fall and found her in the bathroom and states that loss of consciousness was for about 1 minute.  There is no seizure activity noted.  Patient denies complaints currently other than she wants her cervical collar off.  She denies chest pain, heaviness, tightness, pressure.  She denies any palpitations.   Home Medications Prior to Admission medications   Medication Sig Start Date End Date Taking? Authorizing Provider  ALOE VERA PO Take 1 Dose by mouth daily.    [provider]  amoxicillin (AMOXIL) 500 MG capsule Take 1 capsule (500 mg total) by mouth 3 (three) times daily. Patient not taking: Reported on 05/25/2022 03/21/22   Diona Browner, DMD  Apoaequorin (PREVAGEN PO) Take 1 capsule by mouth 2 (two) times a week.    [provider]  Ascorbic Acid (VITAMIN C PO) Take 1 tablet by mouth daily.    [provider]  chlorhexidine (PERIDEX) 0.12 % solution Use as directed 15 mLs in the mouth or throat 2 (two) times daily. Patient not taking: Reported on 03/20/2022 09/29/21   Charlott Rakes, MD  chlorhexidine (PERIDEX) 0.12 % solution Use a cap full and gargle after brushing teeth twice daily 04/13/22   Kerin Perna, NP  COENZYME Q10 PO Take 1 capsule by mouth daily.    [provider]  Cyanocobalamin (VITAMIN B 12 PO) Take by mouth daily.     [provider]  dextromethorphan (TUSSIN COUGH) 15 MG/5ML syrup Take 10 mLs (30 mg total) by mouth 4 (four) times daily as needed for cough. 05/25/22   Kerin Perna, NP  fluorometholone (FML) 0.1 % ophthalmic suspension Place 1 drop into the right eye 4 (four) times daily. Patient not taking: Reported on 04/13/2022 10/31/21     fluticasone (FLONASE) 50 MCG/ACT nasal spray PLACE 2 SPRAYS INTO BOTH NOSTRILS DAILY. Patient not taking: Reported on 11/04/2021 09/29/21 09/29/22  Kerin Perna, NP  hydrochlorothiazide (HYDRODIURIL) 25 MG tablet TAKE 1 TABLET BY MOUTH DAILY 05/22/22   Kerin Perna, NP  ibuprofen (ADVIL) 600 MG tablet Take 1 tablet (600 mg total) by mouth every 8 (eight) hours as needed. 01/30/22   Kerin Perna, NP  loratadine (CLARITIN) 10 MG tablet Take 1 tablet (10 mg total) by mouth daily. 09/29/21   Kerin Perna, NP  naproxen sodium (ALEVE) 220 MG tablet Take 220 mg by mouth daily as needed (pain.).    [provider]  Omega-3 Fatty Acids (OMEGA 3 PO) Take 1 capsule by mouth daily.    [provider]  oxyCODONE-acetaminophen (PERCOCET) 5-325 MG tablet Take 1 tablet by mouth every 4 (four) hours as needed. Patient not taking: Reported on 04/13/2022 03/21/22   Diona Browner, DMD  pravastatin (PRAVACHOL) 40 MG tablet Take 1 tablet (40 mg total) by mouth  daily. 05/09/22   Kerin Perna, NP  VITAMIN D PO Take 1 tablet by mouth daily.    [provider]  Marny Lowenstein, BULK, SOLN Use a cap full and gargle after brushing teeth twice daily Patient not taking: Reported on 03/20/2022 09/29/21 04/13/22  Kerin Perna, NP      Allergies    Lisinopril    Review of Systems   Review of Systems  All other systems reviewed and are negative.   Physical Exam Updated Vital Signs BP (!) 142/73 (BP Location: Right Arm)   Pulse 70   Temp 98.2 F (36.8 C)   Resp 16   Ht 5' (1.524 m)   Wt 83.4 kg   SpO2 100%   BMI  35.91 kg/m  Physical Exam Vitals and nursing note reviewed.   67 year old female, resting comfortably and in no acute distress. Vital signs are significant for borderline elevated blood pressure. Oxygen saturation is 100%, which is normal. Head is normocephalic and atraumatic. PERRLA, EOMI. Oropharynx is clear. Neck is nontender and supple without adenopathy or JVD.  There are no carotid bruits. Back is nontender and there is no CVA tenderness. Lungs are clear without rales, wheezes, or rhonchi. Chest is nontender. Heart has regular rate and rhythm without murmur. Abdomen is soft, flat, nontender. Extremities have no cyanosis or edema, full range of motion is present. Skin is warm and dry without rash. Neurologic: Awake and alert, oriented x 3 but slow to answer questions about orientation, speech is otherwise fluent, cranial nerves are intact, strength is 5/5 in all 4 extremities, no sensory deficits.  ED Results / Procedures / Treatments   Labs (all labs ordered are listed, but only abnormal results are displayed) Labs Reviewed  CBC WITH DIFFERENTIAL/PLATELET - Abnormal; Notable for the following components:      Result Value   WBC 3.4 (*)    Neutro Abs 1.1 (*)    All other components within normal limits  BASIC METABOLIC PANEL - Abnormal; Notable for the following components:   Potassium 3.2 (*)    All other components within normal limits  TROPONIN I (HIGH SENSITIVITY)  TROPONIN I (HIGH SENSITIVITY)    EKG EKG Interpretation  Date/Time:  Saturday July 01 2022 04:59:00 EDT Ventricular Rate:  71 PR Interval:  188 QRS Duration: 102 QT Interval:  421 QTC Calculation: 458 R Axis:   -55 Text Interpretation: Sinus rhythm LAD, consider left anterior fascicular block Borderline T abnormalities, inferior leads When compared with ECG of 03/21/2022, No significant change was found Confirmed by Delora Fuel ([id number]) on 07/01/2022 5:15:17 AM  Radiology No results  found.  Procedures Procedures  Cardiac monitor shows normal sinus rhythm, per my interpretation.  Medications Ordered in ED Medications  potassium chloride SA (KLOR-CON M) CR tablet 40 mEq (has no administration in time range)    ED Course/ Medical Decision Making/ A&P                             Medical Decision Making Amount and/or Complexity of Data Reviewed Labs: ordered. Radiology: ordered.   Syncope, etiology unclear.  Consider orthostatic syncope, arrhythmia, ACS, consider possibility of a slip and fall with head injury.  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  I have ordered CT of head and cervical spine to rule out traumatic injury and intracerebral  hemorrhage.  I have ordered laboratory workup of CBC, basic metabolic panel, troponin x 2.  I have reviewed and interpreted her laboratory tests, and my interpretation is mild leukopenia with which appears to be a part of a viral syndrome as it is associated with right shift.  Mild hypokalemia noted and I have ordered a dose of oral potassium.  Initial troponin is normal with repeat troponin pending.  CT scans are still pending.  Case is signed out to Dr. Doren Custard.  Final Clinical Impression(s) / ED Diagnoses Final diagnoses:  Syncope, unspecified syncope type  Hypokalemia  Leukopenia, unspecified type    Rx / DC Orders ED Discharge Orders          Ordered    potassium chloride SA (KLOR-CON M) 20 MEQ tablet  Daily        07/01/22 [id number]              Delora Fuel, MD [id number]

## 2022-07-01 NOTE — Discharge Instructions (Signed)
Your potassium was low today.  You were given a potassium supplement in the ER and prescribed supplements for the next 3 days.  Continue to stay hydrated at home.  Avoid alcohol use.  Take Tylenol and ibuprofen as needed for headache.  Return to the emergency department for any new or worsening symptoms of concern.

## 2022-07-01 NOTE — ED Triage Notes (Signed)
Pt BIBEMS s/p fall face first onto bathroom floor. As per EMS report positive LOC. ETOH on board. Pt arrived w/ C-Collar in place

## 2022-07-03 ENCOUNTER — Other Ambulatory Visit: Payer: Self-pay

## 2022-07-05 ENCOUNTER — Encounter (HOSPITAL_COMMUNITY): Payer: Self-pay

## 2022-07-05 ENCOUNTER — Ambulatory Visit
Admission: EM | Admit: 2022-07-05 | Discharge: 2022-07-05 | Disposition: A | Discharge status: Home / Self Care | Payer: 59

## 2022-07-05 ENCOUNTER — Emergency Department (HOSPITAL_COMMUNITY)
Admission: EM | Admit: 2022-07-05 | Discharge: 2022-07-05 | Disposition: A | Discharge status: Home / Self Care | Payer: 59 | Attending: Emergency Medicine | Admitting: Emergency Medicine

## 2022-07-05 ENCOUNTER — Ambulatory Visit (INDEPENDENT_AMBULATORY_CARE_PROVIDER_SITE_OTHER): Payer: Self-pay | Admitting: *Deleted

## 2022-07-05 ENCOUNTER — Emergency Department (HOSPITAL_COMMUNITY): Payer: 59

## 2022-07-05 ENCOUNTER — Other Ambulatory Visit: Payer: Self-pay

## 2022-07-05 DIAGNOSIS — G9340 Encephalopathy, unspecified: Secondary | ICD-10-CM

## 2022-07-05 DIAGNOSIS — X58XXXA Exposure to other specified factors, initial encounter: Secondary | ICD-10-CM | POA: Diagnosis not present

## 2022-07-05 DIAGNOSIS — S060X1A Concussion with loss of consciousness of 30 minutes or less, initial encounter: Secondary | ICD-10-CM

## 2022-07-05 DIAGNOSIS — Y92002 Bathroom of unspecified non-institutional (private) residence single-family (private) house as the place of occurrence of the external cause: Secondary | ICD-10-CM | POA: Insufficient documentation

## 2022-07-05 DIAGNOSIS — R55 Syncope and collapse: Secondary | ICD-10-CM

## 2022-07-05 DIAGNOSIS — Z79899 Other long term (current) drug therapy: Secondary | ICD-10-CM | POA: Insufficient documentation

## 2022-07-05 DIAGNOSIS — I10 Essential (primary) hypertension: Secondary | ICD-10-CM | POA: Insufficient documentation

## 2022-07-05 LAB — BASIC METABOLIC PANEL
Anion gap: 12 (ref 5–15)
BUN: 13 mg/dL (ref 8–23)
CO2: 25 mmol/L (ref 22–32)
Calcium: 9.4 mg/dL (ref 8.9–10.3)
Chloride: 101 mmol/L (ref 98–111)
Creatinine, Ser: 0.91 mg/dL (ref 0.44–1.00)
GFR, Estimated: >60 mL/min (ref >60)
Glucose, Bld: 82 mg/dL (ref 70–99)
Potassium: 3.4 mmol/L — ABNORMAL LOW (ref 3.5–5.1)
Sodium: 138 mmol/L (ref 135–145)

## 2022-07-05 LAB — CBC WITH DIFFERENTIAL/PLATELET
Abs Immature Granulocytes: 0.01 10*3/uL (ref 0.00–0.07)
Basophils Absolute: 0 10*3/uL (ref 0.0–0.1)
Basophils Relative: 1 %
Eosinophils Absolute: 0.3 10*3/uL (ref 0.0–0.5)
Eosinophils Relative: 7 %
HCT: 39.2 % (ref 36.0–46.0)
Hemoglobin: 12.9 g/dL (ref 12.0–15.0)
Immature Granulocytes: 0 %
Lymphocytes Relative: 51 %
Lymphs Abs: 2 10*3/uL (ref 0.7–4.0)
MCH: 30.1 pg (ref 26.0–34.0)
MCHC: 32.9 g/dL (ref 30.0–36.0)
MCV: 91.6 fL (ref 80.0–100.0)
Monocytes Absolute: 0.4 10*3/uL (ref 0.1–1.0)
Monocytes Relative: 10 %
Neutro Abs: 1.2 10*3/uL — ABNORMAL LOW (ref 1.7–7.7)
Neutrophils Relative %: 31 %
Platelets: 205 10*3/uL (ref 150–400)
RBC: 4.28 MIL/uL (ref 3.87–5.11)
RDW: 14.1 % (ref 11.5–15.5)
WBC: 3.9 10*3/uL — ABNORMAL LOW (ref 4.0–10.5)
nRBC: 0 % (ref 0.0–0.2)
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
## 2022-07-05 NOTE — Telephone Encounter (Signed)
  Chief Complaint: Headache, dizzy, SOB Symptoms: Pt fell 07/01/22, seen in Ed. States today with headache, dizziness, loss of balance, SOB with minimal exertion, hands shaking, generalizes weakness Frequency: Today Pertinent Negatives: Patient denies  Disposition: [x] ED /[] Urgent Care (no appt availability in office) / [] Appointment(In office/virtual)/ []  [facility]/ [] Home Care/ [] Refused Recommended Disposition /[] [facility]/ []  Follow-up with PCP Additional Notes: Agent offered appt today with PCP prior to triage, declined "Too dizzy."  Advised ED, states will follow disposition.  Reason for Disposition  Unable to walk, or can only walk with assistance (e.g., requires support)    Dizzy, loss of balance S/P fall 07/01/22  Answer Assessment - Initial Assessment Questions 1. LOCATION: "Where does it hurt?"      Back of head 2. ONSET: "When did the headache start?" (Minutes, hours or days)      07/01/22   S/P fall 3. PATTERN: "Does the pain come and go, or has it been constant since it started?"     Constant 4. SEVERITY: "How bad is the pain?" and "What does it keep you from doing?"  (e.g., Scale 1-10; mild, moderate, or severe)   - MILD (1-3): doesn't interfere with normal activities    - MODERATE (4-7): interferes with normal activities or awakens from sleep    - SEVERE (8-10): excruciating pain, unable to do any normal activities        5/10, knot tender 5. RECURRENT SYMPTOM: "Have you ever had headaches before?" If Yes, ask: "When was the last time?" and "What happened that time?"      No 6. CAUSE: "What do you think is causing the headache?"     "Knot on back of head." 7. MIGRAINE: "Have you been diagnosed with migraine headaches?" If Yes, ask: "Is this headache similar?"      no 8. HEAD INJURY: "Has there been any recent injury to the head?"      [facility] Saturday 9. OTHER SYMPTOMS: "Do you have any other symptoms?" (fever, stiff neck, eye pain, sore throat,  cold symptoms)     SOB with mminimal exertion, dizzy, weakness, off balance, hands shaking  Protocols used: Headache-A-AH
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
## 2022-07-05 NOTE — Discharge Instructions (Signed)
You came to the emergency department today with continued symptoms after your fall.  Your repeat imaging is normal.  There are no concerns on your lab work.  Your physical exam is also normal.  I suspect you have some degree of a concussion.  I would like you to make an appointment with your PCP for reevaluation later this week.  I have attached a work note until next week as well.  As we discussed, please return with any worsening or recurring symptoms.  Especially slurred speech, unilateral weakness, blurred vision or any other concerns.  There are 2 neurology offices on these discharge papers for you to use as needed.
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
## 2022-07-05 NOTE — ED Provider Notes (Signed)
[facility]  Note:  This document was prepared using Systems analyst and may include unintentional dictation errors.  MRN: [id number] DOB: 1955-09-01  Subjective:   [name] is a 67 y.o. female presenting for acute onset today of feeling dazed, confused, left posterior temporal headache, weakness.  Patient was recently seen 07/01/2022 for the syncope that she suffered.  CT imaging was negative of the head and cervical spine.  She was started on potassium supplementation.  Reports that she felt fine yesterday but had a rapid turnaround today.  She did contact her primary care provider who advised she present to the emergency room.  No current facility-administered medications for this encounter.  Current Outpatient Medications:    ALOE VERA PO, Take 1 Dose by mouth daily., Disp: , Rfl:    Apoaequorin (PREVAGEN PO), Take 1 capsule by mouth 2 (two) times a week., Disp: , Rfl:    Ascorbic Acid (VITAMIN C PO), Take 1 tablet by mouth daily., Disp: , Rfl:    chlorhexidine (PERIDEX) 0.12 % solution, Use as directed 15 mLs in the mouth or throat 2 (two) times daily. (Patient not taking: Reported on 03/20/2022), Disp: 473 mL, Rfl: 0   chlorhexidine (PERIDEX) 0.12 % solution, Use a cap full and gargle after brushing teeth twice daily, Disp: 473 mL, Rfl: 1   COENZYME Q10 PO, Take 1 capsule by mouth daily., Disp: , Rfl:    Cyanocobalamin (VITAMIN B 12 PO), Take by mouth daily., Disp: , Rfl:    dextromethorphan (TUSSIN COUGH) 15 MG/5ML syrup, Take 10 mLs (30 mg total) by mouth 4 (four) times daily as needed for cough., Disp: 120 mL, Rfl: 0   fluorometholone (FML) 0.1 % ophthalmic suspension, Place 1 drop into the right eye 4 (four) times daily. (Patient not taking: Reported on 04/13/2022), Disp: 5 mL, Rfl: 0   fluticasone (FLONASE) 50 MCG/ACT nasal spray, PLACE 2 SPRAYS INTO BOTH NOSTRILS DAILY. (Patient not taking: Reported on 11/04/2021), Disp: 16 g, Rfl:  2   hydrochlorothiazide (HYDRODIURIL) 25 MG tablet, TAKE 1 TABLET BY MOUTH DAILY, Disp: 100 tablet, Rfl: 0   ibuprofen (ADVIL) 600 MG tablet, Take 1 tablet (600 mg total) by mouth every 8 (eight) hours as needed., Disp: 60 tablet, Rfl: 1   loratadine (CLARITIN) 10 MG tablet, Take 1 tablet (10 mg total) by mouth daily., Disp: 30 tablet, Rfl: 11   naproxen sodium (ALEVE) 220 MG tablet, Take 220 mg by mouth daily as needed (pain.)., Disp: , Rfl:    Omega-3 Fatty Acids (OMEGA 3 PO), Take 1 capsule by mouth daily., Disp: , Rfl:    oxyCODONE-acetaminophen (PERCOCET) 5-325 MG tablet, Take 1 tablet by mouth every 4 (four) hours as needed. (Patient not taking: Reported on 04/13/2022), Disp: 30 tablet, Rfl: 0   potassium chloride SA (KLOR-CON M) 20 MEQ tablet, Take 1 tablet (20 mEq total) by mouth daily., Disp: 30 tablet, Rfl: 3   pravastatin (PRAVACHOL) 40 MG tablet, Take 1 tablet (40 mg total) by mouth daily., Disp: 90 tablet, Rfl: 1   VITAMIN D PO, Take 1 tablet by mouth daily., Disp: , Rfl:    Allergies  Allergen Reactions   Lisinopril     Suspect ACE inhibitor allergy as patient creatinine nearly doubled after initiation of lisinopril    Past Medical History:  Diagnosis Date   Allergy    allergy , sinus issues   Anemia    when younger   Arthritis  HANDS,KNEES,"LITTLE BIT"   Hx of blood clots 04/04/1991   previously on Coumadin x 3 months in the past    Hyperlipidemia    on meds   Hypertension    on meds   Positive colorectal cancer screening using Cologuard test 03/02/2021     Past Surgical History:  Procedure Laterality Date   COLONOSCOPY     POLYPECTOMY     TOOTH EXTRACTION N/A 03/21/2022   Procedure: EXTRACTION ALL REMAINING TEETH NUMBER THREE, FIVE, SIX, SEVEN, EIGHT, NINE, TEN, ELEVEN, TWELVE, TWENTY ONE, TWENTY TWO, TWENTY THREE, TWENTY FOUR, TWENTY FIVE, TWENTY SIX, TWENTY SEVEN, TWENTY EIGHT, TWENTY NINE, THIRTY TWO, ALVEO LOPLASTY;  Surgeon: Diona Browner, DMD;  Location:  [facility] OR;  Service: Oral Surgery;  Laterality: N/A;   tooth pulled with sedation     many years ago    Family History  Problem Relation Age of Onset   Breast cancer Neg Hx    Colon cancer Neg Hx    Colon polyps Neg Hx    Esophageal cancer Neg Hx    Rectal cancer Neg Hx    Stomach cancer Neg Hx    Crohn's disease Neg Hx     Social History   Tobacco Use   Smoking status: Never    Passive exposure: Current   Smokeless tobacco: Never  Vaping Use   Vaping Use: Never used  Substance Use Topics   Alcohol use: Yes    Alcohol/week: 2.0 standard drinks of alcohol    Types: 2 Cans of beer per week    Comment: occasionally   Drug use: No    ROS   Objective:   Vitals: BP (!) 136/90 (BP Location: Left Arm)   Pulse 69   Temp 97.7 F (36.5 C) (Oral)   SpO2 95%   Physical Exam Constitutional:      General: She is not in acute distress.    Appearance: Normal appearance. She is well-developed and normal weight. She is not ill-appearing, toxic-appearing or diaphoretic.  HENT:     Head: Normocephalic and atraumatic.      Right Ear: Tympanic membrane, ear canal and external ear normal. No drainage or tenderness. No middle ear effusion. There is no impacted cerumen. Tympanic membrane is not erythematous or bulging.     Left Ear: Tympanic membrane, ear canal and external ear normal. No drainage or tenderness.  No middle ear effusion. There is no impacted cerumen. Tympanic membrane is not erythematous or bulging.     Nose: Nose normal. No congestion or rhinorrhea.     Mouth/Throat:     Mouth: Mucous membranes are moist. No oral lesions.     Pharynx: No pharyngeal swelling, oropharyngeal exudate, posterior oropharyngeal erythema or uvula swelling.     Tonsils: No tonsillar exudate or tonsillar abscesses.  Eyes:     General: Lids are normal. No scleral icterus.       Right eye: No discharge.        Left eye: No discharge.     Extraocular Movements: Extraocular movements intact.      Right eye: Normal extraocular motion.     Left eye: Normal extraocular motion.     Conjunctiva/sclera: Conjunctivae normal.     Pupils: Pupils are equal, round, and reactive to light.  Neck:     Meningeal: Brudzinski's sign and Kernig's sign absent.  Cardiovascular:     Rate and Rhythm: Normal rate.  Pulmonary:     Effort: Pulmonary effort is normal.  Musculoskeletal:  General: No tenderness. Normal range of motion.     Cervical back: Normal range of motion and neck supple.  Lymphadenopathy:     Cervical: No cervical adenopathy.  Skin:    General: Skin is warm and dry.  Neurological:     General: No focal deficit present.     Mental Status: She is alert and oriented to person, place, and time.     Cranial Nerves: No cranial nerve deficit, dysarthria or facial asymmetry.     Motor: Weakness (slight weakness of the left upper and lower extremity compared to the right) present. No pronator drift.     Coordination: Romberg sign negative. Coordination normal. Finger-Nose-Finger Test and Heel to [facility] Test normal. Rapid alternating movements normal.     Gait: Gait and tandem walk normal.     Deep Tendon Reflexes: Reflexes normal.  Psychiatric:        Mood and Affect: Mood normal.        Behavior: Behavior normal.        Thought Content: Thought content normal.        Judgment: Judgment normal.     Assessment and Plan :   PDMP not reviewed this encounter.  1. Acute encephalopathy   2. Syncope and collapse     Patient is in need of higher level of care than we can provide in the urgent care setting including consideration for repeat imaging to rule out an acute encephalopathy, subdural hematoma, intracranial hemorrhage, intracranial injury.  I discussed this with patient and her husband and they are agreeable to present to the emergency room now.   Jaynee Eagles, [place] 07/05/22 [id number]

## 2022-07-05 NOTE — Telephone Encounter (Signed)
Noted  

## 2022-07-05 NOTE — Discharge Instructions (Signed)
Please head to the emergency room now as you are in need of a higher level of care than we can provide in the urgent care setting. This includes testing to rule out subdural hematoma, intracranial hemorrhage, stroke, acute encephalopathy. The risk is higher due to your recent syncope, collapse and head trauma. Please do not go home, go straight to the emergency room.

## 2022-07-05 NOTE — ED Notes (Signed)
Patient is being discharged from the Urgent Care and sent to the Emergency Department via POV. Per [place], [place], patient is in need of higher level of care due to recent fall. Patient is aware and verbalizes understanding of plan of care. Pt has left with family.  Vitals:   07/05/22 1412  BP: (!) 136/90  Pulse: 69  Temp: 97.7 F (36.5 C)  SpO2: 95%
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
## 2022-07-11 ENCOUNTER — Ambulatory Visit (INDEPENDENT_AMBULATORY_CARE_PROVIDER_SITE_OTHER): Payer: Self-pay | Admitting: *Deleted

## 2022-07-11 NOTE — Telephone Encounter (Signed)
Pt has been called and given an appointment for 07/14/2022

## 2022-07-11 NOTE — Telephone Encounter (Signed)
Summary: right eye concern   The patient would like to speak with a member of clinical staff regarding their right eye concern  The patient would like to speak with a member of staff regarding their vision concerns  The patient shares that their eye is red in color with slightly blurred vision  The patient shares that they were seen in the ED on 07/01/22 for a concussion and continue to experience eye concerns  Please contact the patient further when possible         Attempted to call patient- no answer- left message to call office regarding symptoms

## 2022-07-11 NOTE — Telephone Encounter (Signed)
  Chief Complaint: Eye pain, redness, swelling, blurry vision Symptoms: above Frequency: Since concussion Pertinent Negatives: Patient denies fever Disposition: [x] ED /[] Urgent Care (no appt availability in office) / [] Appointment(In office/virtual)/ []  [facility]/ [] Home Care/ [] Refused Recommended Disposition /[] [facility]/ []  Follow-up with PCP Additional Notes: Pt states that since her fall in the bathroom her right eye has been bothering her. She reports pain, swelling, redness, blurry vision, and a feeling of something in her eye. Pt will go to ED.    Summary: right eye concern   The patient would like to speak with a member of clinical staff regarding their right eye concern  The patient would like to speak with a member of staff regarding their vision concerns  The patient shares that their eye is red in color with slightly blurred vision  The patient shares that they were seen in the ED on 07/01/22 for a concussion and continue to experience eye concerns  Please contact the patient further when possible        Reason for Disposition  [1] Foreign body sensation ("feels like something is in there") AND [2] irrigation didn't help  Answer Assessment - Initial Assessment Questions 1. LOCATION: Location: "What's red, the eyeball or the outer eyelids?" (Note: when callers say the eye is red, they usually mean the sclera is red)       Right eye 2. REDNESS OF SCLERA: "Is the redness in one or both eyes?" "When did the redness start?"      White part 3. ONSET: "When did the eye become red?" (e.g., hours, days)      After fall. 4. EYELIDS: "Are the eyelids red or swollen?" If Yes, ask: "How much?"      Yes - swollen 5. VISION: "Is there any difficulty seeing clearly?"      No - blurry vision in right eye 6. ITCHING: "Does it feel itchy?" If so ask: "How bad is it" (e.g., Scale 1-10; or mild, moderate, severe)     Feels like something in there 7. PAIN: "Is  there any pain? If Yes, ask: "How bad is it?" (e.g., Scale 1-10; or mild, moderate, severe)     Pain - moderate-severe 8. CONTACT LENS: "Do you wear contacts?"      9. CAUSE: "What do you think is causing the redness?"     Unsure 10. OTHER SYMPTOMS: "Do you have any other symptoms?" (e.g., fever, runny nose, cough, vomiting)       Runny nose  Protocols used: Eye - Red Without Pus-A-AH

## 2022-07-11 NOTE — Telephone Encounter (Signed)
Second attempt to contact patient- no answer- message left to contact office.

## 2022-07-12 NOTE — Progress Notes (Signed)
[facility]  PATIENT: [name] DOB: Aug 26, 1955  REFERRING DOCTOR OR PCP: Gwinda PasseMichelle Edwards, NP SOURCE: Patient, notes from the emergency room, imaging and lab reports, CT and MRI scan images personally reviewed.  _________________________________   HISTORICAL  CHIEF COMPLAINT:  No chief complaint on file.   HISTORY OF PRESENT ILLNESS:  ***   On 07/01/2022, she presented to the emergency room after she had an unwitnessed fall while going to the bathroom.  Her husband heard the fall and noted a 1 minute episode of loss of consciousness.  She was taken to the emergency room.  CT scan did not show any acute findings.  She presented to the emergency room 07/05/2022 with feelings of confusion and weakness and left posterior temporal headache.  Of note, she had felt fine the previous day.  Due to the symptoms her primary care advised her to go to the emergency room.  She had a repeat CT scan of the head and cervical spine with no new findings.   Imaging: CT scan of the head 07/05/2022 and 07/01/2022 and cervical spine 07/22/2022 and 07/05/2022 and 07/01/2022 showed a normal brain.  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  The cervical spine showed degenerative changes.  The CT scan spine showed uncovertebral spurring and moderate foraminal narrowing to the left at C4-C5 that could affect the left C5 nerve root.  There is mild to moderate foraminal narrowing at C5-C6.  MRI of the brain 06/06/2012 showed some scattered T2/FLAIR hyperintense foci in the hemispheres consistent with chronic microvascular ischemic change.  There were no acute findings.  The CT scan showed uncovertebral spurring and moderate foraminal narrowing to the left at C4-C5 that could affect the left C5 nerve root.  There is mild to moderate foraminal narrowing at C5-C6.     REVIEW OF SYSTEMS: Constitutional: No fevers, chills, sweats, or change in appetite Eyes: No visual changes, double  vision, eye pain Ear, nose and throat: No hearing loss, ear pain, nasal congestion, sore throat Cardiovascular: No chest pain, palpitations Respiratory:  No shortness of breath at rest or with exertion.   No wheezes GastrointestinaI: No nausea, vomiting, diarrhea, abdominal pain, fecal incontinence Genitourinary:  No dysuria, urinary retention or frequency.  No nocturia. Musculoskeletal:  No neck pain, back pain Integumentary: No rash, pruritus, skin lesions Neurological: as above Psychiatric: No depression at this time.  No anxiety Endocrine: No palpitations, diaphoresis, change in appetite, change in weigh or increased thirst Hematologic/Lymphatic:  No anemia, purpura, petechiae. Allergic/Immunologic: No itchy/runny eyes, nasal congestion, recent allergic reactions, rashes  ALLERGIES: Allergies  Allergen Reactions   Lisinopril     Suspect ACE inhibitor allergy as patient creatinine nearly doubled after initiation of lisinopril    HOME MEDICATIONS:  Current Outpatient Medications:    ALOE VERA PO, Take 1 Dose by mouth daily., Disp: , Rfl:    Apoaequorin (PREVAGEN PO), Take 1 capsule by mouth 2 (two) times a week., Disp: , Rfl:    Ascorbic Acid (VITAMIN C PO), Take 1 tablet by mouth daily., Disp: , Rfl:    chlorhexidine (PERIDEX) 0.12 % solution, Use as directed 15 mLs in the mouth or throat 2 (two) times daily. (Patient not taking: Reported on 03/20/2022), Disp: 473 mL, Rfl: 0   chlorhexidine (PERIDEX) 0.12 % solution, Use a cap full and gargle after brushing teeth twice daily, Disp: 473 mL, Rfl: 1   COENZYME Q10 PO, Take 1 capsule by mouth daily., Disp: , Rfl:  Cyanocobalamin (VITAMIN B 12 PO), Take by mouth daily., Disp: , Rfl:    dextromethorphan (TUSSIN COUGH) 15 MG/5ML syrup, Take 10 mLs (30 mg total) by mouth 4 (four) times daily as needed for cough., Disp: 120 mL, Rfl: 0   fluorometholone (FML) 0.1 % ophthalmic suspension, Place 1 drop into the right eye 4 (four) times  daily. (Patient not taking: Reported on 04/13/2022), Disp: 5 mL, Rfl: 0   fluticasone (FLONASE) 50 MCG/ACT nasal spray, PLACE 2 SPRAYS INTO BOTH NOSTRILS DAILY. (Patient not taking: Reported on 11/04/2021), Disp: 16 g, Rfl: 2   hydrochlorothiazide (HYDRODIURIL) 25 MG tablet, TAKE 1 TABLET BY MOUTH DAILY, Disp: 100 tablet, Rfl: 0   ibuprofen (ADVIL) 600 MG tablet, Take 1 tablet (600 mg total) by mouth every 8 (eight) hours as needed., Disp: 60 tablet, Rfl: 1   loratadine (CLARITIN) 10 MG tablet, Take 1 tablet (10 mg total) by mouth daily., Disp: 30 tablet, Rfl: 11   naproxen sodium (ALEVE) 220 MG tablet, Take 220 mg by mouth daily as needed (pain.)., Disp: , Rfl:    Omega-3 Fatty Acids (OMEGA 3 PO), Take 1 capsule by mouth daily., Disp: , Rfl:    oxyCODONE-acetaminophen (PERCOCET) 5-325 MG tablet, Take 1 tablet by mouth every 4 (four) hours as needed. (Patient not taking: Reported on 04/13/2022), Disp: 30 tablet, Rfl: 0   potassium chloride SA (KLOR-CON M) 20 MEQ tablet, Take 1 tablet (20 mEq total) by mouth daily., Disp: 30 tablet, Rfl: 3   pravastatin (PRAVACHOL) 40 MG tablet, Take 1 tablet (40 mg total) by mouth daily., Disp: 90 tablet, Rfl: 1   VITAMIN D PO, Take 1 tablet by mouth daily., Disp: , Rfl:   PAST MEDICAL HISTORY: Past Medical History:  Diagnosis Date   Allergy    allergy , sinus issues   Anemia    when younger   Arthritis    HANDS,KNEES,"LITTLE BIT"   Hx of blood clots 04/04/1991   previously on Coumadin x 3 months in the past    Hyperlipidemia    on meds   Hypertension    on meds   Positive colorectal cancer screening using Cologuard test 03/02/2021    PAST SURGICAL HISTORY: Past Surgical History:  Procedure Laterality Date   COLONOSCOPY     POLYPECTOMY     TOOTH EXTRACTION N/A 03/21/2022   Procedure: EXTRACTION ALL REMAINING TEETH NUMBER THREE, FIVE, SIX, SEVEN, EIGHT, NINE, TEN, ELEVEN, TWELVE, TWENTY ONE, TWENTY TWO, TWENTY THREE, TWENTY FOUR, TWENTY FIVE, TWENTY  SIX, TWENTY SEVEN, TWENTY EIGHT, TWENTY NINE, THIRTY TWO, ALVEO LOPLASTY;  Surgeon: Ocie Doyne, DMD;  Location: [facility] OR;  Service: Oral Surgery;  Laterality: N/A;   tooth pulled with sedation     many years ago    FAMILY HISTORY: Family History  Problem Relation Age of Onset   Breast cancer Neg Hx    Colon cancer Neg Hx    Colon polyps Neg Hx    Esophageal cancer Neg Hx    Rectal cancer Neg Hx    Stomach cancer Neg Hx    Crohn's disease Neg Hx     SOCIAL HISTORY: Social History   Socioeconomic History   Marital status: Married    Spouse name: Not on file   Number of children: Not on file   Years of education: Not on file   Highest education level: Not on file  Occupational History   Not on file  Tobacco Use   Smoking status: Never  Passive exposure: Current   Smokeless tobacco: Never  Vaping Use   Vaping Use: Never used  Substance and Sexual Activity   Alcohol use: Yes    Alcohol/week: 2.0 standard drinks of alcohol    Types: 2 Cans of beer per week    Comment: occasionally   Drug use: No   Sexual activity: Not Currently  Other Topics Concern   Not on file  Social History Narrative   Not on file   Social Determinants of Health   Financial Resource Strain: Not on file  Food Insecurity: Not on file  Transportation Needs: Not on file  Physical Activity: Not on file  Stress: Not on file  Social Connections: Not on file  Intimate Partner Violence: Not on file       PHYSICAL EXAM  There were no vitals filed for this visit.  There is no height or weight on file to calculate BMI.   General: The patient is well-developed and well-nourished and in no acute distress  HEENT:  Head is [place]/AT.  Sclera are anicteric.  Funduscopic exam shows normal optic discs and retinal vessels.  Neck: No carotid bruits are noted.  The neck is nontender.  Cardiovascular: The heart has a regular rate and rhythm with a normal S1 and S2. There were no murmurs, gallops or  rubs.    Skin: Extremities are without rash or  edema.  Musculoskeletal:  Back is nontender  Neurologic Exam  Mental status: The patient is alert and oriented x 3 at the time of the examination. The patient has apparent normal recent and remote memory, with an apparently normal attention span and concentration ability.   Speech is normal.  Cranial nerves: Extraocular movements are full. Pupils are equal, round, and reactive to light and accomodation.  Visual fields are full.  Facial symmetry is present. There is good facial sensation to soft touch bilaterally.Facial strength is normal.  Trapezius and sternocleidomastoid strength is normal. No dysarthria is noted.  The tongue is midline, and the patient has symmetric elevation of the soft palate. No obvious hearing deficits are noted.  Motor:  Muscle bulk is normal.   Tone is normal. Strength is  5 / 5 in all 4 extremities.   Sensory: Sensory testing is intact to pinprick, soft touch and vibration sensation in all 4 extremities.  Coordination: Cerebellar testing reveals good finger-nose-finger and heel-to-shin bilaterally.  Gait and station: Station is normal.   Gait is normal. Tandem gait is normal. Romberg is negative.   Reflexes: Deep tendon reflexes are symmetric and normal bilaterally.   Plantar responses are flexor.    DIAGNOSTIC DATA (LABS, IMAGING, TESTING) - I reviewed patient records, labs, notes, testing and imaging myself where available.  Lab Results  Component Value Date   WBC 3.9 (L) 07/05/2022   HGB 12.9 07/05/2022   HCT 39.2 07/05/2022   MCV 91.6 07/05/2022   PLT 205 07/05/2022      Component Value Date/Time   NA 138 07/05/2022 1550   NA 141 10/28/2021 0830   K 3.4 (L) 07/05/2022 1550   CL 101 07/05/2022 1550   CO2 25 07/05/2022 1550   GLUCOSE 82 07/05/2022 1550   BUN 13 07/05/2022 1550   BUN 13 10/28/2021 0830   CREATININE 0.91 07/05/2022 1550   CREATININE 1.15 (H) 07/01/2012 1558   CALCIUM 9.4  07/05/2022 1550   PROT 7.9 10/28/2021 0830   ALBUMIN 4.7 10/28/2021 0830   AST 17 10/28/2021 0830   ALT 15 10/28/2021  0830   ALKPHOS 60 10/28/2021 0830   BILITOT 0.3 10/28/2021 0830   GFRNONAA >60 07/05/2022 1550   GFRAA 102 04/30/2020 0859   Lab Results  Component Value Date   CHOL 265 (H) 10/28/2021   HDL 48 10/28/2021   LDLCALC 170 (H) 10/28/2021   TRIG 248 (H) 10/28/2021   CHOLHDL 5.5 (H) 10/28/2021   Lab Results  Component Value Date   HGBA1C 5.6 11/18/2021   Lab Results  Component Value Date   VITAMINB12 1,236 04/30/2020   Lab Results  Component Value Date   TSH 1.400 10/13/2020       ASSESSMENT AND PLAN  ***   Athene Schuhmacher A. Epimenio Foot, MD, [facility] 07/12/2022, 7:19 PM Certified in Neurology, Clinical Neurophysiology, Sleep Medicine and Neuroimaging  [facility] [address], [address] [place], [place] [address] [phone]

## 2022-07-13 ENCOUNTER — Ambulatory Visit (INDEPENDENT_AMBULATORY_CARE_PROVIDER_SITE_OTHER): Payer: 59 | Admitting: Neurology

## 2022-07-13 ENCOUNTER — Other Ambulatory Visit: Payer: Self-pay

## 2022-07-13 ENCOUNTER — Encounter: Payer: Self-pay | Admitting: Neurology

## 2022-07-13 VITALS — BP 130/83 | HR 71 | Ht 66.0 in | Wt 188.0 lb

## 2022-07-13 DIAGNOSIS — S0990XA Unspecified injury of head, initial encounter: Secondary | ICD-10-CM

## 2022-07-13 DIAGNOSIS — F0781 Postconcussional syndrome: Secondary | ICD-10-CM

## 2022-07-13 DIAGNOSIS — R55 Syncope and collapse: Secondary | ICD-10-CM

## 2022-07-13 MED ORDER — IMIPRAMINE HCL 10 MG PO TABS
10.0000 mg | ORAL_TABLET | Freq: Every day | ORAL | 1 refills | Status: DC
  Filled 2022-07-13: qty 60, 30d supply, fill #0

## 2022-07-14 ENCOUNTER — Ambulatory Visit (INDEPENDENT_AMBULATORY_CARE_PROVIDER_SITE_OTHER): Payer: 59 | Admitting: Primary Care

## 2022-07-14 ENCOUNTER — Other Ambulatory Visit: Payer: Self-pay

## 2022-07-14 ENCOUNTER — Encounter (INDEPENDENT_AMBULATORY_CARE_PROVIDER_SITE_OTHER): Payer: Self-pay | Admitting: Primary Care

## 2022-07-14 VITALS — BP 137/94 | HR 66 | Ht 66.5 in | Wt 184.8 lb

## 2022-07-14 DIAGNOSIS — I10 Essential (primary) hypertension: Secondary | ICD-10-CM

## 2022-07-14 DIAGNOSIS — T7840XA Allergy, unspecified, initial encounter: Secondary | ICD-10-CM | POA: Diagnosis not present

## 2022-07-14 MED ORDER — CETIRIZINE HCL 10 MG PO TABS
10.0000 mg | ORAL_TABLET | Freq: Every day | ORAL | 1 refills | Status: DC
  Filled 2022-07-14: qty 90, 90d supply, fill #0

## 2022-07-17 ENCOUNTER — Telehealth: Payer: Self-pay | Admitting: Primary Care

## 2022-07-17 DIAGNOSIS — Z0100 Encounter for examination of eyes and vision without abnormal findings: Secondary | ICD-10-CM

## 2022-07-17 NOTE — Telephone Encounter (Signed)
Will forward to provider  

## 2022-07-17 NOTE — Telephone Encounter (Signed)
Copied from CRM [id number]. Topic: Referral - Request for Referral >> Jul 17, 2022 11:23 AM Everette C wrote: Has patient seen PCP for this complaint? Yes.   *If NO, is insurance requiring patient see PCP for this issue before PCP can refer them? Referral for which specialty: Ophthalmology  Preferred provider/office: [facility]  Reason for referral: right eye irritation and redness

## 2022-07-17 NOTE — Progress Notes (Signed)
[facility]  [name], is a 67 y.o. female  [id number]  [id number]  DOB - November 08, 1955  Chief Complaint  Patient presents with   Eye Pain   Dizziness       Subjective:   [name] is a 67 y.o. female here today for a acute visit. Patient has headache,itchy eyes and dizziness.  No chest pain, No abdominal pain - No Nausea, No new weakness tingling or numbness, No Cough - shortness of breath. Unable to associate dizziness with movement, activities, elevated Bp. Today Bp is elevated   No problems updated.  Allergies  Allergen Reactions   Lisinopril     Suspect ACE inhibitor allergy as patient creatinine nearly doubled after initiation of lisinopril    Past Medical History:  Diagnosis Date   Allergy    allergy , sinus issues   Anemia    when younger   Arthritis    HANDS,KNEES,"LITTLE BIT"   Hx of blood clots 04/04/1991   previously on Coumadin x 3 months in the past    Hyperlipidemia    on meds   Hypertension    on meds   Positive colorectal cancer screening using Cologuard test 03/02/2021    Current Outpatient Medications on File Prior to Visit  Medication Sig Dispense Refill   ALOE VERA PO Take 1 Dose by mouth daily.     Apoaequorin (PREVAGEN PO) Take 1 capsule by mouth 2 (two) times a week.     Ascorbic Acid (VITAMIN C PO) Take 1 tablet by mouth daily.     chlorhexidine (PERIDEX) 0.12 % solution Use as directed 15 mLs in the mouth or throat 2 (two) times daily. 473 mL 0   chlorhexidine (PERIDEX) 0.12 % solution Use a cap full and gargle after brushing teeth twice daily 473 mL 1   COENZYME Q10 PO Take 1 capsule by mouth daily.     Cyanocobalamin (VITAMIN B 12 PO) Take by mouth daily.     dextromethorphan (TUSSIN COUGH) 15 MG/5ML syrup Take 10 mLs (30 mg total) by mouth 4 (four) times daily as needed for cough. 120 mL 0   fluorometholone (FML) 0.1 % ophthalmic suspension Place 1 drop into the right eye 4 (four) times daily. 5 mL 0    fluticasone (FLONASE) 50 MCG/ACT nasal spray PLACE 2 SPRAYS INTO BOTH NOSTRILS DAILY. 16 g 2   hydrochlorothiazide (HYDRODIURIL) 25 MG tablet TAKE 1 TABLET BY MOUTH DAILY 100 tablet 0   ibuprofen (ADVIL) 600 MG tablet Take 1 tablet (600 mg total) by mouth every 8 (eight) hours as needed. 60 tablet 1   imipramine (TOFRANIL) 10 MG tablet Take 1-2 tablets (10-20 mg total) by mouth at bedtime. 60 tablet 1   naproxen sodium (ALEVE) 220 MG tablet Take 220 mg by mouth daily as needed (pain.).     Omega-3 Fatty Acids (OMEGA 3 PO) Take 1 capsule by mouth daily.     oxyCODONE-acetaminophen (PERCOCET) 5-325 MG tablet Take 1 tablet by mouth every 4 (four) hours as needed. 30 tablet 0   potassium chloride SA (KLOR-CON M) 20 MEQ tablet Take 1 tablet (20 mEq total) by mouth daily. 30 tablet 3   pravastatin (PRAVACHOL) 40 MG tablet Take 1 tablet (40 mg total) by mouth daily. 90 tablet 1   VITAMIN D PO Take 1 tablet by mouth daily.     [DISCONTINUED] CHLORHEXIDINE GLUCONATE, BULK, SOLN Use a cap full and gargle after brushing teeth twice daily (Patient not taking: Reported on 03/20/2022)  4000 mL 1   No current facility-administered medications on file prior to visit.    Objective:   Vitals:   07/14/22 1017  BP: (Abnormal) 137/94  Pulse: 66  SpO2: 98%  Weight: 184 lb 12.8 oz (83.8 kg)  Height: 5' 6.5" (1.689 m)    Comprehensive ROS Pertinent positive and negative noted in HPI   Exam General appearance : Awake, alert, not in any distress. Speech Clear. Not toxic looking HEENT: Atraumatic and Normocephalic, pupils equally reactive to light and accomodation Neck: Supple, no JVD. No cervical lymphadenopathy.  Chest: Good air entry bilaterally, no added sounds  CVS: S1 S2 regular, no murmurs.  Abdomen: Bowel sounds present, Non tender and not distended with no gaurding, rigidity or rebound. Extremities: B/L Lower Ext shows no edema, both legs are warm to touch Neurology: Awake alert, and oriented X  3,Non focal Skin: No Rash  Data Review Lab Results  Component Value Date   HGBA1C 5.6 11/18/2021   HGBA1C 5.9 (H) 04/30/2020   HGBA1C 5.6 10/10/2018    Assessment & Plan  [name] was seen today for eye pain and dizziness.  Diagnoses and all orders for this visit:  Allergy, initial encounter -     cetirizine (ZYRTEC) 10 MG tablet; Take 1 tablet (10 mg total) by mouth daily.  Essential hypertension On HCTZ but has not taken today. BP goal - < 140/90 Explained that having normal blood pressure is the goal and medications are helping to get to goal and maintain normal blood pressure. DIET: Limit salt intake, read nutrition labels to check salt content, limit fried and high fatty foods  Avoid using multisymptom OTC cold preparations that generally contain sudafed which can rise BP. Consult with pharmacist on best cold relief products to use for persons with HTN EXERCISE Discussed incorporating exercise such as walking - 30 minutes most days of the week and can do in 10 minute intervals       Patient have been counseled extensively about nutrition and exercise. Other issues discussed during this visit include: low cholesterol diet, weight control and daily exercise, foot care, annual eye examinations at Ophthalmology, importance of adherence with medications and regular follow-up. We also discussed long term complications of uncontrolled diabetes and hypertension.   No follow-ups on file.  The patient was given clear instructions to go to ER or return to medical center if symptoms don't improve, worsen or new problems develop. The patient verbalized understanding. The patient was told to call to get lab results if they haven't heard anything in the next week.   This note has been created with Education officer, environmental. Any transcriptional errors are unintentional.   Grayce Sessions, NP 07/17/2022, 3:01 PM

## 2022-07-19 NOTE — Addendum Note (Signed)
Addended by: Guy Franco on: 07/19/2022 03:23 PM   Modules accepted: Orders

## 2022-07-19 NOTE — Telephone Encounter (Signed)
Pls call pt as soon as possible, her head is still hurting and is concerned wanting referral as soon as possible. [phone]

## 2022-07-19 NOTE — Telephone Encounter (Signed)
Patient is requesting referral to Ophthalmology. Head pain is d/t fall and concussion. Denies more pain than what has already been. Has followed up with Neurology.   She is requesting a referral because she steady places drops in eye prescribed from another provider but they are not helping. She is wanting to transfer.

## 2022-07-21 ENCOUNTER — Other Ambulatory Visit: Payer: Self-pay

## 2022-07-21 ENCOUNTER — Other Ambulatory Visit (INDEPENDENT_AMBULATORY_CARE_PROVIDER_SITE_OTHER): Payer: Self-pay | Admitting: Primary Care

## 2022-07-21 DIAGNOSIS — I10 Essential (primary) hypertension: Secondary | ICD-10-CM

## 2022-07-21 DIAGNOSIS — Z760 Encounter for issue of repeat prescription: Secondary | ICD-10-CM

## 2022-07-21 DIAGNOSIS — H5711 Ocular pain, right eye: Secondary | ICD-10-CM

## 2022-07-21 MED ORDER — HYDROCHLOROTHIAZIDE 25 MG PO TABS
25.0000 mg | ORAL_TABLET | Freq: Every day | ORAL | 0 refills | Status: DC
Start: 2022-07-21 — End: 2022-11-01
  Filled 2022-07-21: qty 100, 100d supply, fill #0

## 2022-07-21 NOTE — Telephone Encounter (Signed)
Copied from CRM [id number]. Topic: General - Other >> Jul 21, 2022 10:27 AM [name] wrote: Reason for CRM: Medication Refill - Medication: hydrochlorothiazide (HYDRODIURIL) 25 MG tablet [[id number]]  Has the patient contacted their pharmacy? Yes.   (Agent: If no, request that the patient contact the pharmacy for the refill. If patient does not wish to contact the pharmacy document the reason why and proceed with request.) (Agent: If yes, when and what did the pharmacy advise?)  Preferred Pharmacy (with phone number or street name): [facility] [address], [address] [place] [address] Phone: [phone] Fax: [phone] Hours: M-F 7:30a-6:00p   Has the patient been seen for an appointment in the last year OR does the patient have an upcoming appointment? Yes.    Agent: Please be advised that RX refills may take up to 3 business days. We ask that you follow-up with your pharmacy.

## 2022-07-21 NOTE — Telephone Encounter (Signed)
Requested Prescriptions  Pending Prescriptions Disp Refills   hydrochlorothiazide (HYDRODIURIL) 25 MG tablet 100 tablet 0    Sig: Take 1 tablet (25 mg total) by mouth daily.     Cardiovascular: Diuretics - Thiazide Failed - 07/21/2022 10:35 AM      Failed - K in normal range and within 180 days    Potassium  Date Value Ref Range Status  07/05/2022 3.4 (L) 3.5 - 5.1 mmol/L Final         Failed - Last BP in normal range    BP Readings from Last 1 Encounters:  07/14/22 (!) 137/94         Passed - Cr in normal range and within 180 days    Creat  Date Value Ref Range Status  07/01/2012 1.15 (H) 0.50 - 1.10 mg/dL Final   Creatinine, Ser  Date Value Ref Range Status  07/05/2022 0.91 0.44 - 1.00 mg/dL Final         Passed - Na in normal range and within 180 days    Sodium  Date Value Ref Range Status  07/05/2022 138 135 - 145 mmol/L Final  10/28/2021 141 134 - 144 mmol/L Final         Passed - Valid encounter within last 6 months    Recent Outpatient Visits           1 week ago Allergy, initial encounter   [place] [facility] Grayce Sessions, NP   1 month ago Acute URI   [place] [facility] Grayce Sessions, NP   3 months ago Decreased estrogen level   [place] [facility] Grayce Sessions, NP   5 months ago Dental caries   [place] [facility] Grayce Sessions, NP   8 months ago Prediabetes   [place] [facility] Grayce Sessions, NP       Future Appointments             In 2 months Randa Evens, Kinnie Scales, NP  [facility]

## 2022-07-25 ENCOUNTER — Other Ambulatory Visit: Payer: Self-pay

## 2022-07-25 MED ORDER — PREDNISONE 10 MG PO TABS
10.0000 mg | ORAL_TABLET | Freq: Four times a day (QID) | ORAL | 1 refills | Status: DC
  Filled 2022-07-25: qty 40, 10d supply, fill #0

## 2022-07-25 MED ORDER — PREDNISOLONE ACETATE 1 % OP SUSP
1.0000 [drp] | Freq: Four times a day (QID) | OPHTHALMIC | 1 refills | Status: DC
  Filled 2022-07-25: qty 10, 50d supply, fill #0

## 2022-09-11 ENCOUNTER — Encounter (INDEPENDENT_AMBULATORY_CARE_PROVIDER_SITE_OTHER): Payer: Self-pay

## 2022-09-11 ENCOUNTER — Ambulatory Visit (INDEPENDENT_AMBULATORY_CARE_PROVIDER_SITE_OTHER): Payer: 59

## 2022-09-11 VITALS — Ht 66.5 in | Wt 184.0 lb

## 2022-09-11 DIAGNOSIS — Z0000 Encounter for general adult medical examination without abnormal findings: Secondary | ICD-10-CM | POA: Diagnosis not present

## 2022-09-11 NOTE — Patient Instructions (Signed)
[name] , Thank you for taking time to come for your Medicare Wellness Visit. I appreciate your ongoing commitment to your health goals. Please review the following plan we discussed and let me know if I can assist you in the future.   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         This is a list of the screening recommended for you and due dates:  Health Maintenance  Topic Date Due   Zoster (Shingles) Vaccine (1 of 2) Never done   Yearly kidney health urinalysis for diabetes  10/10/2019   DEXA scan (bone density measurement)  Never done   COVID-19 Vaccine (3 - 2023-24 season) 12/02/2021   Hemoglobin A1C  05/21/2022   Pneumonia Vaccine (2 of 2 - PPSV23 or PCV20) 09/30/2022*   Flu Shot  11/02/2022   Complete foot exam   11/19/2022   Stool Blood Test  11/26/2022   Yearly kidney function blood test for diabetes  07/05/2023   Eye exam for diabetics  08/08/2023   Medicare Annual Wellness Visit  09/11/2023   Mammogram  05/12/2024   DTaP/Tdap/Td vaccine (2 - Td or Tdap) 10/09/2028   Hepatitis C Screening  Completed   HPV Vaccine  Aged Out   Colon Cancer Screening  Discontinued  *Topic was postponed. The date shown is not the original due date.    Advanced directives: Advance directive discussed with you today. Even though you declined this today, please call our office should you change your mind, and we can give you the proper paperwork for you to fill out. Advance care planning is a way to make decisions about medical care that fits your values in case you are ever unable to make these decisions for yourself.  Information on Advanced Care Planning can be found at [facility] of [place] Advance Health Care Directives Advance Health Care Directives ([email or web address]    Conditions/risks identified: Aim for 30 minutes of exercise or brisk  walking, 6-8 glasses of water, and 5 servings of fruits and vegetables each day.   Next appointment: Follow up in one year for your annual wellness visit September 17, 2023 at 9am TELEPHONE VISIT   Preventive Care 65 Years and Older, Female Preventive care refers to lifestyle choices and visits with your health care provider that can promote health and wellness. What does preventive care include? A yearly physical exam. This is also called an annual well check. Dental exams once or twice a year. Routine eye exams. Ask your health care provider how often you should have your eyes checked. Personal lifestyle choices, including: Daily care of your teeth and gums. Regular physical activity. Eating a healthy diet. Avoiding tobacco and drug use. Limiting alcohol use. Practicing safe sex. Taking low-dose aspirin every day. Taking vitamin and mineral supplements as recommended by your health care provider. What happens during an annual well check? The services and screenings done by your health care provider during your annual well check will depend on your age, overall health, lifestyle risk factors, and family history of disease. Counseling  Your health care provider may ask you questions about your: Alcohol use. Tobacco use. Drug use. Emotional well-being. Home and relationship well-being. Sexual activity. Eating habits. History of falls. Memory and ability to understand (cognition). Work and work Astronomer.  Reproductive health. Screening  You may have the following tests or measurements: Height, weight, and BMI. Blood pressure. Lipid and cholesterol levels. These may be checked every 5 years, or more frequently if you are over 67 years old. Skin check. Lung cancer screening. You may have this screening every year starting at age 55 if you have a 30-pack-year history of smoking and currently smoke or have quit within the past 15 years. Fecal occult blood test (FOBT) of the stool.  You may have this test every year starting at age 69. Flexible sigmoidoscopy or colonoscopy. You may have a sigmoidoscopy every 5 years or a colonoscopy every 10 years starting at age 53. Hepatitis C blood test. Hepatitis B blood test. Sexually transmitted disease (STD) testing. Diabetes screening. This is done by checking your blood sugar (glucose) after you have not eaten for a while (fasting). You may have this done every 1-3 years. Bone density scan. This is done to screen for osteoporosis. You may have this done starting at age 21. Mammogram. This may be done every 1-2 years. Talk to your health care provider about how often you should have regular mammograms. Talk with your health care provider about your test results, treatment options, and if necessary, the need for more tests. Vaccines  Your health care provider may recommend certain vaccines, such as: Influenza vaccine. This is recommended every year. Tetanus, diphtheria, and acellular pertussis (Tdap, Td) vaccine. You may need a Td booster every 10 years. Zoster vaccine. You may need this after age 30. Pneumococcal 13-valent conjugate (PCV13) vaccine. One dose is recommended after age 34. Pneumococcal polysaccharide (PPSV23) vaccine. One dose is recommended after age 29. Talk to your health care provider about which screenings and vaccines you need and how often you need them. This information is not intended to replace advice given to you by your health care provider. Make sure you discuss any questions you have with your health care provider. Document Released: 04/16/2015 Document Revised: 12/08/2015 Document Reviewed: 01/19/2015 Elsevier Interactive Patient Education  2017 [employer].  Fall Prevention in the Home Falls can cause injuries. They can happen to people of all ages. There are many things you can do to make your home safe and to help prevent falls. What can I do on the outside of my home? Regularly fix the edges of  walkways and driveways and fix any cracks. Remove anything that might make you trip as you walk through a door, such as a raised step or threshold. Trim any bushes or trees on the path to your home. Use bright outdoor lighting. Clear any walking paths of anything that might make someone trip, such as rocks or tools. Regularly check to see if handrails are loose or broken. Make sure that both sides of any steps have handrails. Any raised decks and porches should have guardrails on the edges. Have any leaves, snow, or ice cleared regularly. Use sand or salt on walking paths during winter. Clean up any spills in your garage right away. This includes oil or grease spills. What can I do in the bathroom? Use night lights. Install grab bars by the toilet and in the tub and shower. Do not use towel bars as grab bars. Use non-skid mats or decals in the tub or shower. If you need to sit down in the shower, use a plastic, non-slip stool. Keep the floor dry. Clean up any water that spills on the floor as soon as it happens. Remove soap buildup in the  tub or shower regularly. Attach bath mats securely with double-sided non-slip rug tape. Do not have throw rugs and other things on the floor that can make you trip. What can I do in the bedroom? Use night lights. Make sure that you have a light by your bed that is easy to reach. Do not use any sheets or blankets that are too big for your bed. They should not hang down onto the floor. Have a firm chair that has side arms. You can use this for support while you get dressed. Do not have throw rugs and other things on the floor that can make you trip. What can I do in the kitchen? Clean up any spills right away. Avoid walking on wet floors. Keep items that you use a lot in easy-to-reach places. If you need to reach something above you, use a strong step stool that has a grab bar. Keep electrical cords out of the way. Do not use floor polish or wax that  makes floors slippery. If you must use wax, use non-skid floor wax. Do not have throw rugs and other things on the floor that can make you trip. What can I do with my stairs? Do not leave any items on the stairs. Make sure that there are handrails on both sides of the stairs and use them. Fix handrails that are broken or loose. Make sure that handrails are as long as the stairways. Check any carpeting to make sure that it is firmly attached to the stairs. Fix any carpet that is loose or worn. Avoid having throw rugs at the top or bottom of the stairs. If you do have throw rugs, attach them to the floor with carpet tape. Make sure that you have a light switch at the top of the stairs and the bottom of the stairs. If you do not have them, ask someone to add them for you. What else can I do to help prevent falls? Wear shoes that: Do not have high heels. Have rubber bottoms. Are comfortable and fit you well. Are closed at the toe. Do not wear sandals. If you use a stepladder: Make sure that it is fully opened. Do not climb a closed stepladder. Make sure that both sides of the stepladder are locked into place. Ask someone to hold it for you, if possible. Clearly mark and make sure that you can see: Any grab bars or handrails. First and last steps. Where the edge of each step is. Use tools that help you move around (mobility aids) if they are needed. These include: Canes. Walkers. Scooters. Crutches. Turn on the lights when you go into a dark area. Replace any light bulbs as soon as they burn out. Set up your furniture so you have a clear path. Avoid moving your furniture around. If any of your floors are uneven, fix them. If there are any pets around you, be aware of where they are. Review your medicines with your doctor. Some medicines can make you feel dizzy. This can increase your chance of falling. Ask your doctor what other things that you can do to help prevent falls. This  information is not intended to replace advice given to you by your health care provider. Make sure you discuss any questions you have with your health care provider. Document Released: 01/14/2009 Document Revised: 08/26/2015 Document Reviewed: 04/24/2014 Elsevier Interactive Patient Education  2017 [employer].

## 2022-09-11 NOTE — Progress Notes (Signed)
 I connected with  Pasty Arch on 09/11/22 by a audio enabled telemedicine application and verified that I am speaking with the correct person using two identifiers.  Patient Location: Home  Provider Location: Home Office  I discussed the limitations of evaluation and management by telemedicine. The patient expressed understanding and agreed to proceed.  Due to this visit being a telehealth visit, certain criteria was not obtained, such a blood pressure, CBG if patient is a diabetic, and timed up and go.  Subjective:   [name] is a 67 y.o. female who presents for Medicare Annual (Subsequent) preventive examination.  Review of Systems     Cardiac Risk Factors include: advanced age (>43men, >3 women);dyslipidemia;hypertension     Objective:    Today's Vitals   09/11/22 1057  Weight: 184 lb (83.5 kg)  Height: 5' 6.5" (1.689 m)  PainSc: 0-No pain   Body mass index is 29.25 kg/m.     09/11/2022   11:04 AM 07/05/2022    3:48 PM 03/21/2022   10:31 AM 03/06/2018    9:29 AM 06/07/2012   12:56 PM  Advanced Directives  Does Patient Have a Medical Advance Directive? No No No No Patient does not have advance directive  Would patient like information on creating a medical advance directive? No - Patient declined No - Patient declined     Pre-existing out of facility DNR order (yellow form or pink MOST form)     No    Current Medications (verified) Outpatient Encounter Medications as of 09/11/2022  Medication Sig   hydrochlorothiazide (HYDRODIURIL) 25 MG tablet Take 1 tablet (25 mg total) by mouth daily.   pravastatin (PRAVACHOL) 40 MG tablet Take 1 tablet (40 mg total) by mouth daily.   ALOE VERA PO Take 1 Dose by mouth daily. (Patient not taking: Reported on 09/11/2022)   Apoaequorin (PREVAGEN PO) Take 1 capsule by mouth 2 (two) times a week. (Patient not taking: Reported on 09/11/2022)   Ascorbic Acid (VITAMIN C PO) Take 1 tablet by mouth daily. (Patient not taking:  Reported on 09/11/2022)   cetirizine (ZYRTEC) 10 MG tablet Take 1 tablet (10 mg total) by mouth daily. (Patient not taking: Reported on 09/11/2022)   chlorhexidine (PERIDEX) 0.12 % solution Use as directed 15 mLs in the mouth or throat 2 (two) times daily. (Patient not taking: Reported on 09/11/2022)   chlorhexidine (PERIDEX) 0.12 % solution Use a cap full and gargle after brushing teeth twice daily (Patient not taking: Reported on 09/11/2022)   COENZYME Q10 PO Take 1 capsule by mouth daily. (Patient not taking: Reported on 09/11/2022)   Cyanocobalamin (VITAMIN B 12 PO) Take by mouth daily. (Patient not taking: Reported on 09/11/2022)   dextromethorphan (TUSSIN COUGH) 15 MG/5ML syrup Take 10 mLs (30 mg total) by mouth 4 (four) times daily as needed for cough. (Patient not taking: Reported on 09/11/2022)   fluorometholone (FML) 0.1 % ophthalmic suspension Place 1 drop into the right eye 4 (four) times daily. (Patient not taking: Reported on 09/11/2022)   fluticasone (FLONASE) 50 MCG/ACT nasal spray PLACE 2 SPRAYS INTO BOTH NOSTRILS DAILY. (Patient not taking: Reported on 09/11/2022)   ibuprofen (ADVIL) 600 MG tablet Take 1 tablet (600 mg total) by mouth every 8 (eight) hours as needed. (Patient not taking: Reported on 09/11/2022)   imipramine (TOFRANIL) 10 MG tablet Take 1-2 tablets (10-20 mg total) by mouth at bedtime. (Patient not taking: Reported on 09/11/2022)   naproxen sodium (ALEVE) 220 MG tablet Take 220  mg by mouth daily as needed (pain.). (Patient not taking: Reported on 09/11/2022)   Omega-3 Fatty Acids (OMEGA 3 PO) Take 1 capsule by mouth daily. (Patient not taking: Reported on 09/11/2022)   oxyCODONE-acetaminophen (PERCOCET) 5-325 MG tablet Take 1 tablet by mouth every 4 (four) hours as needed. (Patient not taking: Reported on 09/11/2022)   potassium chloride SA (KLOR-CON M) 20 MEQ tablet Take 1 tablet (20 mEq total) by mouth daily. (Patient not taking: Reported on 09/11/2022)   prednisoLONE acetate  (PRED FORTE) 1 % ophthalmic suspension Instill 1 drop into affected eye four times a day (Patient not taking: Reported on 09/11/2022)   predniSONE (DELTASONE) 10 MG tablet Take 1 tablet (10 mg total) by mouth 4 (four) times daily.Follow instructions given in clinic. (Patient not taking: Reported on 09/11/2022)   VITAMIN D PO Take 1 tablet by mouth daily. (Patient not taking: Reported on 09/11/2022)   [DISCONTINUED] CHLORHEXIDINE GLUCONATE, BULK, SOLN Use a cap full and gargle after brushing teeth twice daily (Patient not taking: Reported on 03/20/2022)   No facility-administered encounter medications on file as of 09/11/2022.    Allergies (verified) Lisinopril   History: Past Medical History:  Diagnosis Date   Allergy    allergy , sinus issues   Anemia    when younger   Arthritis    HANDS,KNEES,"LITTLE BIT"   Hx of blood clots 04/04/1991   previously on Coumadin x 3 months in the past    Hyperlipidemia    on meds   Hypertension    on meds   Positive colorectal cancer screening using Cologuard test 03/02/2021   Past Surgical History:  Procedure Laterality Date   COLONOSCOPY     POLYPECTOMY     TOOTH EXTRACTION N/A 03/21/2022   Procedure: EXTRACTION ALL REMAINING TEETH NUMBER THREE, FIVE, SIX, SEVEN, EIGHT, NINE, TEN, ELEVEN, TWELVE, TWENTY ONE, TWENTY TWO, TWENTY THREE, TWENTY FOUR, TWENTY FIVE, TWENTY SIX, TWENTY SEVEN, TWENTY EIGHT, TWENTY NINE, THIRTY TWO, ALVEO LOPLASTY;  Surgeon: Ocie Doyne, DMD;  Location: [facility] OR;  Service: Oral Surgery;  Laterality: N/A;   tooth pulled with sedation     many years ago   Family History  Problem Relation Age of Onset   Breast cancer Neg Hx    Colon cancer Neg Hx    Colon polyps Neg Hx    Esophageal cancer Neg Hx    Rectal cancer Neg Hx    Stomach cancer Neg Hx    Crohn's disease Neg Hx    Social History   Socioeconomic History   Marital status: Married    Spouse name: Not on file   Number of children: Not on file   Years of  education: Not on file   Highest education level: Not on file  Occupational History   Not on file  Tobacco Use   Smoking status: Never    Passive exposure: Current   Smokeless tobacco: Never  Vaping Use   Vaping Use: Never used  Substance and Sexual Activity   Alcohol use: Yes    Alcohol/week: 2.0 standard drinks of alcohol    Types: 2 Cans of beer per week    Comment: occasionally   Drug use: No   Sexual activity: Not Currently  Other Topics Concern   Not on file  Social History Narrative   Not on file   Social Determinants of Health   Financial Resource Strain: Low Risk  (09/11/2022)   Overall Financial Resource Strain (CARDIA)    Difficulty of  Paying Living Expenses: Not hard at all  Food Insecurity: No Food Insecurity (09/11/2022)   Hunger Vital Sign    Worried About Running Out of Food in the Last Year: Never true    Ran Out of Food in the Last Year: Never true  Transportation Needs: No Transportation Needs (09/11/2022)   PRAPARE - Administrator, Civil Service (Medical): No    Lack of Transportation (Non-Medical): No  Physical Activity: Sufficiently Active (09/11/2022)   Exercise Vital Sign    Days of Exercise per Week: 7 days    Minutes of Exercise per Session: 30 min  Stress: No Stress Concern Present (09/11/2022)   [employer] of Occupational Health - Occupational Stress Questionnaire    Feeling of Stress : Not at all  Social Connections: Socially Integrated (09/11/2022)   Social Connection and Isolation Panel [NHANES]    Frequency of Communication with Friends and Family: More than three times a week    Frequency of Social Gatherings with Friends and Family: More than three times a week    Attends Religious Services: More than 4 times per year    Active Member of [employer] or Organizations: Yes    Attends Engineer, structural: More than 4 times per year    Marital Status: Married    Tobacco Counseling Counseling given: Yes   Clinical  Intake:  Pre-visit preparation completed: Yes  Pain : No/denies pain Pain Score: 0-No pain     BMI - recorded: 29.25 Nutritional Status: BMI 25 -29 Overweight Nutritional Risks: None Diabetes: No  How often do you need to have someone help you when you read instructions, pamphlets, or other written materials from your doctor or pharmacy?: 1 - Never  Diabetic?no  Interpreter Needed?: No  Information entered by ::  Hillard Goodwine, CMA   Activities of Daily Living    09/11/2022   11:04 AM 03/21/2022   10:46 AM  In your present state of health, do you have any difficulty performing the following activities:  Hearing? 0   Vision? 0   Difficulty concentrating or making decisions? 0   Walking or climbing stairs? 0   Dressing or bathing? 0   Doing errands, shopping? 0 0  Preparing Food and eating ? N   Using the Toilet? N   In the past six months, have you accidently leaked urine? N   Do you have problems with loss of bowel control? N   Managing your Medications? N   Managing your Finances? N   Housekeeping or managing your Housekeeping? N     Patient Care Team: Grayce Sessions, NP as PCP - General (Internal Medicine) Denny Levy (Physician Assistant)  Indicate any recent Medical Services you may have received from other than [facility] providers in the past year (date may be approximate).     Assessment:   This is a routine wellness examination for [facility].  Hearing/Vision screen Hearing Screening - Comments:: Patient denies any hearing difficulties.   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        Depression Screen    09/11/2022   11:02 AM 07/14/2022   10:29 AM 04/13/2022    3:43 PM 11/18/2021    8:44 AM 09/29/2021    3:16 PM 11/03/2020   10:53 AM 10/13/2020   10:35 AM  PHQ 2/9 Scores  PHQ - 2 Score 0 0 0 0 0 0 0  PHQ- 9 Score   0        Fall Risk    09/11/2022   11:04 AM 07/14/2022   10:16 AM 04/13/2022    3:26 PM 11/18/2021    8:44 AM 09/29/2021    3:16 PM  Fall Risk   Falls in the past year? 0 1 0 0 0  Number falls in past yr: 0 0 0    Injury with Fall? 0 1 0    Risk for fall due to : No Fall Risks Other (Comment)     Follow up Falls prevention discussed  Falls evaluation completed      FALL RISK PREVENTION PERTAINING TO THE HOME:  Any stairs in or around the home? No  If so, are there any without handrails? No  Home free of loose throw rugs in walkways, pet beds, electrical cords, etc? Yes  Adequate lighting in your home to reduce risk of falls? Yes   ASSISTIVE DEVICES UTILIZED TO PREVENT FALLS:  Life alert? No  Use of a cane, walker or w/c? No  Grab bars in the bathroom? No  Shower chair or bench in shower? No  Elevated toilet seat or a handicapped toilet? No   TIMED UP AND GO:  Was the test performed? No .   Cognitive Function:        09/11/2022   11:05 AM  6CIT Screen  What Year? 0 points  What month? 0 points  What time? 0 points  Count back from 20 0 points  Months in reverse 0 points  Repeat phrase 0 points  Total Score 0 points    Immunizations Immunization History  Administered Date(s) Administered   PFIZER(Purple Top)SARS-COV-2 Vaccination 05/31/2019, 06/21/2019   Pneumococcal Conjugate-13 10/10/2018   Tdap 10/10/2018    TDAP status: Up to date  Flu Vaccine status: Up to date  Pneumococcal vaccine status: Up to date  Covid-19 vaccine status: Information provided on how to obtain  vaccines.   Qualifies for Shingles Vaccine? Yes   Zostavax completed No   Shingrix Completed?: No.    Education has been provided regarding the importance of this vaccine. Patient has been advised to call insurance company to determine out of pocket expense if they have not yet received this vaccine. Advised may also receive vaccine at local pharmacy or Health Dept. Verbalized acceptance and understanding.  Screening Tests Health Maintenance  Topic Date Due   Zoster Vaccines- Shingrix (1 of 2) Never done   Diabetic kidney evaluation - Urine ACR  10/10/2019   DEXA SCAN  Never done   COVID-19 Vaccine (3 - 2023-24 season) 12/02/2021   HEMOGLOBIN A1C  05/21/2022   Pneumonia Vaccine 62+ Years old (2 of 2 - PPSV23 or PCV20) 09/30/2022 (Originally 06/21/2020)   INFLUENZA VACCINE  11/02/2022   FOOT EXAM  11/19/2022   COLON CANCER SCREENING ANNUAL FOBT  11/26/2022   Diabetic kidney evaluation - eGFR measurement  07/05/2023   OPHTHALMOLOGY EXAM  08/08/2023   Medicare Annual  Wellness (AWV)  09/11/2023   MAMMOGRAM  05/12/2024   DTaP/Tdap/Td (2 - Td or Tdap) 10/09/2028   Hepatitis C Screening  Completed   HPV VACCINES  Aged Out   Colonoscopy  Discontinued    Health Maintenance  Health Maintenance Due  Topic Date Due   Zoster Vaccines- Shingrix (1 of 2) Never done   Diabetic kidney evaluation - Urine ACR  10/10/2019   DEXA SCAN  Never done   COVID-19 Vaccine (3 - 2023-24 season) 12/02/2021   HEMOGLOBIN A1C  05/21/2022    Colorectal cancer screening: Type of screening: Colonoscopy. Completed 11/25/21. Repeat every 1 years  Mammogram status: Completed 05/12/2022. Repeat every year  Bone Density status: Ordered 04/13/2022. Pt provided with contact info and advised to call to schedule appt.  Lung Cancer Screening: (Low Dose CT Chest recommended if Age 56-80 years, 30 pack-year currently smoking OR have quit w/in 15years.) does not qualify.   Additional Screening:  Hepatitis C Screening:  does qualify; Completed 10/10/2018  Vision Screening: Recommended annual ophthalmology exams for early detection of glaucoma and other disorders of the eye. Is the patient up to date with their annual eye exam?  Yes  Who is the provider or what is the name of the office in which the patient attends annual eye exams? [facility] Dental Screening: Recommended annual dental exams for proper oral hygiene  Community Resource Referral / Chronic Care Management: CRR required this visit?  No   CCM required this visit?  No      Plan:     I have personally reviewed and noted the following in the patient's chart:   Medical and social history Use of alcohol, tobacco or illicit drugs  Current medications and supplements including opioid prescriptions. Patient is not currently taking opioid prescriptions. Functional ability and status Nutritional status Physical activity Advanced directives List of other physicians Hospitalizations, surgeries, and ER visits in previous 12 months Vitals Screenings to include cognitive, depression, and falls Referrals and appointments  In addition, I have reviewed and discussed with patient certain preventive protocols, quality metrics, and best practice recommendations. A written personalized care plan for preventive services as well as general preventive health recommendations were provided to patient.    Due to this being a telephonic visit, the after visit summary with patients personalized plan was offered to patient via mail or my-chart. Patient would like to access their AVS via my-chart   Jordan Hawks Perkins Molina, CMA   09/11/2022   Nurse Notes: Patient states she does not take any medications other than the two marked as taking. Does not take the opioid prescription.

## 2022-10-13 ENCOUNTER — Encounter (INDEPENDENT_AMBULATORY_CARE_PROVIDER_SITE_OTHER): Payer: Self-pay | Admitting: Primary Care

## 2022-10-13 ENCOUNTER — Ambulatory Visit (INDEPENDENT_AMBULATORY_CARE_PROVIDER_SITE_OTHER): Payer: 59 | Admitting: Primary Care

## 2022-10-13 VITALS — BP 130/88 | HR 75 | Resp 16 | Wt 187.0 lb

## 2022-10-13 DIAGNOSIS — I10 Essential (primary) hypertension: Secondary | ICD-10-CM

## 2022-10-13 DIAGNOSIS — E559 Vitamin D deficiency, unspecified: Secondary | ICD-10-CM

## 2022-10-13 DIAGNOSIS — E782 Mixed hyperlipidemia: Secondary | ICD-10-CM

## 2022-10-13 DIAGNOSIS — R7303 Prediabetes: Secondary | ICD-10-CM

## 2022-10-13 NOTE — Progress Notes (Signed)
[facility]  [name], is a 67 y.o. female  [phone]  ZYS:[id number]  DOB - 1956-01-14  Chief Complaint  Patient presents with   Hypertension       Subjective:   [name] is a 67 y.o. female here today for a follow up visit for HTN. Patient has No headache, No chest pain, No abdominal pain - No Nausea, No new weakness tingling or numbness, No Cough - shortness of breath. Prediabetes- Denies polyuria, polydipsia, polyphasia or vision changes.  Does not check blood sugars at home.   No problems updated.  Allergies  Allergen Reactions   Lisinopril     Suspect ACE inhibitor allergy as patient creatinine nearly doubled after initiation of lisinopril    Past Medical History:  Diagnosis Date   Allergy    allergy , sinus issues   Anemia    when younger   Arthritis    HANDS,KNEES,"LITTLE BIT"   Hx of blood clots 04/04/1991   previously on Coumadin x 3 months in the past    Hyperlipidemia    on meds   Hypertension    on meds   Positive colorectal cancer screening using Cologuard test 03/02/2021    Current Outpatient Medications on File Prior to Visit  Medication Sig Dispense Refill   ALOE VERA PO Take 1 Dose by mouth daily. (Patient not taking: Reported on 09/11/2022)     Apoaequorin (PREVAGEN PO) Take 1 capsule by mouth 2 (two) times a week. (Patient not taking: Reported on 09/11/2022)     Ascorbic Acid (VITAMIN C PO) Take 1 tablet by mouth daily. (Patient not taking: Reported on 09/11/2022)     cetirizine (ZYRTEC) 10 MG tablet Take 1 tablet (10 mg total) by mouth daily. (Patient not taking: Reported on 09/11/2022) 90 tablet 1   chlorhexidine (PERIDEX) 0.12 % solution Use as directed 15 mLs in the mouth or throat 2 (two) times daily. (Patient not taking: Reported on 09/11/2022) 473 mL 0   chlorhexidine (PERIDEX) 0.12 % solution Use a cap full and gargle after brushing teeth twice daily (Patient not taking: Reported on 09/11/2022) 473 mL 1   COENZYME  Q10 PO Take 1 capsule by mouth daily. (Patient not taking: Reported on 09/11/2022)     Cyanocobalamin (VITAMIN B 12 PO) Take by mouth daily. (Patient not taking: Reported on 09/11/2022)     dextromethorphan (TUSSIN COUGH) 15 MG/5ML syrup Take 10 mLs (30 mg total) by mouth 4 (four) times daily as needed for cough. (Patient not taking: Reported on 09/11/2022) 120 mL 0   fluorometholone (FML) 0.1 % ophthalmic suspension Place 1 drop into the right eye 4 (four) times daily. (Patient not taking: Reported on 09/11/2022) 5 mL 0   fluticasone (FLONASE) 50 MCG/ACT nasal spray PLACE 2 SPRAYS INTO BOTH NOSTRILS DAILY. (Patient not taking: Reported on 09/11/2022) 16 g 2   hydrochlorothiazide (HYDRODIURIL) 25 MG tablet Take 1 tablet (25 mg total) by mouth daily. 100 tablet 0   ibuprofen (ADVIL) 600 MG tablet Take 1 tablet (600 mg total) by mouth every 8 (eight) hours as needed. (Patient not taking: Reported on 09/11/2022) 60 tablet 1   imipramine (TOFRANIL) 10 MG tablet Take 1-2 tablets (10-20 mg total) by mouth at bedtime. (Patient not taking: Reported on 09/11/2022) 60 tablet 1   naproxen sodium (ALEVE) 220 MG tablet Take 220 mg by mouth daily as needed (pain.). (Patient not taking: Reported on 09/11/2022)     Omega-3 Fatty Acids (OMEGA 3 PO) Take 1  capsule by mouth daily. (Patient not taking: Reported on 09/11/2022)     oxyCODONE-acetaminophen (PERCOCET) 5-325 MG tablet Take 1 tablet by mouth every 4 (four) hours as needed. (Patient not taking: Reported on 09/11/2022) 30 tablet 0   potassium chloride SA (KLOR-CON M) 20 MEQ tablet Take 1 tablet (20 mEq total) by mouth daily. (Patient not taking: Reported on 09/11/2022) 30 tablet 3   pravastatin (PRAVACHOL) 40 MG tablet Take 1 tablet (40 mg total) by mouth daily. 90 tablet 1   prednisoLONE acetate (PRED FORTE) 1 % ophthalmic suspension Instill 1 drop into affected eye four times a day (Patient not taking: Reported on 09/11/2022) 10 mL 1   predniSONE (DELTASONE) 10 MG tablet  Take 1 tablet (10 mg total) by mouth 4 (four) times daily.Follow instructions given in clinic. (Patient not taking: Reported on 09/11/2022) 40 tablet 1   VITAMIN D PO Take 1 tablet by mouth daily. (Patient not taking: Reported on 09/11/2022)     [DISCONTINUED] CHLORHEXIDINE GLUCONATE, BULK, SOLN Use a cap full and gargle after brushing teeth twice daily (Patient not taking: Reported on 03/20/2022) 4000 mL 1   No current facility-administered medications on file prior to visit.    Objective:   Vitals:   10/13/22 0848 10/13/22 0849  BP: (Abnormal) 157/96 (Abnormal) 140/90  Pulse: 75   Resp: 16   SpO2: 100%   Weight: 187 lb (84.8 kg)     Comprehensive ROS Pertinent positive and negative noted in HPI   Exam General appearance : Awake, alert, not in any distress. Speech Clear. Not toxic looking HEENT: Atraumatic and Normocephalic, pupils equally reactive to light and accomodation Neck: Supple, no JVD. No cervical lymphadenopathy.  Chest: Good air entry bilaterally, no added sounds  CVS: S1 S2 regular, no murmurs.  Abdomen: Bowel sounds present, Non tender and not distended with no gaurding, rigidity or rebound. Extremities: B/L Lower Ext shows no edema, both legs are warm to touch Neurology: Awake alert, and oriented X 3, CN II-XII intact, Non focal Skin: No Rash  Data Review Lab Results  Component Value Date   HGBA1C 5.6 11/18/2021   HGBA1C 5.9 (H) 04/30/2020   HGBA1C 5.6 10/10/2018    Assessment & Plan  Genna was seen today for hypertension.  Diagnoses and all orders for this visit:  Essential hypertension BP goal - < 140/90 - 130/80  DIET: Limit salt intake, read nutrition labels to check salt content, limit fried and high fatty foods  Avoid using multisymptom OTC cold preparations that generally contain sudafed which can rise BP. Consult with pharmacist on best cold relief products to use for persons with HTN EXERCISE Discussed incorporating exercise such as walking - 30  minutes most days of the week and can do in 10 minute intervals    -     Comprehensive metabolic panel  Mixed hyperlipidemia  Healthy lifestyle diet of fruits vegetables fish nuts whole grains and low saturated fat . Foods high in cholesterol or liver, fatty meats,cheese, butter avocados, nuts and seeds, chocolate and fried foods.  -     Lipid panel  Prediabetes - educated on lifestyle modifications, including but not limited to diet choices and adding exercise to daily routine.   -     Hemoglobin A1c -     CBC with Differential/Platelet -     Microalbumin / creatinine urine ratio  Vitamin D deficiency -     VITAMIN D 25 Hydroxy (Vit-D Deficiency, Fractures)  Patient have been counseled extensively  about nutrition and exercise. Other issues discussed during this visit include: low cholesterol diet, weight control and daily exercise, foot care, annual eye examinations at Ophthalmology, importance of adherence with medications and regular follow-up. We also discussed long term complications of uncontrolled diabetes and hypertension.   No follow-ups on file.  The patient was given clear instructions to go to ER or return to medical center if symptoms don't improve, worsen or new problems develop. The patient verbalized understanding. The patient was told to call to get lab results if they haven't heard anything in the next week.   This note has been created with Education officer, environmental. Any transcriptional errors are unintentional.   Grayce Sessions, NP 10/13/2022, 9:01 AM

## 2022-10-13 NOTE — Patient Instructions (Addendum)
Calorie Counting for Weight Loss Calories are units of energy. Your body needs a certain number of calories from food to keep going throughout the day. When you eat or drink more calories than your body needs, your body stores the extra calories mostly as fat. When you eat or drink fewer calories than your body needs, your body burns fat to get the energy it needs. Calorie counting means keeping track of how many calories you eat and drink each day. Calorie counting can be helpful if you need to lose weight. If you eat fewer calories than your body needs, you should lose weight. Ask your health care provider what a healthy weight is for you. For calorie counting to work, you will need to eat the right number of calories each day to lose a healthy amount of weight per week. A dietitian can help you figure out how many calories you need in a day and will suggest ways to reach your calorie goal. A healthy amount of weight to lose each week is usually 1-2 lb (0.5-0.9 kg). This usually means that your daily calorie intake should be reduced by 500-750 calories. Eating 1,200-1,500 calories a day can help most women lose weight. Eating 1,500-1,800 calories a day can help most men lose weight. What do I need to know about calorie counting? Work with your health care provider or dietitian to determine how many calories you should get each day. To meet your daily calorie goal, you will need to: Find out how many calories are in each food that you would like to eat. Try to do this before you eat. Decide how much of the food you plan to eat. Keep a food log. Do this by writing down what you ate and how many calories it had. To successfully lose weight, it is important to balance calorie counting with a healthy lifestyle that includes regular activity. Where do I find calorie information?  The number of calories in a food can be found on a Nutrition Facts label. If a food does not have a Nutrition Facts label, try  to look up the calories online or ask your dietitian for help. Remember that calories are listed per serving. If you choose to have more than one serving of a food, you will have to multiply the calories per serving by the number of servings you plan to eat. For example, the label on a package of bread might say that a serving size is 1 slice and that there are 90 calories in a serving. If you eat 1 slice, you will have eaten 90 calories. If you eat 2 slices, you will have eaten 180 calories. How do I keep a food log? After each time that you eat, record the following in your food log as soon as possible: What you ate. Be sure to include toppings, sauces, and other extras on the food. How much you ate. This can be measured in cups, ounces, or number of items. How many calories were in each food and drink. The total number of calories in the food you ate. Keep your food log near you, such as in a pocket-sized notebook or on an app or website on your mobile phone. Some programs will calculate calories for you and show you how many calories you have left to meet your daily goal. What are some portion-control tips? Know how many calories are in a serving. This will help you know how many servings you can have of a certain  food. Use a measuring cup to measure serving sizes. You could also try weighing out portions on a kitchen scale. With time, you will be able to estimate serving sizes for some foods. Take time to put servings of different foods on your favorite plates or in your favorite bowls and cups so you know what a serving looks like. Try not to eat straight from a food's packaging, such as from a bag or box. Eating straight from the package makes it hard to see how much you are eating and can lead to overeating. Put the amount you would like to eat in a cup or on a plate to make sure you are eating the right portion. Use smaller plates, glasses, and bowls for smaller portions and to prevent  overeating. Try not to multitask. For example, avoid watching TV or using your computer while eating. If it is time to eat, sit down at a table and enjoy your food. This will help you recognize when you are full. It will also help you be more mindful of what and how much you are eating. What are tips for following this plan? Reading food labels Check the calorie count compared with the serving size. The serving size may be smaller than what you are used to eating. Check the source of the calories. Try to choose foods that are high in protein, fiber, and vitamins, and low in saturated fat, trans fat, and sodium. Shopping Read nutrition labels while you shop. This will help you make healthy decisions about which foods to buy. Pay attention to nutrition labels for low-fat or fat-free foods. These foods sometimes have the same number of calories or more calories than the full-fat versions. They also often have added sugar, starch, or salt to make up for flavor that was removed with the fat. Make a grocery list of lower-calorie foods and stick to it. Cooking Try to cook your favorite foods in a healthier way. For example, try baking instead of frying. Use low-fat dairy products. Meal planning Use more fruits and vegetables. One-half of your plate should be fruits and vegetables. Include lean proteins, such as chicken, [place], and fish. Lifestyle Each week, aim to do one of the following: 150 minutes of moderate exercise, such as walking. 75 minutes of vigorous exercise, such as running. General information Know how many calories are in the foods you eat most often. This will help you calculate calorie counts faster. Find a way of tracking calories that works for you. Get creative. Try different apps or programs if writing down calories does not work for you. What foods should I eat?  Eat nutritious foods. It is better to have a nutritious, high-calorie food, such as an avocado, than a food with  few nutrients, such as a bag of potato chips. Use your calories on foods and drinks that will fill you up and will not leave you hungry soon after eating. Examples of foods that fill you up are nuts and nut butters, vegetables, lean proteins, and high-fiber foods such as whole grains. High-fiber foods are foods with more than 5 g of fiber per serving. Pay attention to calories in drinks. Low-calorie drinks include water and unsweetened drinks. The items listed above may not be a complete list of foods and beverages you can eat. Contact a dietitian for more information. What foods should I limit? Limit foods or drinks that are not good sources of vitamins, minerals, or protein or that are high in unhealthy fats. These  include: Candy. Other sweets. Sodas, specialty coffee drinks, alcohol, and juice. The items listed above may not be a complete list of foods and beverages you should avoid. Contact a dietitian for more information. How do I count calories when eating out? Pay attention to portions. Often, portions are much larger when eating out. Try these tips to keep portions smaller: Consider sharing a meal instead of getting your own. If you get your own meal, eat only half of it. Before you start eating, ask for a container and put half of your meal into it. When available, consider ordering smaller portions from the menu instead of full portions. Pay attention to your food and drink choices. Knowing the way food is cooked and what is included with the meal can help you eat fewer calories. If calories are listed on the menu, choose the lower-calorie options. Choose dishes that include vegetables, fruits, whole grains, low-fat dairy products, and lean proteins. Choose items that are boiled, broiled, grilled, or steamed. Avoid items that are buttered, battered, fried, or served with cream sauce. Items labeled as crispy are usually fried, unless stated otherwise. Choose water, low-fat milk,  unsweetened iced tea, or other drinks without added sugar. If you want an alcoholic beverage, choose a lower-calorie option, such as a glass of wine or light beer. Ask for dressings, sauces, and syrups on the side. These are usually high in calories, so you should limit the amount you eat. If you want a salad, choose a garden salad and ask for grilled meats. Avoid extra toppings such as bacon, cheese, or fried items. Ask for the dressing on the side, or ask for olive oil and vinegar or lemon to use as dressing. Estimate how many servings of a food you are given. Knowing serving sizes will help you be aware of how much food you are eating at restaurants. Where to find more information Centers for Disease Control and Prevention: FootballExhibition.com.br U.S. Department of Agriculture: WrestlingReporter.dk Summary Calorie counting means keeping track of how many calories you eat and drink each day. If you eat fewer calories than your body needs, you should lose weight. A healthy amount of weight to lose per week is usually 1-2 lb (0.5-0.9 kg). This usually means reducing your daily calorie intake by 500-750 calories. The number of calories in a food can be found on a Nutrition Facts label. If a food does not have a Nutrition Facts label, try to look up the calories online or ask your dietitian for help. Use smaller plates, glasses, and bowls for smaller portions and to prevent overeating. Use your calories on foods and drinks that will fill you up and not leave you hungry shortly after a meal. This information is not intended to replace advice given to you by your health care provider. Make sure you discuss any questions you have with your health care provider. Document Revised: 05/01/2019 Document Reviewed: 05/01/2019 Elsevier Patient Education  2023 [employer] Inc. Shingles  Shingles is an infection. It gives you a painful skin rash and blisters that have fluid in them. Shingles is caused by the same germ (virus) that  causes chickenpox. Shingles only happens in people who: Have had chickenpox. Have been given a shot (vaccine) to protect against chickenpox. Shingles is rare in this group. What are the causes? This condition is caused by varicella-zoster virus. This is the same germ that causes chickenpox. After a person is exposed to the germ, the germ stays in the body but is not active (  dormant). Shingles develops if the germ becomes active again (is reactivated). This can happen many years after the first exposure to the germ. It is not known what causes this germ to become active again. What increases the risk? People who have had chickenpox or received the chickenpox shot are at risk for shingles. This infection is more common in people who: Are older than 67 years of age. Have a weakened disease-fighting system (immune system), such as people with: HIV (human immunodeficiency virus). AIDS (acquired immunodeficiency syndrome). Cancer. Are taking medicines that weaken the immune system, such as organ transplant medicines. Have a lot of stress. What are the signs or symptoms? The first symptoms of shingles may be itching, tingling, or pain in an area on your skin. A rash will show on your skin a few days or weeks later. This is what usually happens: The rash is likely to be on one side of your body. The rash usually has a shape like a belt or a band. Over time, the rash turns into fluid-filled blisters. The blisters will break open and change into scabs. The scabs usually dry up in about 2-3 weeks. You may also have: A fever. Chills. A headache. A feeling like you may vomit (nausea). How is this treated? The rash may last for several weeks. There is not a specific cure for this condition. Your doctor may prescribe medicines. Medicines may: Help with pain. Help you get better sooner. Help to prevent long-term problems. Help with itching (antihistamines). If the area involved is on your face,  you may need to see a specialist. This may be an eye doctor or an ear, nose, and throat (ENT) doctor. Follow these instructions at home: Medicines Take over-the-counter and prescription medicines only as told by your doctor. Put on an anti-itch cream or numbing cream where you have a rash, blisters, or scabs. Do this as told by your doctor. Helping with itching and discomfort  Put cold, wet cloths (cold compresses) on the area of the rash or blisters as told by your doctor. Cool baths can help you feel better. Try adding baking soda or dry oatmeal to the water to lessen itching. Do not bathe in hot water. Use calamine lotion as told by your doctor. Blister and rash care Keep your rash covered with a loose bandage (dressing). Wear loose clothing that does not rub on your rash. Wash your hands with soap and water for at least 20 seconds before and after you change your bandage. If you cannot use soap and water, use hand sanitizer. Change your bandage as told by your doctor. Keep your rash and blisters clean. To do this, wash the area with mild soap and cool water as told by your doctor. Check your rash every day for signs of infection. Check for: More redness, swelling, or pain. Fluid or blood. Warmth. Pus or a bad smell. Do not scratch your rash. Do not pick at your blisters. To help you to not scratch: Keep your fingernails clean and cut short. Wear gloves or mittens when you sleep, if scratching is a problem. General instructions Rest as told by your doctor. Wash your hands often with soap and water for at least 20 seconds. If you cannot use soap and water, use hand sanitizer. Doing this lowers your chance of getting a skin infection. Your infection can cause chickenpox in people who have never had chickenpox or never got a chickenpox vaccine shot. If you have blisters that did not change into  scabs yet, try not to touch other people or be around other people,  especially: Babies. Pregnant women. Children who have areas of red, itchy, or rough skin (eczema). Older people who have organ transplants. People who have a long-term (chronic) illness, like cancer or AIDS. Keep all follow-up visits. How is this prevented? A vaccine shot is the best way to prevent shingles and protect against shingles problems. If you have not had a vaccine shot, talk with your doctor about getting it. Where to find more information Centers for Disease Control and Prevention: FootballExhibition.com.br Contact a doctor if: Your pain does not get better with medicine. Your pain does not get better after the rash heals. You have any of these signs of infection around the rash: More redness, swelling, or pain. Fluid or blood. Warmth. Pus or a bad smell. You have a fever. Get help right away if: The rash is on your face or nose. You have pain in your face or pain by your eye. You lose feeling on one side of your face. You have trouble seeing. You have ear pain, or you have ringing in your ear. You have a loss of taste. Your condition gets worse. Summary Shingles gives you a painful skin rash and blisters that have fluid in them. Shingles is caused by the same germ (virus) that causes chickenpox. Keep your rash covered with a loose bandage. Wear loose clothing that does not rub on your rash. If you have blisters that did not change into scabs yet, try not to touch other people or be around people. This information is not intended to replace advice given to you by your health care provider. Make sure you discuss any questions you have with your health care provider. Document Revised: 03/15/2020 Document Reviewed: 03/15/2020 Elsevier Patient Education  2024 [employer].

## 2022-10-14 LAB — CBC WITH DIFFERENTIAL/PLATELET
Basophils Absolute: 0 10*3/uL (ref 0.0–0.2)
Basos: 1 %
EOS (ABSOLUTE): 0.1 10*3/uL (ref 0.0–0.4)
Eos: 3 %
Hematocrit: 39.2 % (ref 34.0–46.6)
Hemoglobin: 13.1 g/dL (ref 11.1–15.9)
Immature Grans (Abs): 0 10*3/uL (ref 0.0–0.1)
Immature Granulocytes: 1 %
Lymphocytes Absolute: 2 10*3/uL (ref 0.7–3.1)
Lymphs: 52 %
MCH: 30.9 pg (ref 26.6–33.0)
MCHC: 33.4 g/dL (ref 31.5–35.7)
MCV: 93 fL (ref 79–97)
Monocytes Absolute: 0.4 10*3/uL (ref 0.1–0.9)
Monocytes: 9 %
Neutrophils Absolute: 1.3 10*3/uL — ABNORMAL LOW (ref 1.4–7.0)
Neutrophils: 34 %
Platelets: 206 10*3/uL (ref 150–450)
RBC: 4.24 x10E6/uL (ref 3.77–5.28)
RDW: 12.7 % (ref 11.7–15.4)
WBC: 3.8 10*3/uL (ref 3.4–10.8)

## 2022-10-14 LAB — COMPREHENSIVE METABOLIC PANEL
ALT: 16 IU/L (ref 0–32)
AST: 18 IU/L (ref 0–40)
Albumin: 4.4 g/dL (ref 3.9–4.9)
Alkaline Phosphatase: 47 IU/L (ref 44–121)
BUN/Creatinine Ratio: 22 (ref 12–28)
BUN: 17 mg/dL (ref 8–27)
Bilirubin Total: 0.3 mg/dL (ref 0.0–1.2)
CO2: 27 mmol/L (ref 20–29)
Calcium: 9.7 mg/dL (ref 8.7–10.3)
Chloride: 101 mmol/L (ref 96–106)
Creatinine, Ser: 0.76 mg/dL (ref 0.57–1.00)
Globulin, Total: 2.9 g/dL (ref 1.5–4.5)
Glucose: 88 mg/dL (ref 70–99)
Potassium: 3.9 mmol/L (ref 3.5–5.2)
Sodium: 143 mmol/L (ref 134–144)
Total Protein: 7.3 g/dL (ref 6.0–8.5)
eGFR: 86 mL/min/{1.73_m2} (ref >59)

## 2022-10-14 LAB — HEMOGLOBIN A1C
Est. average glucose Bld gHb Est-mCnc: 120 mg/dL
Hgb A1c MFr Bld: 5.8 % — ABNORMAL HIGH (ref 4.8–5.6)

## 2022-10-14 LAB — LIPID PANEL
Chol/HDL Ratio: 4 ratio (ref 0.0–4.4)
Cholesterol, Total: 230 mg/dL — ABNORMAL HIGH (ref 100–199)
HDL: 58 mg/dL (ref >39)
LDL Chol Calc (NIH): 149 mg/dL — ABNORMAL HIGH (ref 0–99)
Triglycerides: 127 mg/dL (ref 0–149)
VLDL Cholesterol Cal: 23 mg/dL (ref 5–40)

## 2022-10-14 LAB — MICROALBUMIN / CREATININE URINE RATIO
Creatinine, Urine: 179.6 mg/dL
Microalb/Creat Ratio: 5 mg/g creat (ref 0–29)
Microalbumin, Urine: 8.1 ug/mL

## 2022-10-14 LAB — VITAMIN D 25 HYDROXY (VIT D DEFICIENCY, FRACTURES): Vit D, 25-Hydroxy: 41.7 ng/mL (ref 30.0–100.0)

## 2022-10-21 ENCOUNTER — Other Ambulatory Visit (INDEPENDENT_AMBULATORY_CARE_PROVIDER_SITE_OTHER): Payer: Self-pay | Admitting: Primary Care

## 2022-10-21 MED ORDER — ATORVASTATIN CALCIUM 40 MG PO TABS
40.0000 mg | ORAL_TABLET | Freq: Every day | ORAL | 3 refills | Status: DC
  Filled 2022-10-21: qty 90, 90d supply, fill #0
  Filled 2023-02-01: qty 90, 90d supply, fill #1

## 2022-10-23 ENCOUNTER — Other Ambulatory Visit: Payer: Self-pay

## 2022-10-30 ENCOUNTER — Ambulatory Visit (INDEPENDENT_AMBULATORY_CARE_PROVIDER_SITE_OTHER): Payer: Self-pay

## 2022-10-30 NOTE — Telephone Encounter (Signed)
Pt called in wants to know the side effects of atorvastatin (LIPITOR) 40 MG tablet. She said she didn't get one when she picked it up.    Chief Complaint: Asking side effects of Lipitor. Verbalizes understanding. Symptoms: n/a Frequency: n/a Pertinent Negatives: Patient denies n/a Disposition: [] ED /[] Urgent Care (no appt availability in office) / [] Appointment(In office/virtual)/ []  [place] [facility]/ [x] Home Care/ [] Refused Recommended Disposition /[] [place] [facility]/ []  Follow-up with PCP Additional Notes: Pt. Verbalizes understanding of possible side effects - diarrhea, muscle and joint aches.  Reason for Disposition  Caller has medicine question only, adult not sick, AND triager answers question  Answer Assessment - Initial Assessment Questions 1. NAME of MEDICINE: "What medicine(s) are you calling about?"     Lipitor 2. QUESTION: "What is your question?" (e.g., double dose of medicine, side effect)     What are the side effects 3. PRESCRIBER: "Who prescribed the medicine?" Reason: if prescribed by specialist, call should be referred to that group.     [facility] 4. SYMPTOMS: "Do you have any symptoms?" If Yes, ask: "What symptoms are you having?"  "How bad are the symptoms (e.g., mild, moderate, severe)     N/a 5. PREGNANCY:  "Is there any chance that you are pregnant?" "When was your last menstrual period?"     No  Protocols used: Medication Question Call-A-AH

## 2022-11-01 ENCOUNTER — Other Ambulatory Visit (INDEPENDENT_AMBULATORY_CARE_PROVIDER_SITE_OTHER): Payer: Self-pay | Admitting: Primary Care

## 2022-11-01 DIAGNOSIS — I10 Essential (primary) hypertension: Secondary | ICD-10-CM

## 2022-11-01 DIAGNOSIS — Z760 Encounter for issue of repeat prescription: Secondary | ICD-10-CM

## 2022-11-02 ENCOUNTER — Other Ambulatory Visit: Payer: Self-pay

## 2022-11-02 MED ORDER — HYDROCHLOROTHIAZIDE 25 MG PO TABS
25.0000 mg | ORAL_TABLET | Freq: Every day | ORAL | 0 refills | Status: DC
Start: 2022-11-02 — End: 2023-02-01
  Filled 2022-11-02: qty 100, 100d supply, fill #0

## 2022-11-02 NOTE — Telephone Encounter (Signed)
Requested Prescriptions  Pending Prescriptions Disp Refills   hydrochlorothiazide (HYDRODIURIL) 25 MG tablet 100 tablet 0    Sig: Take 1 tablet (25 mg total) by mouth daily.     Cardiovascular: Diuretics - Thiazide Passed - 11/01/2022  4:27 PM      Passed - Cr in normal range and within 180 days    Creat  Date Value Ref Range Status  07/01/2012 1.15 (H) 0.50 - 1.10 mg/dL Final   Creatinine, Ser  Date Value Ref Range Status  10/13/2022 0.76 0.57 - 1.00 mg/dL Final         Passed - K in normal range and within 180 days    Potassium  Date Value Ref Range Status  10/13/2022 3.9 3.5 - 5.2 mmol/L Final         Passed - Na in normal range and within 180 days    Sodium  Date Value Ref Range Status  10/13/2022 143 134 - 144 mmol/L Final         Passed - Last BP in normal range    BP Readings from Last 1 Encounters:  10/13/22 130/88         Passed - Valid encounter within last 6 months    Recent Outpatient Visits           2 weeks ago Essential hypertension   [place] [facility] Grayce Sessions, NP   3 months ago Allergy, initial encounter   [place] [facility] Grayce Sessions, NP   5 months ago Acute URI   [place] [facility] Grayce Sessions, NP   6 months ago Decreased estrogen level   [place] [facility] Grayce Sessions, NP   9 months ago Dental caries   [place] [facility] Grayce Sessions, NP       Future Appointments             In 2 months Randa Evens, Kinnie Scales, NP [place] [facility]

## 2022-11-03 ENCOUNTER — Other Ambulatory Visit: Payer: Self-pay

## 2023-01-15 ENCOUNTER — Ambulatory Visit (INDEPENDENT_AMBULATORY_CARE_PROVIDER_SITE_OTHER): Payer: 59 | Admitting: Primary Care

## 2023-01-26 ENCOUNTER — Encounter (INDEPENDENT_AMBULATORY_CARE_PROVIDER_SITE_OTHER): Payer: Self-pay | Admitting: Primary Care

## 2023-01-26 ENCOUNTER — Ambulatory Visit (INDEPENDENT_AMBULATORY_CARE_PROVIDER_SITE_OTHER): Payer: 59 | Admitting: Primary Care

## 2023-01-26 VITALS — BP 132/88 | HR 60 | Resp 16 | Ht 66.5 in | Wt 188.6 lb

## 2023-01-26 DIAGNOSIS — E559 Vitamin D deficiency, unspecified: Secondary | ICD-10-CM

## 2023-01-26 DIAGNOSIS — Z760 Encounter for issue of repeat prescription: Secondary | ICD-10-CM

## 2023-01-26 DIAGNOSIS — E782 Mixed hyperlipidemia: Secondary | ICD-10-CM | POA: Diagnosis not present

## 2023-01-26 DIAGNOSIS — I10 Essential (primary) hypertension: Secondary | ICD-10-CM | POA: Diagnosis not present

## 2023-01-26 DIAGNOSIS — E2839 Other primary ovarian failure: Secondary | ICD-10-CM

## 2023-01-26 DIAGNOSIS — Z2821 Immunization not carried out because of patient refusal: Secondary | ICD-10-CM | POA: Diagnosis not present

## 2023-01-26 NOTE — Progress Notes (Unsigned)
[facility]  [name], is a 67 y.o. female  NUU:[id number]  HKV:[id number]  DOB - [birth year]  Chief Complaint  Patient presents with   Hypertension       Subjective:   [name] is a 67 y.o. female here today for a follow up visit. Patient has No headache, No chest pain, No abdominal pain - No Nausea, No new weakness tingling or numbness, No Cough - shortness of breath  No problems updated.  Allergies  Allergen Reactions   Lisinopril     Suspect ACE inhibitor allergy as patient creatinine nearly doubled after initiation of lisinopril    Past Medical History:  Diagnosis Date   Allergy    allergy , sinus issues   Anemia    when younger   Arthritis    HANDS,KNEES,"LITTLE BIT"   Hx of blood clots 04/04/1991   previously on Coumadin x 3 months in the past    Hyperlipidemia    on meds   Hypertension    on meds   Positive colorectal cancer screening using Cologuard test 03/02/2021    Current Outpatient Medications on File Prior to Visit  Medication Sig Dispense Refill   atorvastatin (LIPITOR) 40 MG tablet Take 1 tablet (40 mg total) by mouth daily. 90 tablet 3   hydrochlorothiazide (HYDRODIURIL) 25 MG tablet Take 1 tablet (25 mg total) by mouth daily. 100 tablet 0   [DISCONTINUED] CHLORHEXIDINE GLUCONATE, BULK, SOLN Use a cap full and gargle after brushing teeth twice daily (Patient not taking: Reported on 03/20/2022) 4000 mL 1   No current facility-administered medications on file prior to visit.    Objective:   Vitals:   01/26/23 1009  BP: 132/88  Pulse: 60  Resp: 16  SpO2: 100%  Weight: 188 lb 9.6 oz (85.5 kg)  Height: 5' 6.5" (1.689 m)   Health Maintenance  Topic Date Due   Zoster Vaccines- Shingrix (1 of 2) Never done   DEXA SCAN  Never done   FOOT EXAM  11/19/2022   COLON CANCER SCREENING ANNUAL FOBT  11/26/2022   COVID-19 Vaccine (3 - 2023-24 season) 12/03/2022   INFLUENZA VACCINE  07/02/2023 (Originally 11/02/2022)   Pneumonia  Vaccine 48+ Years old (2 of 2 - PPSV23 or PCV20) 01/26/2024 (Originally 06/21/2020)   HEMOGLOBIN A1C  04/15/2023   OPHTHALMOLOGY EXAM  08/08/2023   Medicare Annual Wellness (AWV)  09/11/2023   Diabetic kidney evaluation - Urine ACR  10/13/2023   Diabetic kidney evaluation - eGFR measurement  01/26/2024   MAMMOGRAM  05/12/2024   DTaP/Tdap/Td (2 - Td or Tdap) 10/09/2028   Hepatitis C Screening  Completed   HPV VACCINES  Aged Out   Colonoscopy  Discontinued     Comprehensive ROS Pertinent positive and negative noted in HPI   Exam General appearance : Awake, alert, not in any distress. Speech Clear. Not toxic looking HEENT: Atraumatic and Normocephalic, pupils equally reactive to light and accomodation Neck: Supple, no JVD. No cervical lymphadenopathy.  Chest: Good air entry bilaterally, no added sounds  CVS: S1 S2 regular, no murmurs.  Abdomen: Bowel sounds present, Non tender and not distended with no gaurding, rigidity or rebound. Extremities: B/L Lower Ext shows no edema, both legs are warm to touch Neurology: Awake alert, and oriented X 3, CN II-XII intact, Non focal Skin: No Rash  Data Review Lab Results  Component Value Date   HGBA1C 5.8 (H) 10/13/2022   HGBA1C 5.6 11/18/2021   HGBA1C 5.9 (H) 04/30/2020  Assessment & Plan  [name] was seen today for hypertension.  Diagnoses and all orders for this visit:  Influenza vaccination declined  Pneumococcal vaccination declined  Herpes zoster vaccination declined  Mixed hyperlipidemia  Healthy lifestyle diet of fruits vegetables fish nuts whole grains and low saturated fat . Foods high in cholesterol or liver, fatty meats,cheese, butter avocados, nuts and seeds, chocolate and fried foods.  Essential hypertension Controlled BP goal - met Explained that having normal blood pressure is the goal and medications are helping to get to goal and maintain normal blood pressure. DIET: Limit salt intake, read nutrition labels to  check salt content, limit fried and high fatty foods  Avoid using multisymptom OTC cold preparations that generally contain sudafed which can rise BP. Consult with pharmacist on best cold relief products to use for persons with HTN EXERCISE Discussed incorporating exercise such as walking - 30 minutes most days of the week and can do in 10 minute intervals     Decreased estrogen level -     DG Bone Density; Future      Patient have been counseled extensively about nutrition and exercise. Other issues discussed during this visit include: low cholesterol diet, weight control and daily exercise, foot care, annual eye examinations at Ophthalmology, importance of adherence with medications and regular follow-up. We also discussed long term complications of uncontrolled diabetes and hypertension.   Return in about 3 months (around 04/28/2023).  The patient was given clear instructions to go to ER or return to medical center if symptoms don't improve, worsen or new problems develop. The patient verbalized understanding. The patient was told to call to get lab results if they haven't heard anything in the next week.   This note has been created with Education officer, environmental. Any transcriptional errors are unintentional.   Grayce Sessions, NP 01/29/2023, 12:01 AM

## 2023-01-27 LAB — CMP14+EGFR
ALT: 22 [IU]/L (ref 0–32)
AST: 25 [IU]/L (ref 0–40)
Albumin: 4.5 g/dL (ref 3.9–4.9)
Alkaline Phosphatase: 57 [IU]/L (ref 44–121)
BUN/Creatinine Ratio: 19 (ref 12–28)
BUN: 15 mg/dL (ref 8–27)
Bilirubin Total: 0.6 mg/dL (ref 0.0–1.2)
CO2: 25 mmol/L (ref 20–29)
Calcium: 9.4 mg/dL (ref 8.7–10.3)
Chloride: 98 mmol/L (ref 96–106)
Creatinine, Ser: 0.78 mg/dL (ref 0.57–1.00)
Globulin, Total: 3.2 g/dL (ref 1.5–4.5)
Glucose: 92 mg/dL (ref 70–99)
Potassium: 3.6 mmol/L (ref 3.5–5.2)
Sodium: 139 mmol/L (ref 134–144)
Total Protein: 7.7 g/dL (ref 6.0–8.5)
eGFR: 83 mL/min/{1.73_m2} (ref >59)

## 2023-01-27 LAB — LIPID PANEL
Chol/HDL Ratio: 4 ratio (ref 0.0–4.4)
Cholesterol, Total: 219 mg/dL — ABNORMAL HIGH (ref 100–199)
HDL: 55 mg/dL (ref >39)
LDL Chol Calc (NIH): 142 mg/dL — ABNORMAL HIGH (ref 0–99)
Triglycerides: 125 mg/dL (ref 0–149)
VLDL Cholesterol Cal: 22 mg/dL (ref 5–40)

## 2023-01-27 LAB — VITAMIN D 25 HYDROXY (VIT D DEFICIENCY, FRACTURES): Vit D, 25-Hydroxy: 43.8 ng/mL (ref 30.0–100.0)

## 2023-01-29 ENCOUNTER — Encounter (INDEPENDENT_AMBULATORY_CARE_PROVIDER_SITE_OTHER): Payer: Self-pay | Admitting: Primary Care

## 2023-02-01 ENCOUNTER — Other Ambulatory Visit: Payer: Self-pay

## 2023-02-01 ENCOUNTER — Other Ambulatory Visit (INDEPENDENT_AMBULATORY_CARE_PROVIDER_SITE_OTHER): Payer: Self-pay | Admitting: Primary Care

## 2023-02-01 ENCOUNTER — Telehealth: Payer: Self-pay | Admitting: Primary Care

## 2023-02-01 DIAGNOSIS — I10 Essential (primary) hypertension: Secondary | ICD-10-CM

## 2023-02-01 DIAGNOSIS — Z760 Encounter for issue of repeat prescription: Secondary | ICD-10-CM

## 2023-02-01 MED ORDER — HYDROCHLOROTHIAZIDE 25 MG PO TABS
25.0000 mg | ORAL_TABLET | Freq: Every day | ORAL | 0 refills | Status: DC
Start: 2023-02-01 — End: 2023-05-25
  Filled 2023-02-01: qty 100, 100d supply, fill #0

## 2023-02-01 NOTE — Telephone Encounter (Signed)
PT calling in returning a call from the office.

## 2023-02-01 NOTE — Telephone Encounter (Signed)
Requested Prescriptions  Pending Prescriptions Disp Refills   hydrochlorothiazide (HYDRODIURIL) 25 MG tablet 100 tablet 0    Sig: Take 1 tablet (25 mg total) by mouth daily.     Cardiovascular: Diuretics - Thiazide Passed - 02/01/2023 11:38 AM      Passed - Cr in normal range and within 180 days    Creat  Date Value Ref Range Status  07/01/2012 1.15 (H) 0.50 - 1.10 mg/dL Final   Creatinine, Ser  Date Value Ref Range Status  01/26/2023 0.78 0.57 - 1.00 mg/dL Final         Passed - K in normal range and within 180 days    Potassium  Date Value Ref Range Status  01/26/2023 3.6 3.5 - 5.2 mmol/L Final         Passed - Na in normal range and within 180 days    Sodium  Date Value Ref Range Status  01/26/2023 139 134 - 144 mmol/L Final         Passed - Last BP in normal range    BP Readings from Last 1 Encounters:  01/26/23 132/88         Passed - Valid encounter within last 6 months    Recent Outpatient Visits           6 days ago Influenza vaccination declined   [place] [facility] Grayce Sessions, NP   3 months ago Essential hypertension   [place] [facility] Grayce Sessions, NP   6 months ago Allergy, initial encounter   [place] [facility] Grayce Sessions, NP   8 months ago Acute URI   [place] [facility] Grayce Sessions, NP   9 months ago Decreased estrogen level   [place] [facility] Grayce Sessions, NP       Future Appointments             In 2 months Randa Evens, Kinnie Scales, NP [place] [facility]

## 2023-02-01 NOTE — Telephone Encounter (Signed)
Returned pt call to get more information and why she was taken out of work and who took her out pt didn't answer lvm for a return call back

## 2023-02-01 NOTE — Telephone Encounter (Signed)
Copied from CRM [id number]. Topic: General - Other >> Feb 01, 2023  1:54 PM Franchot Heidelberg wrote: Reason for CRM: Pt called requesting a doctor's note that allows her to return to work on Tuesday. She experienced some symptoms that got her taken out of work but she feels much better and needs a doctor's note to return.

## 2023-02-02 ENCOUNTER — Other Ambulatory Visit (INDEPENDENT_AMBULATORY_CARE_PROVIDER_SITE_OTHER): Payer: Self-pay | Admitting: Primary Care

## 2023-02-02 ENCOUNTER — Encounter (INDEPENDENT_AMBULATORY_CARE_PROVIDER_SITE_OTHER): Payer: Self-pay | Admitting: Primary Care

## 2023-02-02 ENCOUNTER — Other Ambulatory Visit: Payer: Self-pay

## 2023-02-02 MED ORDER — ATORVASTATIN CALCIUM 20 MG PO TABS
20.0000 mg | ORAL_TABLET | Freq: Every day | ORAL | 1 refills | Status: DC
  Filled 2023-02-02: qty 90, 90d supply, fill #0

## 2023-02-02 NOTE — Telephone Encounter (Signed)
Returned pt call this morning. Pt is requesting a note to return back to work. Pt states her symptoms started Tuesday which the night before she was coughing all night. Pt states she was having headaches, back and leg pain. Pt states she was taking aspirin pain reliever. Made pt aware that provider wouldn't be able to provide note because the provider didn't take her out of work. Pt states she has never heard of that. I informed pt that when her symptoms started if she would have reached out to this then we would be able to provide some documnetation but because she didn't we are unable to.  Pt is requesting a call back from provider

## 2023-02-05 ENCOUNTER — Other Ambulatory Visit: Payer: Self-pay

## 2023-03-25 IMAGING — MG MM DIGITAL SCREENING BILAT W/ TOMO AND CAD
6 of 12 series · 6 of 36 positions shown · non-contrast
Comparison: None.

CLINICAL DATA: Screening.

EXAM:
DIGITAL SCREENING BILATERAL MAMMOGRAM WITH TOMOSYNTHESIS AND CAD
TECHNIQUE: Bilateral screening digital craniocaudal and mediolateral oblique
mammograms were obtained. Bilateral screening digital breast
tomosynthesis was performed. The images were evaluated with
computer-aided detection.

[R CC synth-2D (1 of 2)]
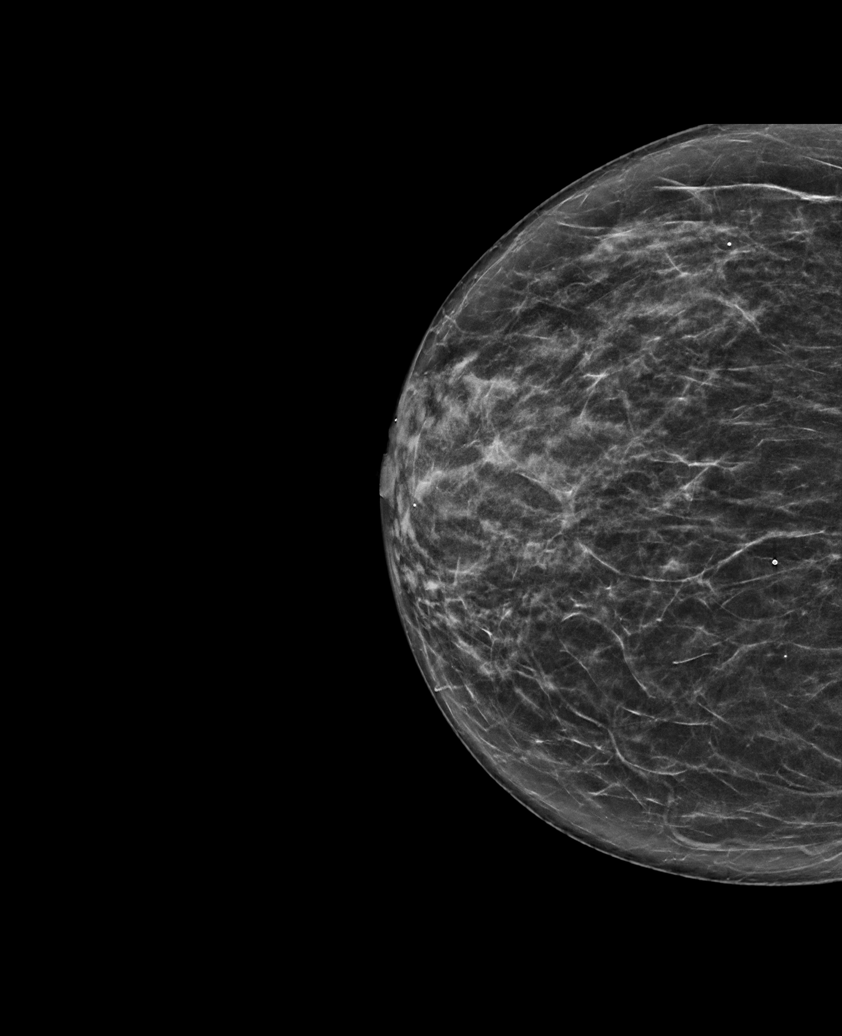

[L CC synth-2D (1 of 2)]
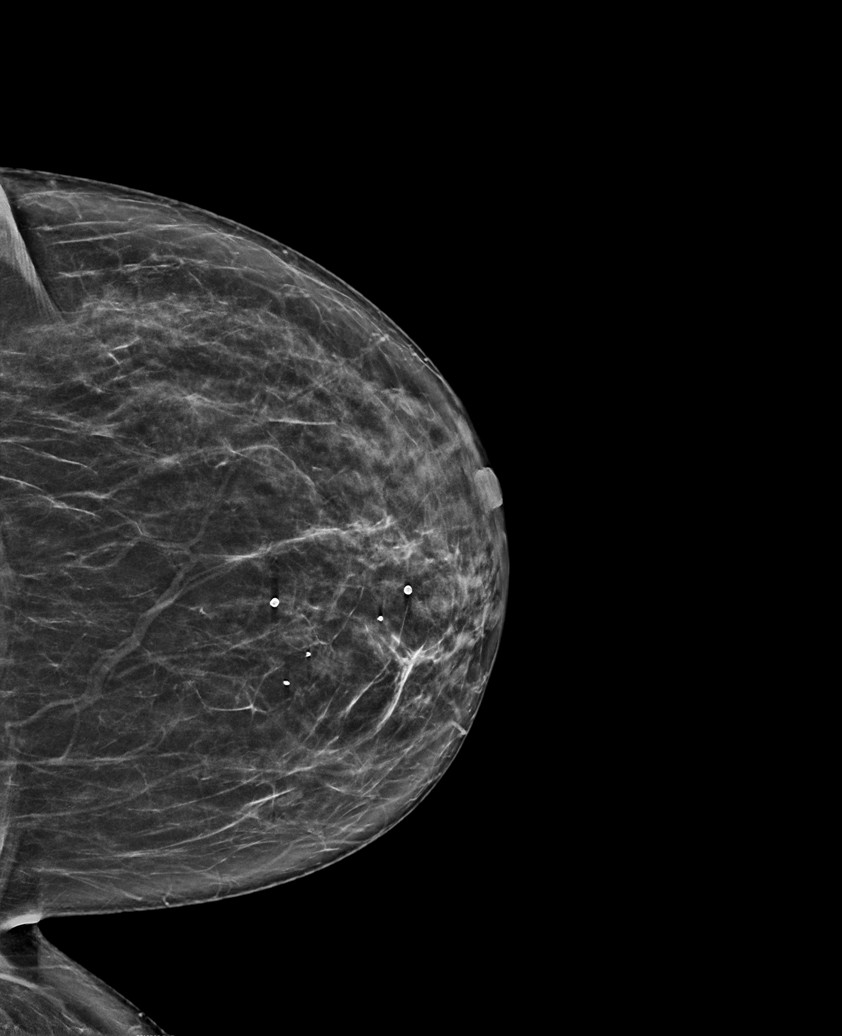

[R MLO synth-2D]
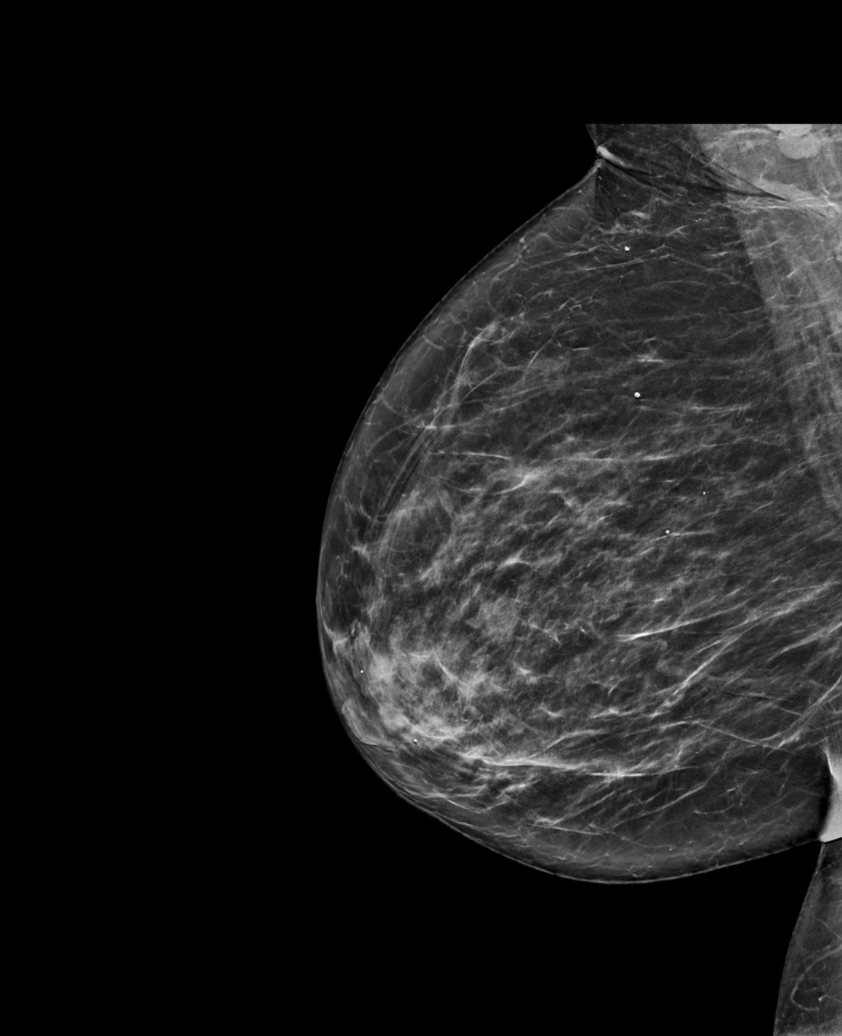

[L MLO synth-2D]
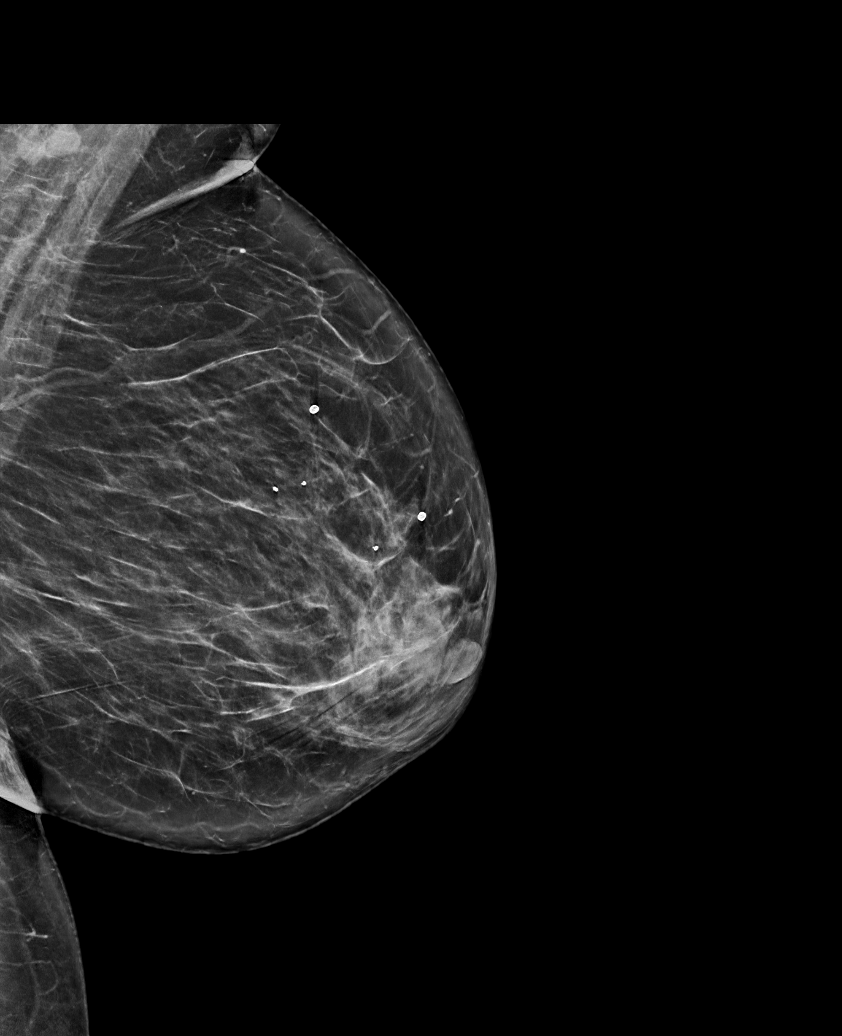

[L CC synth-2D (2 of 2)]
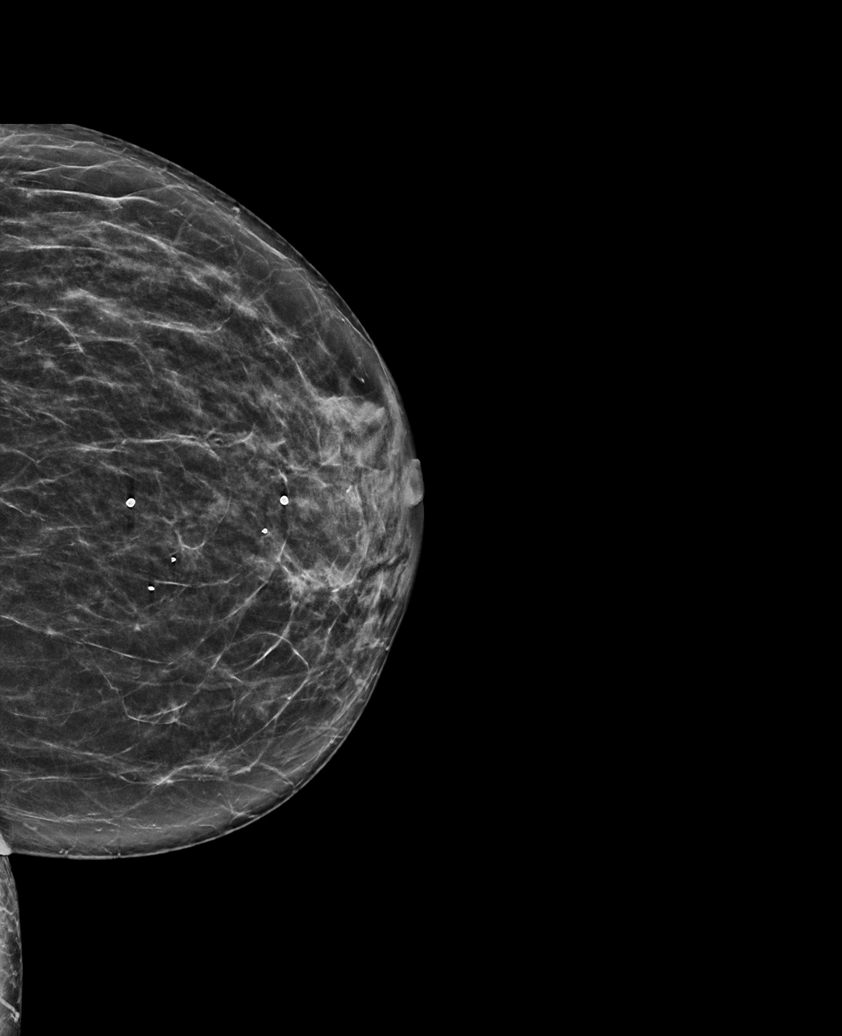

[R CC synth-2D (2 of 2)]
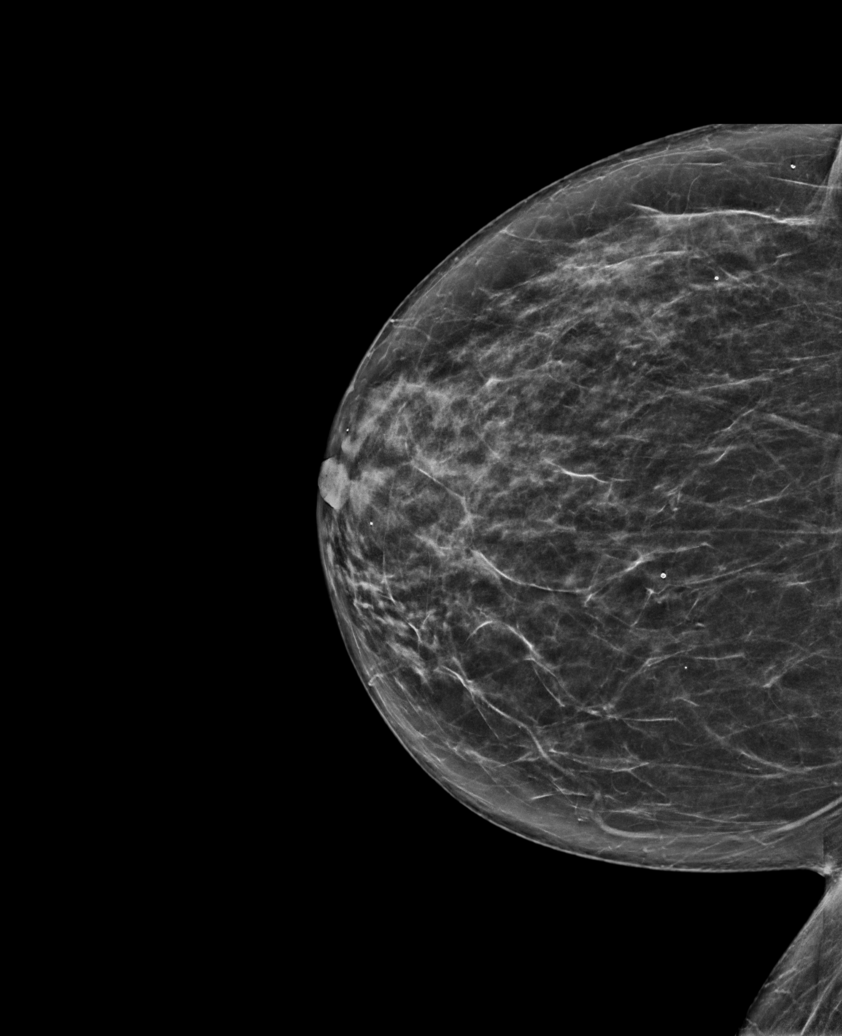

[6 of 36 positions shown; findings below may reference images not displayed]

Baseline

ACR Breast Density Category b: There are scattered areas of
fibroglandular density.
FINDINGS: There are no findings suspicious for malignancy.
IMPRESSION: No mammographic evidence of malignancy. A result letter of this
screening mammogram will be mailed directly to the patient.

RECOMMENDATION:
Screening mammogram in one year. (Code:[id number]-U-3L7)

BI-RADS CATEGORY  1: Negative.

## 2023-04-20 DIAGNOSIS — H40013 Open angle with borderline findings, low risk, bilateral: Secondary | ICD-10-CM | POA: Diagnosis not present

## 2023-04-20 DIAGNOSIS — H5789 Other specified disorders of eye and adnexa: Secondary | ICD-10-CM | POA: Diagnosis not present

## 2023-04-20 DIAGNOSIS — H04123 Dry eye syndrome of bilateral lacrimal glands: Secondary | ICD-10-CM | POA: Diagnosis not present

## 2023-04-20 DIAGNOSIS — H524 Presbyopia: Secondary | ICD-10-CM | POA: Diagnosis not present

## 2023-04-20 DIAGNOSIS — H40052 Ocular hypertension, left eye: Secondary | ICD-10-CM | POA: Diagnosis not present

## 2023-04-30 ENCOUNTER — Other Ambulatory Visit: Payer: Self-pay | Admitting: Primary Care

## 2023-04-30 ENCOUNTER — Ambulatory Visit (INDEPENDENT_AMBULATORY_CARE_PROVIDER_SITE_OTHER): Payer: 59 | Admitting: Primary Care

## 2023-04-30 ENCOUNTER — Encounter (INDEPENDENT_AMBULATORY_CARE_PROVIDER_SITE_OTHER): Payer: Self-pay | Admitting: Primary Care

## 2023-04-30 VITALS — BP 131/87 | HR 68 | Resp 16 | Ht 66.5 in | Wt 189.4 lb

## 2023-04-30 DIAGNOSIS — Z1231 Encounter for screening mammogram for malignant neoplasm of breast: Secondary | ICD-10-CM

## 2023-04-30 DIAGNOSIS — R7303 Prediabetes: Secondary | ICD-10-CM | POA: Diagnosis not present

## 2023-04-30 DIAGNOSIS — Z2821 Immunization not carried out because of patient refusal: Secondary | ICD-10-CM

## 2023-04-30 DIAGNOSIS — I10 Essential (primary) hypertension: Secondary | ICD-10-CM | POA: Diagnosis not present

## 2023-04-30 DIAGNOSIS — E782 Mixed hyperlipidemia: Secondary | ICD-10-CM | POA: Diagnosis not present

## 2023-04-30 NOTE — Progress Notes (Signed)
[facility]  Tess Potts, is a 68 y.o. female  [id number]  [id number]  DOB - [birth year]  Chief Complaint  Patient presents with   Hypertension       Subjective:   Rosie Torrez is a 68 y.o. female here today for a follow up visit management of HTN.. Patient has No headache, No chest pain, No abdominal pain - No Nausea, No new weakness tingling or numbness, No Cough - shortness of breath Hypertension    No problems updated.  Comprehensive ROS Pertinent positive and negative noted in HPI   Allergies  Allergen Reactions   Lisinopril     Suspect ACE inhibitor allergy as patient creatinine nearly doubled after initiation of lisinopril    Past Medical History:  Diagnosis Date   Allergy    allergy , sinus issues   Anemia    when younger   Arthritis    HANDS,KNEES,"LITTLE BIT"   Hx of blood clots 04/04/1991   previously on Coumadin x 3 months in the past    Hyperlipidemia    on meds   Hypertension    on meds   Positive colorectal cancer screening using Cologuard test 03/02/2021    Current Outpatient Medications on File Prior to Visit  Medication Sig Dispense Refill   atorvastatin (LIPITOR) 20 MG tablet Take 1 tablet (20 mg total) by mouth daily. 90 tablet 1   hydrochlorothiazide (HYDRODIURIL) 25 MG tablet Take 1 tablet (25 mg total) by mouth daily. 100 tablet 0   [DISCONTINUED] CHLORHEXIDINE GLUCONATE, BULK, SOLN Use a cap full and gargle after brushing teeth twice daily (Patient not taking: Reported on 03/20/2022) 4000 mL 1   No current facility-administered medications on file prior to visit.   Health Maintenance  Topic Date Due   DEXA scan (bone density measurement)  Never done   Stool Blood Test  11/26/2022   COVID-19 Vaccine (3 - 2024-25 season) 12/03/2022   Hemoglobin A1C  04/15/2023   Flu Shot  07/02/2023*   Zoster (Shingles) Vaccine (1 of 2) 07/29/2023*   Pneumonia Vaccine (2 of 2 - PPSV23 or PCV20) 01/26/2024*   Eye exam for  diabetics  08/08/2023   Medicare Annual Wellness Visit  09/11/2023   Yearly kidney health urinalysis for diabetes  10/13/2023   Yearly kidney function blood test for diabetes  01/26/2024   Complete foot exam   04/29/2024   Mammogram  05/12/2024   DTaP/Tdap/Td vaccine (2 - Td or Tdap) 10/09/2028   Hepatitis C Screening  Completed   HPV Vaccine  Aged Out   Colon Cancer Screening  Discontinued  *Topic was postponed. The date shown is not the original due date.    Objective:   Vitals:   04/30/23 1041  BP: 131/87  Pulse: 68  Resp: 16  SpO2: 97%  Weight: 189 lb 6.4 oz (85.9 kg)  Height: 5' 6.5" (1.689 m)   BP Readings from Last 3 Encounters:  04/30/23 131/87  01/26/23 132/88  10/13/22 130/88      Physical Exam Vitals reviewed.  Constitutional:      Appearance: Normal appearance. She is obese.  HENT:     Head: Normocephalic.     Right Ear: Tympanic membrane, ear canal and external ear normal.     Left Ear: Tympanic membrane, ear canal and external ear normal.     Nose: Nose normal.     Mouth/Throat:     Mouth: Mucous membranes are moist.  Eyes:     Extraocular Movements: Extraocular movements  intact.     Pupils: Pupils are equal, round, and reactive to light.  Cardiovascular:     Rate and Rhythm: Normal rate.  Pulmonary:     Effort: Pulmonary effort is normal.     Breath sounds: Normal breath sounds.  Abdominal:     General: Bowel sounds are normal.     Palpations: Abdomen is soft.  Musculoskeletal:        General: Normal range of motion.     Cervical back: Normal range of motion.  Skin:    General: Skin is warm and dry.  Neurological:     Mental Status: She is alert and oriented to person, place, and time.  Psychiatric:        Mood and Affect: Mood normal.        Behavior: Behavior normal.        Thought Content: Thought content normal.     Assessment & Plan  Taliya was seen today for hypertension.  Diagnoses and all orders for this visit:  Essential  hypertension Bp well controlled  <140/90 BP goal -  DIET: Limit salt intake, read nutrition labels to check salt content, limit fried and high fatty foods  Avoid using multisymptom OTC cold preparations that generally contain sudafed which can rise BP. Consult with pharmacist on best cold relief products to use for persons with HTN EXERCISE Discussed incorporating exercise such as walking - 30 minutes most days of the week and can do in 10 minute intervals     Herpes zoster vaccination declined      Patient have been counseled extensively about nutrition and exercise. Other issues discussed during this visit include: low cholesterol diet, weight control and daily exercise, foot care, annual eye examinations at Ophthalmology, importance of adherence with medications and regular follow-up. We also discussed long term complications of uncontrolled diabetes and hypertension.   Return in about 6 months (around 10/28/2023).  The patient was given clear instructions to go to ER or return to medical center if symptoms don't improve, worsen or new problems develop. The patient verbalized understanding. The patient was told to call to get lab results if they haven't heard anything in the next week.   This note has been created with Education officer, environmental. Any transcriptional errors are unintentional.   Grayce Sessions, NP 04/30/2023, 11:07 AM

## 2023-05-04 ENCOUNTER — Ambulatory Visit (INDEPENDENT_AMBULATORY_CARE_PROVIDER_SITE_OTHER): Payer: 59

## 2023-05-04 DIAGNOSIS — Z2821 Immunization not carried out because of patient refusal: Secondary | ICD-10-CM | POA: Diagnosis not present

## 2023-05-04 DIAGNOSIS — R7303 Prediabetes: Secondary | ICD-10-CM

## 2023-05-04 DIAGNOSIS — I10 Essential (primary) hypertension: Secondary | ICD-10-CM

## 2023-05-04 DIAGNOSIS — E782 Mixed hyperlipidemia: Secondary | ICD-10-CM

## 2023-05-05 LAB — CBC WITH DIFFERENTIAL/PLATELET
Basophils Absolute: 0.1 10*3/uL (ref 0.0–0.2)
Basos: 1 %
EOS (ABSOLUTE): 0.1 10*3/uL (ref 0.0–0.4)
Eos: 4 %
Hematocrit: 42 % (ref 34.0–46.6)
Hemoglobin: 14.1 g/dL (ref 11.1–15.9)
Immature Grans (Abs): 0 10*3/uL (ref 0.0–0.1)
Immature Granulocytes: 0 %
Lymphocytes Absolute: 1.9 10*3/uL (ref 0.7–3.1)
Lymphs: 53 %
MCH: 30.7 pg (ref 26.6–33.0)
MCHC: 33.6 g/dL (ref 31.5–35.7)
MCV: 92 fL (ref 79–97)
Monocytes Absolute: 0.3 10*3/uL (ref 0.1–0.9)
Monocytes: 7 %
Neutrophils Absolute: 1.3 10*3/uL — ABNORMAL LOW (ref 1.4–7.0)
Neutrophils: 35 %
Platelets: 234 10*3/uL (ref 150–450)
RBC: 4.59 x10E6/uL (ref 3.77–5.28)
RDW: 12.4 % (ref 11.7–15.4)
WBC: 3.7 10*3/uL (ref 3.4–10.8)

## 2023-05-05 LAB — CMP14+EGFR
ALT: 19 [IU]/L (ref 0–32)
AST: 19 [IU]/L (ref 0–40)
Albumin: 4.5 g/dL (ref 3.9–4.9)
Alkaline Phosphatase: 65 [IU]/L (ref 44–121)
BUN/Creatinine Ratio: 17 (ref 12–28)
BUN: 14 mg/dL (ref 8–27)
Bilirubin Total: 0.3 mg/dL (ref 0.0–1.2)
CO2: 21 mmol/L (ref 20–29)
Calcium: 9.4 mg/dL (ref 8.7–10.3)
Chloride: 101 mmol/L (ref 96–106)
Creatinine, Ser: 0.84 mg/dL (ref 0.57–1.00)
Globulin, Total: 2.9 g/dL (ref 1.5–4.5)
Glucose: 139 mg/dL — ABNORMAL HIGH (ref 70–99)
Potassium: 3.8 mmol/L (ref 3.5–5.2)
Sodium: 143 mmol/L (ref 134–144)
Total Protein: 7.4 g/dL (ref 6.0–8.5)
eGFR: 76 mL/min/{1.73_m2} (ref >59)

## 2023-05-05 LAB — LIPID PANEL
Chol/HDL Ratio: 4.1 {ratio} (ref 0.0–4.4)
Cholesterol, Total: 220 mg/dL — ABNORMAL HIGH (ref 100–199)
HDL: 54 mg/dL (ref >39)
LDL Chol Calc (NIH): 135 mg/dL — ABNORMAL HIGH (ref 0–99)
Triglycerides: 176 mg/dL — ABNORMAL HIGH (ref 0–149)
VLDL Cholesterol Cal: 31 mg/dL (ref 5–40)

## 2023-05-05 LAB — HEMOGLOBIN A1C
Est. average glucose Bld gHb Est-mCnc: 120 mg/dL
Hgb A1c MFr Bld: 5.8 % — ABNORMAL HIGH (ref 4.8–5.6)

## 2023-05-07 ENCOUNTER — Encounter (INDEPENDENT_AMBULATORY_CARE_PROVIDER_SITE_OTHER): Payer: Self-pay | Admitting: Primary Care

## 2023-05-07 ENCOUNTER — Other Ambulatory Visit (INDEPENDENT_AMBULATORY_CARE_PROVIDER_SITE_OTHER): Payer: Self-pay | Admitting: Primary Care

## 2023-05-07 DIAGNOSIS — E782 Mixed hyperlipidemia: Secondary | ICD-10-CM

## 2023-05-07 MED ORDER — ATORVASTATIN CALCIUM 40 MG PO TABS
40.0000 mg | ORAL_TABLET | Freq: Every day | ORAL | 1 refills | Status: DC
  Filled 2023-05-07: qty 90, 90d supply, fill #0
  Filled 2023-08-02: qty 90, 90d supply, fill #1

## 2023-05-08 ENCOUNTER — Other Ambulatory Visit: Payer: Self-pay

## 2023-05-18 ENCOUNTER — Ambulatory Visit: Payer: 59

## 2023-05-18 ENCOUNTER — Ambulatory Visit
Admission: RE | Admit: 2023-05-18 | Discharge: 2023-05-18 | Disposition: A | Discharge status: Home / Self Care | Payer: 59 | Source: Ambulatory Visit | Attending: Primary Care | Admitting: Primary Care

## 2023-05-18 DIAGNOSIS — Z1231 Encounter for screening mammogram for malignant neoplasm of breast: Secondary | ICD-10-CM | POA: Diagnosis not present

## 2023-05-23 ENCOUNTER — Other Ambulatory Visit (INDEPENDENT_AMBULATORY_CARE_PROVIDER_SITE_OTHER): Payer: Self-pay | Admitting: Primary Care

## 2023-05-23 DIAGNOSIS — I10 Essential (primary) hypertension: Secondary | ICD-10-CM

## 2023-05-23 DIAGNOSIS — Z760 Encounter for issue of repeat prescription: Secondary | ICD-10-CM

## 2023-05-25 ENCOUNTER — Other Ambulatory Visit: Payer: Self-pay

## 2023-05-25 ENCOUNTER — Other Ambulatory Visit (INDEPENDENT_AMBULATORY_CARE_PROVIDER_SITE_OTHER): Payer: Self-pay | Admitting: Primary Care

## 2023-05-25 DIAGNOSIS — I10 Essential (primary) hypertension: Secondary | ICD-10-CM

## 2023-05-25 DIAGNOSIS — Z760 Encounter for issue of repeat prescription: Secondary | ICD-10-CM

## 2023-05-25 MED ORDER — HYDROCHLOROTHIAZIDE 25 MG PO TABS
25.0000 mg | ORAL_TABLET | Freq: Every day | ORAL | 0 refills | Status: DC
Start: 2023-05-25 — End: 2023-09-04
  Filled 2023-05-25: qty 100, 100d supply, fill #0

## 2023-05-25 NOTE — Telephone Encounter (Signed)
 Will forward to provider

## 2023-05-28 ENCOUNTER — Telehealth (INDEPENDENT_AMBULATORY_CARE_PROVIDER_SITE_OTHER): Payer: Self-pay | Admitting: Primary Care

## 2023-05-28 ENCOUNTER — Other Ambulatory Visit: Payer: Self-pay

## 2023-05-28 ENCOUNTER — Telehealth (INDEPENDENT_AMBULATORY_CARE_PROVIDER_SITE_OTHER): Payer: 59

## 2023-05-28 ENCOUNTER — Ambulatory Visit (INDEPENDENT_AMBULATORY_CARE_PROVIDER_SITE_OTHER): Payer: Self-pay | Admitting: Primary Care

## 2023-05-28 ENCOUNTER — Encounter (INDEPENDENT_AMBULATORY_CARE_PROVIDER_SITE_OTHER): Payer: Self-pay | Admitting: Primary Care

## 2023-05-28 VITALS — BP 112/87 | HR 79 | Ht 66.5 in | Wt 183.0 lb

## 2023-05-28 DIAGNOSIS — M545 Low back pain, unspecified: Secondary | ICD-10-CM | POA: Diagnosis not present

## 2023-05-28 MED ORDER — IBUPROFEN 600 MG PO TABS
600.0000 mg | ORAL_TABLET | Freq: Three times a day (TID) | ORAL | 0 refills | Status: AC | PRN
  Filled 2023-05-28: qty 15, 5d supply, fill #0

## 2023-05-28 MED ORDER — METHOCARBAMOL 750 MG PO TABS
750.0000 mg | ORAL_TABLET | Freq: Three times a day (TID) | ORAL | 0 refills | Status: AC | PRN
  Filled 2023-05-28: qty 15, 5d supply, fill #0

## 2023-05-28 NOTE — Telephone Encounter (Signed)
 Chief Complaint: Back pain Symptoms: Pain radiation into left hip Frequency: Intermittent Pertinent Negatives: Patient denies numbness Disposition: [] ED /[] Urgent Care (no appt availability in office) / [x] Appointment(In office/virtual)/ []  [place] [facility]/ [] Home Care/ [] Refused Recommended Disposition /[] [facility]/ []  Follow-up with PCP Additional Notes: Pt states her back pain started on Saturday when she went to stand up. Pt states it feels similar to when she pulled her back a couple years ago. Pt states the pain is intermittent and she has not taken any OTC pain medications. Pt scheduled for a virtual appointment today for back pain. This RN educated pt on home care, new-worsening symptoms, when to call back/seek emergent care. Pt verbalized understanding and agrees to plan.   Copied from CRM [id number]. Topic: Clinical - Red Word Triage >> May 28, 2023 10:18 AM [name] wrote: Red Word that prompted transfer to Nurse Triage: Back, pt cannot bend down, back is falling out. dont know the reason why. When Stand up, cant walk real far. cant pick things. Reason for Disposition  [1] SEVERE back pain (e.g., excruciating, unable to do any normal activities) AND [2] not improved 2 hours after pain medicine  Answer Assessment - Initial Assessment Questions 1. ONSET: "When did the pain begin?"      Saturday 2. LOCATION: "Where does it hurt?" (upper, mid or lower back)     Left side of back into hip 3. SEVERITY: "How bad is the pain?"  (e.g., Scale 1-10; mild, moderate, or severe)   - MILD (1-3): Doesn't interfere with normal activities.    - MODERATE (4-7): Interferes with normal activities or awakens from sleep.    - SEVERE (8-10): Excruciating pain, unable to do any normal activities.      9 when it happens 4. PATTERN: "Is the pain constant?" (e.g., yes, no; constant, intermittent)      Intermittent, when the pain hits it causes her to "jump" 5. RADIATION: "Does the pain  shoot into your legs or somewhere else?"     Left side of hip 6. CAUSE:  "What do you think is causing the back pain?"      Not sure, similar to when pulled back a couple years ago 7. BACK OVERUSE:  "Any recent lifting of heavy objects, strenuous work or exercise?"     Denies 8. MEDICINES: "What have you taken so far for the pain?" (e.g., nothing, acetaminophen, NSAIDS)     Denies 9. NEUROLOGIC SYMPTOMS: "Do you have any weakness, numbness, or problems with bowel/bladder control?"     Denies 10. OTHER SYMPTOMS: "Do you have any other symptoms?" (e.g., fever, abdomen pain, burning with urination, blood in urine)       Denies  Protocols used: Back Pain-A-AH

## 2023-05-28 NOTE — Telephone Encounter (Signed)
 Medication was sent to pharmacy  Copied from CRM [id number]. Topic: Clinical - Prescription Issue >> May 28, 2023  4:00 PM Phill Myron wrote: Reason for CRM: ibuprofen (ADVIL) 600 MG tablet and methocarbamol (ROBAXIN) 750 MG tablet Patient calling for the status of her medication, called pharmacy and they said it was ready and patient can come a pick it up

## 2023-05-28 NOTE — Assessment & Plan Note (Signed)
 Acute Muscle Spasm in Lower Back [name], a 68 year old female, presents with acute left lower back pain radiating to her left leg, likely due to a muscle spasm.  The pain is associated with stiffness and significant discomfort.   Discussed ibuprofen and methocarbamol for symptom relief, emphasizing the importance of taking medications with food and water to avoid gastrointestinal issues.   Advised heat application for muscle relaxation and core strengthening exercises to prevent recurrence.   Instructed to seek in-person evaluation if severe pain, significant tingling, numbness, or shooting pain down the leg occurs.   - Prescribe ibuprofen for 5 days, up to three times daily with food and water  - Prescribe methocarbamol, up to three times daily as needed  - Advise heat application to the affected area  - Recommend core strengthening exercises once pain subsides  - Instruct to seek in-person evaluation if severe pain, significant tingling, numbness, or shooting pain down the leg occurs  - Send prescriptions to [facility]   - Provide work excuse letter until May 30, 2023, with potential return on May 31, 2023, if improved   Hypertension [name] is currently on hydrochlorothiazide. No changes to her current management were discussed.   Hyperlipidemia [name] is currently on Lipitor 40 mg. No changes to her current management were discussed.

## 2023-05-28 NOTE — Progress Notes (Signed)
 Virtual Visit via Video Note   This visit type was conducted per patient request This format is felt to be appropriate for this patient at this time.  All issues noted in this document were discussed and addressed.  A limited physical exam was performed with this format.  A verbal consent was obtained for the virtual visit.   Date:  05/28/2023   ID:  [name], DOB 08/05/1955, MRN [id number]  Patient Location: Home Provider Location: Office/Clinic  PCP:  Grayce Sessions, NP   Chief Complaint:  lower back pain  Discussed the use of AI scribe software for clinical note transcription with the patient, who gave verbal consent to proceed.       History of Present Illness:    [name] is a 68 y.o. female with left lower back pain. Patient states she had some hydrocodone from like 2 years ago, she states she took one of them and it barely helped with the pain. Patient states started Friday. When patient stands she states pain is a 10/10, when patient is sitting her pain is 7/10.  Chief Complaint  Patient presents with   Back Pain  .  The patient does not have symptoms concerning for COVID-19 infection (fever, chills, cough, or new shortness of breath).   [name] is a 68 year old female with hypertension and hyperlipidemia who presents with left lower back pain. She has been experiencing left lower back pain that radiates down to her left leg and foot, accompanied by stiffness and muscle spasms. There is no history of chronic pain prior to this episode, and she has not yet taken any medications for relief. She is currently taking Lipitor 40 mg for hyperlipidemia and hydrochlorothiazide for hypertension. An MRI of her brain was performed in 2014, and imaging of her back has been done previously, though no specific findings were mentioned.    Past Medical History:  Diagnosis Date   Allergy    allergy , sinus issues   Anemia    when younger   Arthritis     HANDS,KNEES,"LITTLE BIT"   Hx of blood clots 04/04/1991   previously on Coumadin x 3 months in the past    Hyperlipidemia    on meds   Hypertension    on meds   Positive colorectal cancer screening using Cologuard test 03/02/2021    Past Surgical History:  Procedure Laterality Date   COLONOSCOPY     POLYPECTOMY     TOOTH EXTRACTION N/A 03/21/2022   Procedure: EXTRACTION ALL REMAINING TEETH NUMBER THREE, FIVE, SIX, SEVEN, EIGHT, NINE, TEN, ELEVEN, TWELVE, TWENTY ONE, TWENTY TWO, TWENTY THREE, TWENTY FOUR, TWENTY FIVE, TWENTY SIX, TWENTY SEVEN, TWENTY EIGHT, TWENTY NINE, THIRTY TWO, ALVEO LOPLASTY;  Surgeon: Ocie Doyne, DMD;  Location: [facility] OR;  Service: Oral Surgery;  Laterality: N/A;   tooth pulled with sedation     many years ago    Family History  Problem Relation Age of Onset   Breast cancer Neg Hx    Colon cancer Neg Hx    Colon polyps Neg Hx    Esophageal cancer Neg Hx    Rectal cancer Neg Hx    Stomach cancer Neg Hx    Crohn's disease Neg Hx     Social History   Socioeconomic History   Marital status: Married    Spouse name: Not on file   Number of children: Not on file   Years of education: Not on file  Highest education level: Not on file  Occupational History   Not on file  Tobacco Use   Smoking status: Never    Passive exposure: Current   Smokeless tobacco: Never  Vaping Use   Vaping status: Never Used  Substance and Sexual Activity   Alcohol use: Yes    Alcohol/week: 2.0 standard drinks of alcohol    Types: 2 Cans of beer per week    Comment: occasionally   Drug use: No   Sexual activity: Not Currently  Other Topics Concern   Not on file  Social History Narrative   Not on file   Social Drivers of Health   Financial Resource Strain: Low Risk  (09/11/2022)   Overall Financial Resource Strain (CARDIA)    Difficulty of Paying Living Expenses: Not hard at all  Food Insecurity: No Food Insecurity (04/30/2023)   Hunger Vital Sign    Worried  About Running Out of Food in the Last Year: Never true    Ran Out of Food in the Last Year: Never true  Transportation Needs: No Transportation Needs (04/30/2023)   PRAPARE - Administrator, Civil Service (Medical): No    Lack of Transportation (Non-Medical): No  Physical Activity: Sufficiently Active (09/11/2022)   Exercise Vital Sign    Days of Exercise per Week: 7 days    Minutes of Exercise per Session: 30 min  Stress: No Stress Concern Present (09/11/2022)   [employer] of Occupational Health - Occupational Stress Questionnaire    Feeling of Stress : Not at all  Social Connections: Socially Integrated (09/11/2022)   Social Connection and Isolation Panel [NHANES]    Frequency of Communication with Friends and Family: More than three times a week    Frequency of Social Gatherings with Friends and Family: More than three times a week    Attends Religious Services: More than 4 times per year    Active Member of [employer] or Organizations: Yes    Attends Engineer, structural: More than 4 times per year    Marital Status: Married  Catering manager Violence: Not At Risk (04/30/2023)   Humiliation, Afraid, Rape, and Kick questionnaire    Fear of Current or Ex-Partner: No    Emotionally Abused: No    Physically Abused: No    Sexually Abused: No    Outpatient Medications Prior to Visit  Medication Sig Dispense Refill   atorvastatin (LIPITOR) 40 MG tablet Take 1 tablet (40 mg total) by mouth daily. 90 tablet 1   hydrochlorothiazide (HYDRODIURIL) 25 MG tablet Take 1 tablet (25 mg total) by mouth daily. 100 tablet 0   No facility-administered medications prior to visit.    Allergies  Allergen Reactions   Lisinopril     Suspect ACE inhibitor allergy as patient creatinine nearly doubled after initiation of lisinopril     Social History   Tobacco Use   Smoking status: Never    Passive exposure: Current   Smokeless tobacco: Never  Vaping Use   Vaping status:  Never Used  Substance Use Topics   Alcohol use: Yes    Alcohol/week: 2.0 standard drinks of alcohol    Types: 2 Cans of beer per week    Comment: occasionally   Drug use: No     Review of Systems  Constitutional:  Negative for chills and fever.  HENT:  Negative for congestion, ear pain and sinus pain.   Respiratory:  Negative for cough.   Cardiovascular:  Negative for chest pain.  Gastrointestinal:  Negative for diarrhea, nausea and vomiting.  Genitourinary:  Negative for urgency.  Musculoskeletal:  Positive for back pain.  Neurological:  Negative for dizziness and headaches.     Labs/Other Tests and Data Reviewed:    Recent Labs: 05/04/2023: ALT 19; BUN 14; Creatinine, Ser 0.84; Hemoglobin 14.1; Platelets 234; Potassium 3.8; Sodium 143   Recent Lipid Panel Lab Results  Component Value Date/Time   CHOL 220 (H) 05/04/2023 10:55 AM   TRIG 176 (H) 05/04/2023 10:55 AM   HDL 54 05/04/2023 10:55 AM   CHOLHDL 4.1 05/04/2023 10:55 AM   CHOLHDL 6.9 06/06/2012 08:05 PM   LDLCALC 135 (H) 05/04/2023 10:55 AM    Wt Readings from Last 3 Encounters:  05/28/23 183 lb (83 kg)  04/30/23 189 lb 6.4 oz (85.9 kg)  01/26/23 188 lb 9.6 oz (85.5 kg)     Objective:    Vital Signs:  BP 112/87   Pulse 79   Ht 5' 6.5" (1.689 m)   Wt 183 lb (83 kg)   BMI 29.09 kg/m    Physical Exam Vitals reviewed.  Constitutional:      Comments: Appears to be in some pain  Neurological:     Mental Status: She is alert.      ASSESSMENT & PLAN:   Acute left-sided low back pain without sciatica Assessment & Plan: Acute Muscle Spasm in Lower Back Alyviah Crandle, a 67 year old female, presents with acute left lower back pain radiating to her left leg, likely due to a muscle spasm.  The pain is associated with stiffness and significant discomfort.   Discussed ibuprofen and methocarbamol for symptom relief, emphasizing the importance of taking medications with food and water to avoid gastrointestinal  issues.   Advised heat application for muscle relaxation and core strengthening exercises to prevent recurrence.   Instructed to seek in-person evaluation if severe pain, significant tingling, numbness, or shooting pain down the leg occurs.   - Prescribe ibuprofen for 5 days, up to three times daily with food and water  - Prescribe methocarbamol, up to three times daily as needed  - Advise heat application to the affected area  - Recommend core strengthening exercises once pain subsides  - Instruct to seek in-person evaluation if severe pain, significant tingling, numbness, or shooting pain down the leg occurs  - Send prescriptions to [facility]   - Provide work excuse letter until May 30, 2023, with potential return on May 31, 2023, if improved   Hypertension Pearlene is currently on hydrochlorothiazide. No changes to her current management were discussed.   Hyperlipidemia Lawrence is currently on Lipitor 40 mg. No changes to her current management were discussed.    Other orders -     Ibuprofen; Take 1 tablet (600 mg total) by mouth every 8 (eight) hours as needed for up to 5 days.  Dispense: 15 tablet; Refill: 0 -     Methocarbamol; Take 1 tablet (750 mg total) by mouth every 8 (eight) hours as needed for up to 5 days for muscle spasms.  Dispense: 15 tablet; Refill: 0     No orders of the defined types were placed in this encounter.    Meds ordered this encounter  Medications   ibuprofen (ADVIL) 600 MG tablet    Sig: Take 1 tablet (600 mg total) by mouth every 8 (eight) hours as needed for up to 5 days.    Dispense:  15 tablet    Refill:  0  methocarbamol (ROBAXIN) 750 MG tablet    Sig: Take 1 tablet (750 mg total) by mouth every 8 (eight) hours as needed for up to 5 days for muscle spasms.    Dispense:  15 tablet    Refill:  0    I spent < 22 > minutes dedicated to the care of this patient on the date of this encounter to include face-to-face  time with the patient.  Follow Up:  In Person prn  Signed, Windell Moment, MD  05/28/2023 11:56 AM    [facility]

## 2023-05-28 NOTE — Telephone Encounter (Signed)
 noted

## 2023-05-28 NOTE — Telephone Encounter (Signed)
 Will forward to provider

## 2023-05-30 ENCOUNTER — Other Ambulatory Visit: Payer: Self-pay

## 2023-05-30 ENCOUNTER — Ambulatory Visit
Admission: RE | Admit: 2023-05-30 | Discharge: 2023-05-30 | Disposition: A | Discharge status: Home / Self Care | Payer: 59 | Source: Ambulatory Visit | Attending: Family Medicine | Admitting: Family Medicine

## 2023-05-30 VITALS — BP 139/89 | HR 76 | Temp 97.5°F | Resp 16

## 2023-05-30 DIAGNOSIS — M5442 Lumbago with sciatica, left side: Secondary | ICD-10-CM | POA: Diagnosis not present

## 2023-05-30 MED ORDER — NAPROXEN 375 MG PO TABS
375.0000 mg | ORAL_TABLET | Freq: Two times a day (BID) | ORAL | 0 refills | Status: DC
  Filled 2023-05-30: qty 14, 7d supply, fill #0

## 2023-05-30 MED ORDER — LIDOCAINE 5 % EX PTCH
1.0000 | MEDICATED_PATCH | CUTANEOUS | 0 refills | Status: DC
  Filled 2023-05-30: qty 14, 14d supply, fill #0

## 2023-05-30 MED ORDER — PREDNISONE 10 MG (21) PO TBPK
ORAL_TABLET | ORAL | 0 refills | Status: AC
  Filled 2023-05-30: qty 21, 6d supply, fill #0

## 2023-05-30 NOTE — ED Triage Notes (Signed)
 Pt presents with c/o rt lower back pain x 6 days. Pt states the pain never went away, c/o constant pain. Pt reports doing a telehealth visit, was prescribed Ibuprofen and Methocarbamol that has not helped.

## 2023-05-30 NOTE — ED Provider Notes (Addendum)
 [facility]    CSN: [id number] Arrival date & time: 05/30/23  7846      History   Chief Complaint Chief Complaint  Patient presents with   Appointment    left  hip/lower back pain - Entered by patient    HPI [name] is a 68 y.o. female presents for back pain.  Patient reports 6 days of intermittent left lower back pain that radiates into her buttock.  States it began after doing some cleaning at home.  Denies any injury such as fall.  States at rest it resolves but is worse with movement, standing up, sitting.  Denies any numbness/tingling/weakness of her lower extremities, no bowel or bladder incontinence, no saddle paresthesia.  Denies history of surgeries or fractures to the back in the past.  Patient did do a telehealth visit on 2/24 and was prescribed 600 mg ibuprofen and methocarbamol.  She states she has been taking this as prescribed without improvement.  No OTC medications have been new since onset.  No other concerns at this time.  HPI  Past Medical History:  Diagnosis Date   Allergy    allergy , sinus issues   Anemia    when younger   Arthritis    HANDS,KNEES,"LITTLE BIT"   Hx of blood clots 04/04/1991   previously on Coumadin x 3 months in the past    Hyperlipidemia    on meds   Hypertension    on meds   Positive colorectal cancer screening using Cologuard test 03/02/2021    Patient Active Problem List   Diagnosis Date Noted   Acute left-sided low back pain without sciatica 05/28/2023   Acute kidney injury (HCC) 07/02/2012   Essential hypertension 07/01/2012   Cocaine abuse (HCC) 06/07/2012   Acute URI 06/07/2012   Hypertensive urgency 06/06/2012    Past Surgical History:  Procedure Laterality Date   COLONOSCOPY     POLYPECTOMY     TOOTH EXTRACTION N/A 03/21/2022   Procedure: EXTRACTION ALL REMAINING TEETH NUMBER THREE, FIVE, SIX, SEVEN, EIGHT, NINE, TEN, ELEVEN, TWELVE, TWENTY ONE, TWENTY TWO, TWENTY THREE, TWENTY FOUR, TWENTY  FIVE, TWENTY SIX, TWENTY SEVEN, TWENTY EIGHT, TWENTY NINE, THIRTY TWO, ALVEO LOPLASTY;  Surgeon: Ocie Doyne, DMD;  Location: [facility] OR;  Service: Oral Surgery;  Laterality: N/A;   tooth pulled with sedation     many years ago    OB History   No obstetric history on file.      Home Medications    Prior to Admission medications   Medication Sig Start Date End Date Taking? Authorizing Provider  lidocaine (LIDODERM) 5 % Place 1 patch onto the skin daily. Apply to your left lower back for 12 hours and remove for 12 hours 05/30/23  Yes Radford Pax, NP  naproxen (NAPROSYN) 375 MG tablet Take 1 tablet (375 mg total) by mouth 2 (two) times daily. 05/30/23  Yes Radford Pax, NP  predniSONE (STERAPRED UNI-PAK 21 TAB) 10 MG (21) TBPK tablet Take by mouth daily. Take 6 tabs by mouth daily  for 1 day, then 5 tabs for 1 day, then 4 tabs for 1 day, then 3 tabs for 1 day, 2 tabs for 1 day, then 1 tab by mouth daily for 1 days 05/30/23  Yes Radford Pax, NP  atorvastatin (LIPITOR) 40 MG tablet Take 1 tablet (40 mg total) by mouth daily. 05/07/23   Grayce Sessions, NP  hydrochlorothiazide (HYDRODIURIL) 25 MG tablet Take 1 tablet (25 mg  total) by mouth daily. 05/25/23   Grayce Sessions, NP  ibuprofen (ADVIL) 600 MG tablet Take 1 tablet (600 mg total) by mouth every 8 (eight) hours as needed for up to 5 days. 05/28/23 06/02/23  Windell Moment, MD  methocarbamol (ROBAXIN) 750 MG tablet Take 1 tablet (750 mg total) by mouth every 8 (eight) hours as needed for up to 5 days for muscle spasms. 05/28/23 06/02/23  Windell Moment, MD  CHLORHEXIDINE GLUCONATE, BULK, SOLN Use a cap full and gargle after brushing teeth twice daily Patient not taking: Reported on 03/20/2022 09/29/21 04/13/22  Grayce Sessions, NP    Family History Family History  Problem Relation Age of Onset   Breast cancer Neg Hx    Colon cancer Neg Hx    Colon polyps Neg Hx    Esophageal cancer Neg Hx    Rectal cancer Neg Hx    Stomach  cancer Neg Hx    Crohn's disease Neg Hx     Social History Social History   Tobacco Use   Smoking status: Never    Passive exposure: Current   Smokeless tobacco: Never  Vaping Use   Vaping status: Never Used  Substance Use Topics   Alcohol use: Yes    Alcohol/week: 2.0 standard drinks of alcohol    Types: 2 Cans of beer per week    Comment: occasionally   Drug use: No     Allergies   Lisinopril   Review of Systems Review of Systems  Musculoskeletal:  Positive for back pain.     Physical Exam Triage Vital Signs ED Triage Vitals  Encounter Vitals Group     BP 05/30/23 0943 139/89     Systolic BP Percentile --      Diastolic BP Percentile --      Pulse Rate 05/30/23 0943 76     Resp 05/30/23 0943 16     Temp 05/30/23 0943 (!) 97.5 F (36.4 C)     Temp Source 05/30/23 0943 Oral     SpO2 05/30/23 0943 98 %     Weight --      Height --      Head Circumference --      Peak Flow --      Pain Score 05/30/23 0942 9     Pain Loc --      Pain Education --      Exclude from Growth Chart --    No data found.  Updated Vital Signs BP 139/89 (BP Location: Right Arm)   Pulse 76   Temp (!) 97.5 F (36.4 C) (Oral)   Resp 16   SpO2 98%   Visual Acuity Right Eye Distance:   Left Eye Distance:   Bilateral Distance:    Right Eye Near:   Left Eye Near:    Bilateral Near:     Physical Exam Vitals and nursing note reviewed.  Constitutional:      General: She is not in acute distress.    Appearance: Normal appearance. She is not ill-appearing.  HENT:     Head: Normocephalic and atraumatic.  Eyes:     Pupils: Pupils are equal, round, and reactive to light.  Cardiovascular:     Rate and Rhythm: Normal rate.  Pulmonary:     Effort: Pulmonary effort is normal.  Musculoskeletal:     Thoracic back: Normal.     Lumbar back: Spasms and tenderness present. No swelling, edema, deformity, signs of trauma, lacerations or bony tenderness. Normal  range of motion.  Positive left straight leg raise test. Negative right straight leg raise test. No scoliosis.       Back:     Comments: Strength 5 out of 5 bilateral lower extremities  Skin:    General: Skin is warm and dry.  Neurological:     General: No focal deficit present.     Mental Status: She is alert and oriented to person, place, and time.  Psychiatric:        Mood and Affect: Mood normal.        Behavior: Behavior normal.      UC Treatments / Results  Labs (all labs ordered are listed, but only abnormal results are displayed) Labs Reviewed - No data to display  CMP14+EGFR Order: [id number]  Status: Final result     Next appt: 09/17/2023 at 09:00 AM in [facility] VISIT)     Dx: Essential hypertension   Test Result Released: Yes (not seen)     Messages: Not Seen   0 Result Notes     1 Patient Communication     1 HM Topic  important suggestion  Newer results are available. Click to view them now.            Component Ref Range & Units (hover) 4 mo ago (01/26/23) 7 mo ago (10/13/22) 10 mo ago (07/05/22) 11 mo ago (07/01/22) 1 yr ago (03/21/22) 1 yr ago (10/28/21) 2 yr ago (10/13/20)  Glucose 92 88 82 CM 98 CM 104 High  CM 102 High  104 High  R  BUN 15 17 13  R 11 R 16 R 13 13  Creatinine, Ser 0.78 0.76 0.91 R 0.92 R 0.77 R 0.78 0.83  eGFR 83 86    84 78  BUN/Creatinine Ratio 19 22    17 16   Sodium 139 143 138 R 142 R 140 R 141 139  Potassium 3.6 3.9 3.4 Low  R 3.2 Low  R 3.3 Low  R 4.4 4.2  Chloride 98 101 101 R 103 R 103 R 101 99  CO2 25 27 25  R 24 R 27 R 24 23  Calcium 9.4 9.7 9.4 R 9.3 R 9.0 R 9.7 9.7  Total Protein 7.7 7.3    7.9 8.2  Albumin 4.5 4.4    4.7 4.7 R  Globulin, Total 3.2 2.9    3.2 3.5  Bilirubin Total 0.6 0.3    0.3 0.4  Alkaline Phosphatase 57 47    60 56  AST 25 18    17 17   ALT 22 16    15 17   Resulting Agency [facility]         Narrative Performed by:  Verdell Carmine Performed at:  [address] [facility] [address], [place], [place]  [id number] Lab Director: Jolene Schimke MD, Phone:  [phone]  Specimen Collected: 01/26/23 10:36 Last Resulted: 01/27/23 09:36    EKG   Radiology No results found.  Procedures Procedures (including critical care time)  Medications Ordered in UC Medications - No data to display  Initial Impression / Assessment and Plan / UC Course  I have reviewed the triage vital signs and the nursing notes.  Pertinent labs & imaging results that were available during my care of the patient were reviewed by me and considered in my medical decision making (see chart for details).     Reviewed exam and symptoms with patient.  No red flags.  Discussed low back strain with sciatica.  She declines Toradol or Depo-Medrol injection in clinic.  Will do Lidoderm patch, prednisone taper, and naproxen.  She may continue methocarbamol as previously prescribed as needed.  Discussed heat and rest.  PCP follow-up if symptoms do not improve.  ER precautions reviewed. Final Clinical Impressions(s) / UC Diagnoses   Final diagnoses:  Acute left-sided low back pain with left-sided sciatica     Discharge Instructions      Start naproxen twice daily for 7 days.  Take this with food.  Start prednisone taper as prescribed.  Lidoderm patch to the left lower back as needed.  Leave on for 12 hours and remove for 12 hours.  Heat and rest.  Please follow-up with your PCP if your symptoms do not improve.  Please go to the ER for any worsening symptoms.  Hope you feel better soon!   ED Prescriptions     Medication Sig Dispense Auth. Provider   naproxen (NAPROSYN) 375 MG tablet Take 1 tablet (375 mg total) by mouth 2 (two) times daily. 14 tablet Radford Pax, NP   predniSONE (STERAPRED UNI-PAK 21 TAB) 10 MG (21) TBPK tablet Take by mouth daily. Take 6 tabs by mouth daily  for 1 day, then 5 tabs for 1 day, then 4 tabs for 1 day, then 3 tabs  for 1 day, 2 tabs for 1 day, then 1 tab by mouth daily for 1 days 21 tablet Radford Pax, NP   lidocaine (LIDODERM) 5 % Place 1 patch onto the skin daily. Apply to your left lower back for 12 hours and remove for 12 hours 14 patch Radford Pax, NP      PDMP not reviewed this encounter.   Radford Pax, NP 05/30/23 1004    Radford Pax, NP 05/30/23 1004

## 2023-05-30 NOTE — Discharge Instructions (Signed)
 Start naproxen twice daily for 7 days.  Take this with food.  Start prednisone taper as prescribed.  Lidoderm patch to the left lower back as needed.  Leave on for 12 hours and remove for 12 hours.  Heat and rest.  Please follow-up with your PCP if your symptoms do not improve.  Please go to the ER for any worsening symptoms.  Hope you feel better soon!

## 2023-08-02 ENCOUNTER — Other Ambulatory Visit (INDEPENDENT_AMBULATORY_CARE_PROVIDER_SITE_OTHER): Payer: Self-pay | Admitting: Primary Care

## 2023-08-02 ENCOUNTER — Other Ambulatory Visit: Payer: Self-pay

## 2023-08-02 ENCOUNTER — Encounter: Payer: Self-pay | Admitting: Pharmacist

## 2023-08-02 DIAGNOSIS — I10 Essential (primary) hypertension: Secondary | ICD-10-CM

## 2023-08-02 DIAGNOSIS — Z760 Encounter for issue of repeat prescription: Secondary | ICD-10-CM

## 2023-08-07 ENCOUNTER — Telehealth (INDEPENDENT_AMBULATORY_CARE_PROVIDER_SITE_OTHER): Payer: Self-pay | Admitting: Primary Care

## 2023-08-07 ENCOUNTER — Other Ambulatory Visit: Payer: Self-pay

## 2023-08-07 NOTE — Telephone Encounter (Signed)
 Copied from CRM [id number]. Topic: Clinical - Prescription Issue >> Aug 07, 2023  1:45 PM Ja-Kwan M wrote: Reason for CRM: Patient stated that one of her Rx refill request was sent in but she was told that the Rx refill request for the blood pressure medication was denied. Patient requests call back to advise why her Rx refill request for her blood pressure medication was denied. Patient last seen 04/30/23.

## 2023-09-04 ENCOUNTER — Other Ambulatory Visit: Payer: Self-pay

## 2023-09-04 ENCOUNTER — Other Ambulatory Visit (INDEPENDENT_AMBULATORY_CARE_PROVIDER_SITE_OTHER): Payer: Self-pay | Admitting: Primary Care

## 2023-09-04 DIAGNOSIS — I10 Essential (primary) hypertension: Secondary | ICD-10-CM

## 2023-09-04 DIAGNOSIS — E782 Mixed hyperlipidemia: Secondary | ICD-10-CM

## 2023-09-04 DIAGNOSIS — Z760 Encounter for issue of repeat prescription: Secondary | ICD-10-CM

## 2023-09-05 ENCOUNTER — Other Ambulatory Visit: Payer: Self-pay

## 2023-09-05 MED ORDER — HYDROCHLOROTHIAZIDE 25 MG PO TABS
25.0000 mg | ORAL_TABLET | Freq: Every day | ORAL | 0 refills | Status: DC
  Filled 2023-09-05 (×2): qty 100, 100d supply, fill #0

## 2023-09-05 MED ORDER — ATORVASTATIN CALCIUM 40 MG PO TABS
40.0000 mg | ORAL_TABLET | Freq: Every day | ORAL | 1 refills | Status: DC
  Filled 2023-09-05 – 2023-11-02 (×2): qty 90, 90d supply, fill #0
  Filled 2024-01-31: qty 90, 90d supply, fill #1

## 2023-09-17 ENCOUNTER — Encounter (INDEPENDENT_AMBULATORY_CARE_PROVIDER_SITE_OTHER): Payer: 59

## 2023-10-30 ENCOUNTER — Encounter: Payer: Self-pay | Admitting: Internal Medicine

## 2023-11-02 ENCOUNTER — Other Ambulatory Visit: Payer: 59

## 2023-11-02 ENCOUNTER — Other Ambulatory Visit (INDEPENDENT_AMBULATORY_CARE_PROVIDER_SITE_OTHER): Payer: Self-pay | Admitting: Primary Care

## 2023-11-02 ENCOUNTER — Other Ambulatory Visit: Payer: Self-pay

## 2023-11-02 DIAGNOSIS — I10 Essential (primary) hypertension: Secondary | ICD-10-CM

## 2023-11-02 DIAGNOSIS — Z760 Encounter for issue of repeat prescription: Secondary | ICD-10-CM

## 2023-11-02 NOTE — Telephone Encounter (Signed)
 Requested Prescriptions  Refused Prescriptions Disp Refills   hydrochlorothiazide  (HYDRODIURIL ) 25 MG tablet 100 tablet 0    Sig: Take 1 tablet (25 mg total) by mouth daily.     Cardiovascular: Diuretics - Thiazide Failed - 11/02/2023  4:12 PM      Failed - Cr in normal range and within 180 days    Creat  Date Value Ref Range Status  07/01/2012 1.15 (H) 0.50 - 1.10 mg/dL Final   Creatinine, Ser  Date Value Ref Range Status  05/04/2023 0.84 0.57 - 1.00 mg/dL Final         Failed - K in normal range and within 180 days    Potassium  Date Value Ref Range Status  05/04/2023 3.8 3.5 - 5.2 mmol/L Final         Failed - Na in normal range and within 180 days    Sodium  Date Value Ref Range Status  05/04/2023 143 134 - 144 mmol/L Final         Failed - Valid encounter within last 6 months    Recent Outpatient Visits           6 months ago Essential hypertension   [place] [facility] Celestia Rosaline SQUIBB, NP   9 months ago Influenza vaccination declined   [place] [facility] Celestia Rosaline SQUIBB, NP   1 year ago Essential hypertension   [place] [facility] Celestia Rosaline SQUIBB, NP   1 year ago Allergy, initial encounter   [place] [facility] Celestia Rosaline SQUIBB, NP   1 year ago Acute URI   [place] [facility] Celestia Rosaline SQUIBB, NP              Passed - Last BP in normal range    BP Readings from Last 1 Encounters:  05/30/23 139/89

## 2023-11-05 ENCOUNTER — Other Ambulatory Visit: Payer: Self-pay

## 2023-11-28 ENCOUNTER — Encounter

## 2023-11-28 ENCOUNTER — Telehealth: Payer: Self-pay

## 2023-11-28 NOTE — Telephone Encounter (Signed)
 Patient has been rescheduled for PV for tomorrow 8/28 at 3:30

## 2023-11-28 NOTE — Telephone Encounter (Signed)
 Multiple telephone calls placed to complete pre-visit appointment, all unsuccessful.  Message left on cell phone to please call back and reschedule pre-visit appointment by 5pm today to avoid cancellation of scheduled colonoscopy on 12/12/23

## 2023-11-29 ENCOUNTER — Other Ambulatory Visit: Payer: Self-pay

## 2023-11-29 ENCOUNTER — Ambulatory Visit (AMBULATORY_SURGERY_CENTER)

## 2023-11-29 ENCOUNTER — Encounter: Payer: Self-pay | Admitting: Internal Medicine

## 2023-11-29 VITALS — Ht 66.5 in | Wt 200.0 lb

## 2023-11-29 DIAGNOSIS — Z860100 Personal history of colon polyps, unspecified: Secondary | ICD-10-CM

## 2023-11-29 MED ORDER — NA SULFATE-K SULFATE-MG SULF 17.5-3.13-1.6 GM/177ML PO SOLN
1.0000 | Freq: Once | ORAL | 0 refills | Status: AC
  Filled 2023-11-29: qty 354, 2d supply, fill #0

## 2023-11-29 NOTE — Progress Notes (Signed)

## 2023-11-30 ENCOUNTER — Other Ambulatory Visit: Payer: Self-pay

## 2023-12-10 ENCOUNTER — Other Ambulatory Visit: Payer: Self-pay

## 2023-12-10 ENCOUNTER — Other Ambulatory Visit (INDEPENDENT_AMBULATORY_CARE_PROVIDER_SITE_OTHER): Payer: Self-pay | Admitting: Primary Care

## 2023-12-10 DIAGNOSIS — Z760 Encounter for issue of repeat prescription: Secondary | ICD-10-CM

## 2023-12-10 DIAGNOSIS — I10 Essential (primary) hypertension: Secondary | ICD-10-CM

## 2023-12-10 MED ORDER — HYDROCHLOROTHIAZIDE 25 MG PO TABS
25.0000 mg | ORAL_TABLET | Freq: Every day | ORAL | 0 refills | Status: DC
  Filled 2023-12-10: qty 100, 100d supply, fill #0

## 2023-12-11 ENCOUNTER — Emergency Department (HOSPITAL_COMMUNITY)

## 2023-12-11 ENCOUNTER — Emergency Department (HOSPITAL_COMMUNITY)
Admission: EM | Admit: 2023-12-11 | Discharge: 2023-12-11 | Disposition: A | Discharge status: Home / Self Care | Attending: Emergency Medicine | Admitting: Emergency Medicine

## 2023-12-11 ENCOUNTER — Encounter (HOSPITAL_COMMUNITY): Payer: Self-pay

## 2023-12-11 ENCOUNTER — Other Ambulatory Visit: Payer: Self-pay

## 2023-12-11 DIAGNOSIS — R109 Unspecified abdominal pain: Secondary | ICD-10-CM | POA: Insufficient documentation

## 2023-12-11 DIAGNOSIS — R110 Nausea: Secondary | ICD-10-CM | POA: Diagnosis not present

## 2023-12-11 DIAGNOSIS — S0990XA Unspecified injury of head, initial encounter: Secondary | ICD-10-CM | POA: Diagnosis not present

## 2023-12-11 DIAGNOSIS — R42 Dizziness and giddiness: Secondary | ICD-10-CM | POA: Diagnosis not present

## 2023-12-11 DIAGNOSIS — I10 Essential (primary) hypertension: Secondary | ICD-10-CM | POA: Diagnosis not present

## 2023-12-11 DIAGNOSIS — R55 Syncope and collapse: Secondary | ICD-10-CM | POA: Insufficient documentation

## 2023-12-11 DIAGNOSIS — R112 Nausea with vomiting, unspecified: Secondary | ICD-10-CM | POA: Diagnosis not present

## 2023-12-11 DIAGNOSIS — Z79899 Other long term (current) drug therapy: Secondary | ICD-10-CM | POA: Diagnosis not present

## 2023-12-11 DIAGNOSIS — R0602 Shortness of breath: Secondary | ICD-10-CM | POA: Diagnosis not present

## 2023-12-11 DIAGNOSIS — K5730 Diverticulosis of large intestine without perforation or abscess without bleeding: Secondary | ICD-10-CM | POA: Diagnosis not present

## 2023-12-11 DIAGNOSIS — M47812 Spondylosis without myelopathy or radiculopathy, cervical region: Secondary | ICD-10-CM | POA: Diagnosis not present

## 2023-12-11 DIAGNOSIS — E860 Dehydration: Secondary | ICD-10-CM | POA: Diagnosis not present

## 2023-12-11 DIAGNOSIS — R404 Transient alteration of awareness: Secondary | ICD-10-CM | POA: Diagnosis not present

## 2023-12-11 DIAGNOSIS — M4802 Spinal stenosis, cervical region: Secondary | ICD-10-CM | POA: Diagnosis not present

## 2023-12-11 DIAGNOSIS — S199XXA Unspecified injury of neck, initial encounter: Secondary | ICD-10-CM | POA: Diagnosis not present

## 2023-12-11 DIAGNOSIS — Z743 Need for continuous supervision: Secondary | ICD-10-CM | POA: Diagnosis not present

## 2023-12-11 LAB — COMPREHENSIVE METABOLIC PANEL WITH GFR
ALT: 23 U/L (ref 0–44)
AST: 29 U/L (ref 15–41)
Albumin: 4.6 g/dL (ref 3.5–5.0)
Alkaline Phosphatase: 46 U/L (ref 38–126)
Anion gap: 19 — ABNORMAL HIGH (ref 5–15)
BUN: 12 mg/dL (ref 8–23)
CO2: 20 mmol/L — ABNORMAL LOW (ref 22–32)
Calcium: 9.7 mg/dL (ref 8.9–10.3)
Chloride: 95 mmol/L — ABNORMAL LOW (ref 98–111)
Creatinine, Ser: 1.05 mg/dL — ABNORMAL HIGH (ref 0.44–1.00)
GFR, Estimated: 58 mL/min — ABNORMAL LOW (ref >60)
Glucose, Bld: 113 mg/dL — ABNORMAL HIGH (ref 70–99)
Potassium: 3.2 mmol/L — ABNORMAL LOW (ref 3.5–5.1)
Sodium: 134 mmol/L — ABNORMAL LOW (ref 135–145)
Total Bilirubin: 0.9 mg/dL (ref 0.0–1.2)
Total Protein: 8.1 g/dL (ref 6.5–8.1)

## 2023-12-11 LAB — CBC
HCT: 40.2 % (ref 36.0–46.0)
Hemoglobin: 13.8 g/dL (ref 12.0–15.0)
MCH: 30.9 pg (ref 26.0–34.0)
MCHC: 34.3 g/dL (ref 30.0–36.0)
MCV: 90.1 fL (ref 80.0–100.0)
Platelets: 231 K/uL (ref 150–400)
RBC: 4.46 MIL/uL (ref 3.87–5.11)
RDW: 12.5 % (ref 11.5–15.5)
WBC: 5.7 K/uL (ref 4.0–10.5)
nRBC: 0 % (ref 0.0–0.2)

## 2023-12-11 MED ORDER — SODIUM CHLORIDE 0.9 % IV BOLUS
1000.0000 mL | Freq: Once | INTRAVENOUS | Status: AC
  Administered 2023-12-11: 1000 mL via INTRAVENOUS

## 2023-12-11 MED ORDER — MORPHINE SULFATE (PF) 4 MG/ML IV SOLN
4.0000 mg | Freq: Once | INTRAVENOUS | Status: AC
  Administered 2023-12-11: 4 mg via INTRAVENOUS
  Filled 2023-12-11: qty 1

## 2023-12-11 MED ORDER — ONDANSETRON HCL 4 MG/2ML IJ SOLN
4.0000 mg | Freq: Once | INTRAMUSCULAR | Status: AC
  Administered 2023-12-11: 4 mg via INTRAVENOUS
  Filled 2023-12-11: qty 2

## 2023-12-11 MED ORDER — IOHEXOL 350 MG/ML SOLN
75.0000 mL | Freq: Once | INTRAVENOUS | Status: AC | PRN
  Administered 2023-12-11: 75 mL via INTRAVENOUS

## 2023-12-11 MED ORDER — POTASSIUM CHLORIDE CRYS ER 20 MEQ PO TBCR
40.0000 meq | EXTENDED_RELEASE_TABLET | Freq: Once | ORAL | Status: AC
  Administered 2023-12-11: 40 meq via ORAL
  Filled 2023-12-11: qty 2

## 2023-12-11 NOTE — ED Provider Notes (Signed)
 [place] EMERGENCY DEPARTMENT AT [facility] Provider Note   CSN: [id number] Arrival date & time: 12/11/23  1956     Patient presents with: No chief complaint on file.   [name] is a 68 y.o. female with PMH significant for hypertensive urgency, cocaine abuse, arthritis, back pain who presents with NVD, syncope. Currently on colonoscopy prep for routine colonoscopy tomorrow.  Reports abdominal cramping.  Denies fever, chills, blood in emesis or stool.  Denies any chest pain prior to syncope.   HPI     Prior to Admission medications   Medication Sig Start Date End Date Taking? Authorizing Provider  atorvastatin  (LIPITOR) 40 MG tablet Take 1 tablet (40 mg total) by mouth daily. 09/05/23   Celestia Rosaline SQUIBB, NP  hydrochlorothiazide  (HYDRODIURIL ) 25 MG tablet Take 1 tablet (25 mg total) by mouth daily. 12/10/23   Celestia Rosaline SQUIBB, NP  lidocaine  (LIDODERM ) 5 % Place 1 patch onto the skin daily. Apply to your left lower back for 12 hours and remove for 12 hours 05/30/23   Mayer, Jodi R, NP  naproxen  (NAPROSYN ) 375 MG tablet Take 1 tablet (375 mg total) by mouth 2 (two) times daily. 05/30/23   Mayer, Jodi R, NP  CHLORHEXIDINE  GLUCONATE, BULK, SOLN Use a cap full and gargle after brushing teeth twice daily Patient not taking: Reported on 03/20/2022 09/29/21 04/13/22  Celestia Rosaline SQUIBB, NP    Allergies: Lisinopril     Review of Systems  All other systems reviewed and are negative.   Updated Vital Signs BP (!) 143/123   Pulse 73   Temp 97.6 F (36.4 C)   Resp 14   Ht 5' 6 (1.676 m)   Wt 86.2 kg   SpO2 100%   BMI 30.67 kg/m   Physical Exam Vitals and nursing note reviewed.  Constitutional:      General: She is not in acute distress.    Appearance: Normal appearance.  HENT:     Head: Normocephalic and atraumatic.  Eyes:     General:        Right eye: No discharge.        Left eye: No discharge.  Cardiovascular:     Rate and Rhythm: Normal rate and  regular rhythm.     Heart sounds: No murmur heard.    No friction rub. No gallop.  Pulmonary:     Effort: Pulmonary effort is normal.     Breath sounds: Normal breath sounds.  Abdominal:     General: Bowel sounds are normal.     Palpations: Abdomen is soft.     Comments: Generalized tenderness to palpation of anterior abdomen, no rebound, rigidity, guarding  Skin:    General: Skin is warm and dry.     Capillary Refill: Capillary refill takes less than 2 seconds.  Neurological:     Mental Status: She is alert and oriented to person, place, and time.  Psychiatric:        Mood and Affect: Mood normal.        Behavior: Behavior normal.     (all labs ordered are listed, but only abnormal results are displayed) Labs Reviewed  COMPREHENSIVE METABOLIC PANEL WITH GFR - Abnormal; Notable for the following components:      Result Value   Sodium 134 (*)    Potassium 3.2 (*)    Chloride 95 (*)    CO2 20 (*)    Glucose, Bld 113 (*)    Creatinine, Ser 1.05 (*)  GFR, Estimated 58 (*)    Anion gap 19 (*)    All other components within normal limits  CBC  LIPASE, BLOOD  URINALYSIS, ROUTINE W REFLEX MICROSCOPIC    EKG: None  Radiology: CT ABDOMEN PELVIS W CONTRAST Result Date: 12/11/2023 CLINICAL DATA:  Abdominal pain and syncopal episode post colonoscopy prep. EXAM: CT ABDOMEN AND PELVIS WITH CONTRAST TECHNIQUE: Multidetector CT imaging of the abdomen and pelvis was performed using the standard protocol following bolus administration of intravenous contrast. RADIATION DOSE REDUCTION: This exam was performed according to the departmental dose-optimization program which includes automated exposure control, adjustment of the mA and/or kV according to patient size and/or use of iterative reconstruction technique. CONTRAST:  75mL OMNIPAQUE  IOHEXOL  350 MG/ML SOLN COMPARISON:  None Available. FINDINGS: Lower chest: Bibasilar scarring type changes but no infiltrates or effusions. The heart is  borderline enlarged. No pericardial effusion. Tortuosity and calcification of the lower thoracic aorta noted. The esophagus is grossly normal. Small hiatal hernia. Hepatobiliary: No hepatic lesions or intrahepatic biliary dilatation. The gallbladder is unremarkable. No common bile duct dilatation. Pancreas: No mass, inflammation or ductal dilatation. Spleen: Normal in size without focal abnormality. Adrenals/Urinary Tract: The adrenal glands are normal. No renal lesions, renal calculi or hydronephrosis. The delayed images do not demonstrate any significant collecting system abnormalities. The bladder is unremarkable. Stomach/Bowel: The stomach, small bowel and entire colon are fluid-filled likely related to the patient's colonoscopy prep. No inflammatory changes or obstructive findings. Moderate scotch that colonic diverticulosis mainly involving the descending and sigmoid colon. The terminal ileum appears normal. The appendix is normal. Vascular/Lymphatic: Tortuosity, ectasia and calcification of the abdominal aorta without discrete aneurysm. Branch vessel calcifications but no occlusion or aneurysm. No abdominal or pelvic lymphadenopathy. The major venous structures are patent. Retroaortic left renal vein. Reproductive: Enlarged fibroid uterus with calcified and noncalcified uterine fibroids. No adnexal mass. Scattered ovarian calcifications bilaterally. Other: No ascites or free air. Musculoskeletal: No significant bony findings. IMPRESSION: 1. No acute abdominal/pelvic findings, mass lesions or adenopathy. 2. Fluid-filled stomach, small bowel and entire colon likely related to the patient's colonoscopy PREP. No inflammatory changes or obstructive findings. 3. Enlarged fibroid uterus. 4. Aortic atherosclerosis. Aortic Atherosclerosis (ICD10-I70.0). Electronically Signed   By: MYRTIS Stammer M.D.   On: 12/11/2023 22:13   CT Cervical Spine Wo Contrast Result Date: 12/11/2023 CLINICAL DATA:  Neck trauma (Age >=  65y) EXAM: CT CERVICAL SPINE WITHOUT CONTRAST TECHNIQUE: Multidetector CT imaging of the cervical spine was performed without intravenous contrast. Multiplanar CT image reconstructions were also generated. RADIATION DOSE REDUCTION: This exam was performed according to the departmental dose-optimization program which includes automated exposure control, adjustment of the mA and/or kV according to patient size and/or use of iterative reconstruction technique. COMPARISON:  07/05/2022 FINDINGS: Alignment: No subluxation. Skull base and vertebrae: No acute fracture. No primary bone lesion or focal pathologic process. Soft tissues and spinal canal: No prevertebral fluid or swelling. No visible canal hematoma. Disc levels: Multilevel degenerative changes. Multilevel bilateral neural foraminal narrowing. Upper chest: No acute findings Other: None IMPRESSION: Multilevel degenerative changes. No acute bony abnormality. Electronically Signed   By: Franky Crease M.D.   On: 12/11/2023 21:08   CT Head Wo Contrast Result Date: 12/11/2023 CLINICAL DATA:  Hypotension, head trauma, syncope EXAM: CT HEAD WITHOUT CONTRAST TECHNIQUE: Contiguous axial images were obtained from the base of the skull through the vertex without intravenous contrast. RADIATION DOSE REDUCTION: This exam was performed according to the departmental dose-optimization program which includes automated  exposure control, adjustment of the mA and/or kV according to patient size and/or use of iterative reconstruction technique. COMPARISON:  07/05/2022 FINDINGS: Brain: No acute infarct or hemorrhage. Lateral ventricles and midline structures are stable. No acute extra-axial fluid collections. No mass effect. Vascular: No hyperdense vessel or unexpected calcification. Skull: Normal. Negative for fracture or focal lesion. Sinuses/Orbits: No acute finding. Other: None. IMPRESSION: 1. No acute intracranial process. Electronically Signed   By: Ozell Daring M.D.   On:  12/11/2023 21:07   DG Chest Portable 1 View Result Date: 12/11/2023 CLINICAL DATA:  Shortness of breath EXAM: PORTABLE CHEST 1 VIEW COMPARISON:  03/06/2018 FINDINGS: Stable cardiomediastinal silhouette. No focal opacity, pleural effusion, or pneumothorax. IMPRESSION: No active disease. Electronically Signed   By: Luke Bun M.D.   On: 12/11/2023 20:39     Procedures   Medications Ordered in the ED  ondansetron  (ZOFRAN ) injection 4 mg (4 mg Intravenous Given 12/11/23 2023)  sodium chloride  0.9 % bolus 1,000 mL (1,000 mLs Intravenous New Bag/Given 12/11/23 2022)  morphine  (PF) 4 MG/ML injection 4 mg (4 mg Intravenous Given 12/11/23 2023)  potassium chloride  SA (KLOR-CON  M) CR tablet 40 mEq (40 mEq Oral Given 12/11/23 2129)  iohexol  (OMNIPAQUE ) 350 MG/ML injection 75 mL (75 mLs Intravenous Contrast Given 12/11/23 2150)                                    Medical Decision Making  This patient is a 68 y.o. female  who presents to the ED for concern of abdominal pain, syncope.   Differential diagnoses prior to evaluation: The emergent differential diagnosis includes, but is not limited to,  The causes of generalized abdominal pain include but are not limited to AAA, mesenteric ischemia, appendicitis, diverticulitis, DKA, gastritis, gastroenteritis, AMI, nephrolithiasis, pancreatitis, peritonitis, adrenal insufficiency,lead poisoning, iron toxicity, intestinal ischemia, constipation, UTI,SBO/LBO, splenic rupture, biliary disease, IBD, IBS, PUD, or hepatitis -- CVA, ACS, arrhythmia, vasovagal / orthostatic hypotension, sepsis, hypoglycemia, electrolyte disturbance, respiratory failure, anemia, dehydration, heat injury, polypharmacy, malignancy, anxiety/panic attack. This is not an exhaustive differential.   Past Medical History / Co-morbidities / Social History: hypertensive urgency, cocaine abuse, arthritis, back pain  Physical Exam: Physical exam performed. The pertinent findings include: Generalized  tenderness to palpation of anterior abdomen, no rebound, rigidity, guarding   Soft blood pressure on arrival, 102/68, improved to 139/95 on reevaluation.  Vital signs otherwise stable.  Lab Tests/Imaging studies: I personally interpreted labs/imaging and the pertinent results include: CBC unremarkable, CMP notable for mild hypokalemia, potassium 3.2, mildly elevated creatinine 1.05, anion gap, 19.  Lipase labs are being sent out, still in process and is not having been sent to [facility] unremarkable.  CT abdomen pelvis, CT C-spine, CT head, plain, chest x-ray all overall unremarkable, some fluid in the stomach likely related to her colonoscopy prep.  I agree with the radiologist interpretation.  Cardiac monitoring: EKG obtained and interpreted by myself and attending physician which shows: Normal sinus rhythm, nonspecific T wave abnormality   Medications: I ordered medication including morphine , potassium, Zofran  for pain, hypokalemia, nausea, fluid bolus for suspected dehydration.  I have reviewed the patients home medicines and have made adjustments as needed.   Disposition: After consideration of the diagnostic results and the patients response to treatment, I feel that patient symptoms likely secondary to dehydration from colonoscopy prep, she is feeling improved on reevaluation, I think that she is stable for discharge,  tolerating p.o. at time of discharge.   emergency department workup does not suggest an emergent condition requiring admission or immediate intervention beyond what has been performed at this time. The plan is: as above. The patient is safe for discharge and has been instructed to return immediately for worsening symptoms, change in symptoms or any other concerns.  Patient feeling improved after rehydration, tolerating p.o.  Stable for discharge.  Suspect vasovagal syncope secondary to colonoscopy prep.  Final diagnoses:  Dehydration  Vasovagal syncope  Nausea and vomiting,  unspecified vomiting type    ED Discharge Orders     None          Rosan Sherlean VEAR DEVONNA 12/11/23 2313    Francesca Elsie CROME, MD 12/11/23 2351

## 2023-12-11 NOTE — Discharge Instructions (Signed)
 Please drink plenty of fluids including electrolyte containing fluids, such as pedialyte, gatorade to help with dehydration.  Follow-up closely with your primary care doctor, and follow-up with your GI doctor in regards to colonoscopy in the future.

## 2023-12-11 NOTE — ED Triage Notes (Signed)
 Pt bib [facility] from home where she lives with her husband, with complaints of n/v/d that started today after starting colonoscopy prep. Pt did have a syncopal episode this evening and when EMS got to the pt her blood pressure was 70palp. Pt arrives AOx4 with abdominal pain and cramping.

## 2023-12-12 ENCOUNTER — Telehealth: Payer: Self-pay | Admitting: Internal Medicine

## 2023-12-12 ENCOUNTER — Encounter: Admitting: Internal Medicine

## 2023-12-12 ENCOUNTER — Other Ambulatory Visit: Payer: Self-pay

## 2023-12-12 LAB — LIPASE, BLOOD: Lipase: 52 U/L — ABNORMAL HIGH (ref 11–51)

## 2023-12-12 NOTE — Telephone Encounter (Signed)
 Cancel for today - needs a call back  Went to ED w/ syncope w/ prep  I have not spoken to her

## 2023-12-12 NOTE — Telephone Encounter (Signed)
 Called patient back and she would like for a recall to be placed because she does not know exactly when she would like to come back. Recall placed for 3 months.

## 2024-02-04 ENCOUNTER — Other Ambulatory Visit: Payer: Self-pay

## 2024-02-05 ENCOUNTER — Ambulatory Visit (INDEPENDENT_AMBULATORY_CARE_PROVIDER_SITE_OTHER)

## 2024-02-05 ENCOUNTER — Encounter (INDEPENDENT_AMBULATORY_CARE_PROVIDER_SITE_OTHER): Payer: Self-pay

## 2024-03-18 ENCOUNTER — Other Ambulatory Visit (INDEPENDENT_AMBULATORY_CARE_PROVIDER_SITE_OTHER): Payer: Self-pay | Admitting: Primary Care

## 2024-03-18 DIAGNOSIS — Z760 Encounter for issue of repeat prescription: Secondary | ICD-10-CM

## 2024-03-18 DIAGNOSIS — I10 Essential (primary) hypertension: Secondary | ICD-10-CM

## 2024-03-21 ENCOUNTER — Other Ambulatory Visit (INDEPENDENT_AMBULATORY_CARE_PROVIDER_SITE_OTHER): Payer: Self-pay | Admitting: Primary Care

## 2024-03-21 ENCOUNTER — Other Ambulatory Visit (INDEPENDENT_AMBULATORY_CARE_PROVIDER_SITE_OTHER): Payer: Self-pay

## 2024-03-21 DIAGNOSIS — Z760 Encounter for issue of repeat prescription: Secondary | ICD-10-CM

## 2024-03-21 DIAGNOSIS — I10 Essential (primary) hypertension: Secondary | ICD-10-CM

## 2024-03-21 NOTE — Telephone Encounter (Unsigned)
 Copied from CRM #[id number]. Topic: Clinical - Medication Refill >> Mar 21, 2024 12:43 PM Zebedee SAUNDERS wrote: Medication: hydrochlorothiazide  (HYDRODIURIL ) 25 MG tablet  Has the patient contacted their pharmacy? Yes (Agent: If no, request that the patient contact the pharmacy for the refill. If patient does not wish to contact the pharmacy document the reason why and proceed with request.) (Agent: If yes, when and what did the pharmacy advise?)  This is the patient's preferred pharmacy:  [facility] [address] [place] [address] Phone: [phone] Fax: [phone]  Is this the correct pharmacy for this prescription? Yes If no, delete pharmacy and type the correct one.   Has the prescription been filled recently? Yes  Is the patient out of the medication? Yes  Has the patient been seen for an appointment in the last year OR does the patient have an upcoming appointment? Yes  Can we respond through MyChart? Yes  Agent: Please be advised that Rx refills may take up to 3 business days. We ask that you follow-up with your pharmacy.

## 2024-03-24 ENCOUNTER — Other Ambulatory Visit: Payer: Self-pay

## 2024-03-24 NOTE — Telephone Encounter (Signed)
 Patient is calling to report that CPE is 04/14/24 medication was denied due to needing any appt.

## 2024-03-25 ENCOUNTER — Other Ambulatory Visit (HOSPITAL_COMMUNITY): Payer: Self-pay

## 2024-03-25 ENCOUNTER — Other Ambulatory Visit: Payer: Self-pay

## 2024-03-25 MED ORDER — HYDROCHLOROTHIAZIDE 25 MG PO TABS
25.0000 mg | ORAL_TABLET | Freq: Every day | ORAL | 0 refills | Status: DC
  Filled 2024-03-25: qty 30, 30d supply, fill #0

## 2024-03-25 NOTE — Telephone Encounter (Signed)
 Patient checking on request for refill. Patient requesting courtesy refill until able to be seen for physical on 04/14/2024. Patient is now completely out and has been out since Sunday.   Requesting callback: [phone]

## 2024-03-25 NOTE — Telephone Encounter (Signed)
 Will forward to provider

## 2024-03-25 NOTE — Telephone Encounter (Signed)
 Requested Prescriptions  Pending Prescriptions Disp Refills   hydrochlorothiazide  (HYDRODIURIL ) 25 MG tablet 30 tablet 0    Sig: Take 1 tablet (25 mg total) by mouth daily.     Cardiovascular: Diuretics - Thiazide Failed - 03/25/2024  2:35 PM      Failed - Cr in normal range and within 180 days    Creat  Date Value Ref Range Status  07/01/2012 1.15 (H) 0.50 - 1.10 mg/dL Final   Creatinine, Ser  Date Value Ref Range Status  12/11/2023 1.05 (H) 0.44 - 1.00 mg/dL Final         Failed - K in normal range and within 180 days    Potassium  Date Value Ref Range Status  12/11/2023 3.2 (L) 3.5 - 5.1 mmol/L Final         Failed - Na in normal range and within 180 days    Sodium  Date Value Ref Range Status  12/11/2023 134 (L) 135 - 145 mmol/L Final  05/04/2023 143 134 - 144 mmol/L Final         Failed - Last BP in normal range    BP Readings from Last 1 Encounters:  12/11/23 (!) 137/106         Failed - Valid encounter within last 6 months    Recent Outpatient Visits           11 months ago Essential hypertension   [place] [facility] Celestia Rosaline SQUIBB, NP   1 year ago Influenza vaccination declined   [place] [facility] Celestia Rosaline SQUIBB, NP   1 year ago Essential hypertension   [place] [facility] Celestia Rosaline SQUIBB, NP   1 year ago Allergy, initial encounter   [place] [facility] Celestia Rosaline SQUIBB, NP   1 year ago Acute URI   [place] [facility] Celestia Rosaline SQUIBB, NP

## 2024-03-26 ENCOUNTER — Telehealth (INDEPENDENT_AMBULATORY_CARE_PROVIDER_SITE_OTHER): Payer: Self-pay | Admitting: Primary Care

## 2024-03-26 NOTE — Telephone Encounter (Signed)
 Copied from CRM #[id number]. Topic: Clinical - Medication Refill >> Mar 21, 2024 12:43 PM Zebedee SAUNDERS wrote: Medication: hydrochlorothiazide  (HYDRODIURIL ) 25 MG tablet  Has the patient contacted their pharmacy? Yes (Agent: If no, request that the patient contact the pharmacy for the refill. If patient does not wish to contact the pharmacy document the reason why and proceed with request.) (Agent: If yes, when and what did the pharmacy advise?)  This is the patient's preferred pharmacy:  [facility] [address], [address] [place] [address] Phone: [phone] Fax: [phone]  Is this the correct pharmacy for this prescription? Yes If no, delete pharmacy and type the correct one.   Has the prescription been filled recently? Yes  Is the patient out of the medication? Yes  Has the patient been seen for an appointment in the last year OR does the patient have an upcoming appointment? Yes  Can we respond through MyChart? Yes  Agent: Please be advised that Rx refills may take up to 3 business days. We ask that you follow-up with your pharmacy. >> Mar 25, 2024  2:09 PM Mia F wrote: Pt called to check the status of the medication she has requested. She says she is out of the medicine. Tried reaching out to [facility] because the notes says pt is not a pt of provider then says forwarded to provider. No answer from [facility]. Please contact pt with an update

## 2024-03-28 NOTE — Telephone Encounter (Signed)
 Last OV was January 2025.  Needs an OV for additional refills.

## 2024-03-31 NOTE — Telephone Encounter (Signed)
 Noted

## 2024-03-31 NOTE — Telephone Encounter (Signed)
"  Pt scheduled for 04/07/24  "

## 2024-04-07 ENCOUNTER — Encounter (INDEPENDENT_AMBULATORY_CARE_PROVIDER_SITE_OTHER): Payer: Self-pay | Admitting: Primary Care

## 2024-04-07 ENCOUNTER — Ambulatory Visit (INDEPENDENT_AMBULATORY_CARE_PROVIDER_SITE_OTHER): Payer: Self-pay | Admitting: Primary Care

## 2024-04-07 ENCOUNTER — Other Ambulatory Visit: Payer: Self-pay

## 2024-04-07 VITALS — BP 111/78 | HR 75 | Resp 16 | Ht 66.5 in | Wt 182.4 lb

## 2024-04-07 DIAGNOSIS — I10 Essential (primary) hypertension: Secondary | ICD-10-CM

## 2024-04-07 DIAGNOSIS — E2839 Other primary ovarian failure: Secondary | ICD-10-CM | POA: Diagnosis not present

## 2024-04-07 DIAGNOSIS — Z760 Encounter for issue of repeat prescription: Secondary | ICD-10-CM

## 2024-04-07 DIAGNOSIS — R7303 Prediabetes: Secondary | ICD-10-CM

## 2024-04-07 DIAGNOSIS — Z1211 Encounter for screening for malignant neoplasm of colon: Secondary | ICD-10-CM

## 2024-04-07 DIAGNOSIS — E782 Mixed hyperlipidemia: Secondary | ICD-10-CM

## 2024-04-07 DIAGNOSIS — E559 Vitamin D deficiency, unspecified: Secondary | ICD-10-CM | POA: Diagnosis not present

## 2024-04-07 MED ORDER — HYDROCHLOROTHIAZIDE 25 MG PO TABS
25.0000 mg | ORAL_TABLET | Freq: Every day | ORAL | 1 refills | Status: AC
  Filled 2024-04-07 – 2024-04-23 (×2): qty 90, 90d supply, fill #0

## 2024-04-07 NOTE — Progress Notes (Signed)
 The [facility] form was  [name] is a 69 y.o. female presents for hypertension evaluation, Denies shortness of breath, headaches, chest pain or lower extremity edema, sudden onset, vision changes, unilateral weakness, dizziness, paresthesias .  Requesting refill.  Patient also shared her experience with trying to have a colonoscopy.  The liquid that she was supposed to take prior to procedure placed her in the hospital for abdominal cramping and weakness.  [name] asked today can we do the Cologuard absolutely. Patient reports adherence with medications.  Dietary habits include:  Monitor sodium intake Exercise habits include: Walks Family / Social history: No family history   Past Medical History:  Diagnosis Date   Allergy    allergy , sinus issues   Anemia    when younger   Arthritis    HANDS,KNEES,LITTLE BIT   Hx of blood clots 04/04/1991   previously on Coumadin x 3 months in the past    Hyperlipidemia    on meds   Hypertension    on meds   Positive colorectal cancer screening using Cologuard test 03/02/2021   Past Surgical History:  Procedure Laterality Date   COLONOSCOPY     POLYPECTOMY     TOOTH EXTRACTION N/A 03/21/2022   Procedure: EXTRACTION ALL REMAINING TEETH NUMBER THREE, FIVE, SIX, SEVEN, EIGHT, NINE, TEN, ELEVEN, TWELVE, TWENTY ONE, TWENTY TWO, TWENTY THREE, TWENTY FOUR, TWENTY FIVE, TWENTY SIX, TWENTY SEVEN, TWENTY EIGHT, TWENTY NINE, THIRTY TWO, ALVEO LOPLASTY;  Surgeon: Sheryle Hamilton, DMD;  Location: [facility] OR;  Service: Oral Surgery;  Laterality: N/A;   tooth pulled with sedation     many years ago   Allergies[1] Medications Ordered Prior to Encounter[2] Social History   Socioeconomic History   Marital status: Married    Spouse name: Not on file   Number of children: Not on file   Years of education: Not on file   Highest education level: Not on file  Occupational History   Not on file  Tobacco Use   Smoking status: Never    Passive  exposure: Current   Smokeless tobacco: Never  Vaping Use   Vaping status: Never Used  Substance and Sexual Activity   Alcohol use: Not Currently    Alcohol/week: 2.0 standard drinks of alcohol    Types: 2 Cans of beer per week   Drug use: No   Sexual activity: Not Currently  Other Topics Concern   Not on file  Social History Narrative   Not on file   Social Drivers of Health   Tobacco Use: Medium Risk (04/07/2024)   Patient History    Smoking Tobacco Use: Never    Smokeless Tobacco Use: Never    Passive Exposure: Current  Financial Resource Strain: Low Risk (09/11/2022)   Overall Financial Resource Strain (CARDIA)    Difficulty of Paying Living Expenses: Not hard at all  Food Insecurity: No Food Insecurity (04/07/2024)   Epic    Worried About Programme Researcher, Broadcasting/film/video in the Last Year: Never true    Ran Out of Food in the Last Year: Never true  Transportation Needs: No Transportation Needs (04/07/2024)   Epic    Lack of Transportation (Medical): No    Lack of Transportation (Non-Medical): No  Physical Activity: Sufficiently Active (09/11/2022)   Exercise Vital Sign    Days of Exercise per Week: 7 days    Minutes of Exercise per Session: 30 min  Stress: No Stress Concern Present (09/11/2022)   [employer] of Occupational  Health - Occupational Stress Questionnaire    Feeling of Stress : Not at all  Social Connections: Socially Integrated (09/11/2022)   Social Connection and Isolation Panel    Frequency of Communication with Friends and Family: More than three times a week    Frequency of Social Gatherings with Friends and Family: More than three times a week    Attends Religious Services: More than 4 times per year    Active Member of Clubs or Organizations: Yes    Attends Banker Meetings: More than 4 times per year    Marital Status: Married  Catering Manager Violence: Not At Risk (04/07/2024)   Epic    Fear of Current or Ex-Partner: No    Emotionally Abused: No     Physically Abused: No    Sexually Abused: No  Depression (PHQ2-9): Low Risk (04/07/2024)   Depression (PHQ2-9)    PHQ-2 Score: 0  Alcohol Screen: Low Risk (09/11/2022)   Alcohol Screen    Last Alcohol Screening Score (AUDIT): 2  Housing: Low Risk (04/07/2024)   Epic    Unable to Pay for Housing in the Last Year: No    Number of Times Moved in the Last Year: 0    Homeless in the Last Year: No  Utilities: Not At Risk (04/07/2024)   Epic    Threatened with loss of utilities: No  Health Literacy: Not on file   Family History  Problem Relation Age of Onset   Breast cancer Neg Hx    Colon cancer Neg Hx    Colon polyps Neg Hx    Esophageal cancer Neg Hx    Rectal cancer Neg Hx    Stomach cancer Neg Hx    Crohn's disease Neg Hx    Health Maintenance  Topic Date Due   Zoster Vaccines- Shingrix (1 of 2) Never done   Pneumococcal Vaccine: 50+ Years (2 of 2 - PPSV23, PCV20, or PCV21) 12/05/2018   Bone Density Scan  Never done   COLON CANCER SCREENING ANNUAL FOBT  11/26/2022   Medicare Annual Wellness (AWV)  09/11/2023   Diabetic kidney evaluation - Urine ACR  10/13/2023   HEMOGLOBIN A1C  11/01/2023   COVID-19 Vaccine (3 - 2025-26 season) 12/03/2023   Influenza Vaccine  07/01/2024 (Originally 11/02/2023)   OPHTHALMOLOGY EXAM  04/19/2024   FOOT EXAM  04/29/2024   Diabetic kidney evaluation - eGFR measurement  12/10/2024   Mammogram  05/17/2025   DTaP/Tdap/Td (2 - Td or Tdap) 10/09/2028   Hepatitis C Screening  Completed   Meningococcal B Vaccine  Aged Out   Colonoscopy  Discontinued     OBJECTIVE:  Vitals:   04/07/24 0926  BP: 111/78  Pulse: 75  Resp: 16  SpO2: 97%  Weight: 182 lb 6.4 oz (82.7 kg)  Height: 5' 6.5 (1.689 m)    Physical Exam Vitals reviewed.  Constitutional:      Appearance: Normal appearance.  HENT:     Head: Normocephalic.     Right Ear: Tympanic membrane, ear canal and external ear normal.     Left Ear: Tympanic membrane, ear canal and external ear  normal.     Nose: Nose normal.     Mouth/Throat:     Mouth: Mucous membranes are moist.  Eyes:     Extraocular Movements: Extraocular movements intact.     Pupils: Pupils are equal, round, and reactive to light.  Cardiovascular:     Rate and Rhythm: Normal rate.  Pulmonary:  Effort: Pulmonary effort is normal.     Breath sounds: Normal breath sounds.  Abdominal:     General: Bowel sounds are normal.     Palpations: Abdomen is soft.  Musculoskeletal:        General: Normal range of motion.     Cervical back: Normal range of motion.  Skin:    General: Skin is warm and dry.  Neurological:     Mental Status: She is alert and oriented to person, place, and time.  Psychiatric:        Mood and Affect: Mood normal.        Behavior: Behavior normal.        Thought Content: Thought content normal.      ROS  Last 3 Office BP readings: BP Readings from Last 3 Encounters:  04/07/24 111/78  12/11/23 (!) 137/106  05/30/23 139/89    BMET    Component Value Date/Time   NA 134 (L) 12/11/2023 2018   NA 143 05/04/2023 1055   K 3.2 (L) 12/11/2023 2018   CL 95 (L) 12/11/2023 2018   CO2 20 (L) 12/11/2023 2018   GLUCOSE 113 (H) 12/11/2023 2018   BUN 12 12/11/2023 2018   BUN 14 05/04/2023 1055   CREATININE 1.05 (H) 12/11/2023 2018   CREATININE 1.15 (H) 07/01/2012 1558   CALCIUM  9.7 12/11/2023 2018   GFRNONAA 58 (L) 12/11/2023 2018   GFRAA 102 04/30/2020 0859    Renal function: CrCl cannot be calculated (Patient's most recent lab result is older than the maximum 21 days allowed.).  Clinical ASCVD: Yes  The 10-year ASCVD risk score (Arnett DK, et al., 2019) is: 19.8%   Values used to calculate the score:     Age: 35 years     Clinically relevant sex: Female     Is Non-Hispanic African American: Yes     Diabetic: Yes     Tobacco smoker: No     Systolic Blood Pressure: 111 mmHg     Is BP treated: Yes     HDL Cholesterol: 54 mg/dL     Total Cholesterol: 220 mg/dL  ASCVD  risk factors include- CHAD [name] was seen today for hypertension and hyperlipidemia.  Diagnoses and all orders for this visit:  Colon cancer screening Cologuard   Medication refill -     hydrochlorothiazide  (HYDRODIURIL ) 25 MG tablet; Take 1 tablet (25 mg total) by mouth daily.  Essential hypertension Well controlled  -     CMP14+EGFR -     hydrochlorothiazide  (HYDRODIURIL ) 25 MG tablet; Take 1 tablet (25 mg total) by mouth daily.  Vitamin D  deficiency Vitamin D  is needed to make and keep bones strong.  She takes  vitamin D  2000 iu over daily  -     VITAMIN D  25 Hydroxy (Vit-D Deficiency, Fractures)  Prediabetes -     Microalbumin / creatinine urine ratio -     CBC with Differential/Platelet -     Hemoglobin A1c  Mixed hyperlipidemia -     Lipid panel  Decreased estrogen level -     DG Bone Density; Future     This note has been created with Education officer, environmental. Any transcriptional errors are unintentional.   Mary SHAUNNA Bohr, [name] 04/07/2024, 9:38 AM      [1]  Allergies Allergen Reactions   Lisinopril  Other (See Comments)    Suspect ACE inhibitor allergy as patient creatinine nearly doubled after initiation of lisinopril   [2]  Current  Outpatient Medications on File Prior to Visit  Medication Sig Dispense Refill   atorvastatin  (LIPITOR) 40 MG tablet Take 1 tablet (40 mg total) by mouth daily. 90 tablet 1   hydrochlorothiazide  (HYDRODIURIL ) 25 MG tablet Take 1 tablet (25 mg total) by mouth daily. 30 tablet 0   lidocaine  (LIDODERM ) 5 % Place 1 patch onto the skin daily. Apply to your left lower back for 12 hours and remove for 12 hours 14 patch 0   naproxen  (NAPROSYN ) 375 MG tablet Take 1 tablet (375 mg total) by mouth 2 (two) times daily. 14 tablet 0   [DISCONTINUED] CHLORHEXIDINE  GLUCONATE, BULK, SOLN Use a cap full and gargle after brushing teeth twice daily (Patient not taking: Reported on 03/20/2022) 4000 mL 1   No  current facility-administered medications on file prior to visit.

## 2024-04-07 NOTE — Patient Instructions (Signed)
 Fall Prevention in the Home, Adult Falls can cause injuries and can happen to people of all ages. There are many things you can do to make your home safer and to help prevent falls. What actions can I take to prevent falls? General information Use good lighting in all rooms. Make sure to: Replace any light bulbs that burn out. Turn on the lights in dark areas and use night-lights. Keep items that you use often in easy-to-reach places. Lower the shelves around your home if needed. Move furniture so that there are clear paths around it. Do not use throw rugs or other things on the floor that can make you trip. If any of your floors are uneven, fix them. Add color or contrast paint or tape to clearly mark and help you see: Grab bars or handrails. First and last steps of staircases. Where the edge of each step is. If you use a ladder or stepladder: Make sure that it is fully opened. Do not climb a closed ladder. Make sure the sides of the ladder are locked in place. Have someone hold the ladder while you use it. Know where your pets are as you move through your home. What can I do in the bathroom?     Keep the floor dry. Clean up any water on the floor right away. Remove soap buildup in the bathtub or shower. Buildup makes bathtubs and showers slippery. Use non-skid mats or decals on the floor of the bathtub or shower. Attach bath mats securely with double-sided, non-slip rug tape. If you need to sit down in the shower, use a non-slip stool. Install grab bars by the toilet and in the bathtub and shower. Do not use towel bars as grab bars. What can I do in the bedroom? Make sure that you have a light by your bed that is easy to reach. Do not use any sheets or blankets on your bed that hang to the floor. Have a firm chair or bench with side arms that you can use for support when you get dressed. What can I do in the kitchen? Clean up any spills right away. If you need to reach something  above you, use a step stool with a grab bar. Keep electrical cords out of the way. Do not use floor polish or wax that makes floors slippery. What can I do with my stairs? Do not leave anything on the stairs. Make sure that you have a light switch at the top and the bottom of the stairs. Make sure that there are handrails on both sides of the stairs. Fix handrails that are broken or loose. Install non-slip stair treads on all your stairs if they do not have carpet. Avoid having throw rugs at the top or bottom of the stairs. Choose a carpet that does not hide the edge of the steps on the stairs. Make sure that the carpet is firmly attached to the stairs. Fix carpet that is loose or worn. What can I do on the outside of my home? Use bright outdoor lighting. Fix the edges of walkways and driveways and fix any cracks. Clear paths of anything that can make you trip, such as tools or rocks. Add color or contrast paint or tape to clearly mark and help you see anything that might make you trip as you walk through a door, such as a raised step or threshold. Trim any bushes or trees on paths to your home. Check to see if handrails are loose  or broken and that both sides of all steps have handrails. Install guardrails along the edges of any raised decks and porches. Have leaves, snow, or ice cleared regularly. Use sand, salt, or ice melter on paths if you live where there is ice and snow during the winter. Clean up any spills in your garage right away. This includes grease or oil spills. What other actions can I take? Review your medicines with your doctor. Some medicines can cause dizziness or changes in blood pressure, which increase your risk of falling. Wear shoes that: Have a low heel. Do not wear high heels. Have rubber bottoms and are closed at the toe. Feel good on your feet and fit well. Use tools that help you move around if needed. These include: Canes. Walkers. Scooters. Crutches. Ask  your doctor what else you can do to help prevent falls. This may include seeing a physical therapist to learn to do exercises to move better and get stronger. Where to find more information Centers for Disease Control and Prevention, [employer]: TonerPromos.no [employer] on Aging: BaseRingTones.pl National Institute on Aging: BaseRingTones.pl Contact a doctor if: You are afraid of falling at home. You feel weak, drowsy, or dizzy at home. You fall at home. Get help right away if you: Lose consciousness or have trouble moving after a fall. Have a fall that causes a head injury. These symptoms may be an emergency. Get help right away. Call 911. Do not wait to see if the symptoms will go away. Do not drive yourself to the hospital. This information is not intended to replace advice given to you by your health care provider. Make sure you discuss any questions you have with your health care provider. Document Revised: 11/21/2021 Document Reviewed: 11/21/2021 Elsevier Patient Education  2024 [employer].

## 2024-04-11 ENCOUNTER — Other Ambulatory Visit (INDEPENDENT_AMBULATORY_CARE_PROVIDER_SITE_OTHER): Payer: Self-pay

## 2024-04-11 ENCOUNTER — Other Ambulatory Visit (INDEPENDENT_AMBULATORY_CARE_PROVIDER_SITE_OTHER)

## 2024-04-11 DIAGNOSIS — Z1211 Encounter for screening for malignant neoplasm of colon: Secondary | ICD-10-CM

## 2024-04-11 DIAGNOSIS — E782 Mixed hyperlipidemia: Secondary | ICD-10-CM

## 2024-04-11 DIAGNOSIS — I10 Essential (primary) hypertension: Secondary | ICD-10-CM

## 2024-04-11 DIAGNOSIS — E559 Vitamin D deficiency, unspecified: Secondary | ICD-10-CM

## 2024-04-11 DIAGNOSIS — R7303 Prediabetes: Secondary | ICD-10-CM

## 2024-04-11 NOTE — Progress Notes (Unsigned)
 Drew labs that were ordered on 04/07/24

## 2024-04-12 LAB — CMP14+EGFR
ALT: 18 IU/L (ref 0–32)
AST: 20 IU/L (ref 0–40)
Albumin: 4.5 g/dL (ref 3.9–4.9)
Alkaline Phosphatase: 54 IU/L (ref 49–135)
BUN/Creatinine Ratio: 17 (ref 12–28)
BUN: 15 mg/dL (ref 8–27)
Bilirubin Total: 0.6 mg/dL (ref 0.0–1.2)
CO2: 24 mmol/L (ref 20–29)
Calcium: 9.7 mg/dL (ref 8.7–10.3)
Chloride: 100 mmol/L (ref 96–106)
Creatinine, Ser: 0.88 mg/dL (ref 0.57–1.00)
Globulin, Total: 3 g/dL (ref 1.5–4.5)
Glucose: 95 mg/dL (ref 70–99)
Potassium: 3.7 mmol/L (ref 3.5–5.2)
Sodium: 141 mmol/L (ref 134–144)
Total Protein: 7.5 g/dL (ref 6.0–8.5)
eGFR: 72 mL/min/1.73

## 2024-04-12 LAB — LIPID PANEL

## 2024-04-12 LAB — CBC WITH DIFFERENTIAL/PLATELET
Basophils Absolute: 0 x10E3/uL (ref 0.0–0.2)
Basos: 1 %
EOS (ABSOLUTE): 0.2 x10E3/uL (ref 0.0–0.4)
Eos: 5 %
Hematocrit: 39.4 % (ref 34.0–46.6)
Hemoglobin: 13.1 g/dL (ref 11.1–15.9)
Immature Grans (Abs): 0 x10E3/uL (ref 0.0–0.1)
Immature Granulocytes: 0 %
Lymphocytes Absolute: 1.6 x10E3/uL (ref 0.7–3.1)
Lymphs: 49 %
MCH: 30.4 pg (ref 26.6–33.0)
MCHC: 33.2 g/dL (ref 31.5–35.7)
MCV: 91 fL (ref 79–97)
Monocytes Absolute: 0.4 x10E3/uL (ref 0.1–0.9)
Monocytes: 10 %
Neutrophils Absolute: 1.2 x10E3/uL — ABNORMAL LOW (ref 1.4–7.0)
Neutrophils: 35 %
Platelets: 214 x10E3/uL (ref 150–450)
RBC: 4.31 x10E6/uL (ref 3.77–5.28)
RDW: 12.6 % (ref 11.7–15.4)
WBC: 3.4 x10E3/uL (ref 3.4–10.8)

## 2024-04-12 LAB — MICROALBUMIN / CREATININE URINE RATIO
Creatinine, Urine: 130.4 mg/dL
Microalb/Creat Ratio: 3 mg/g{creat} (ref 0–29)
Microalbumin, Urine: 3.5 ug/mL

## 2024-04-12 LAB — HEMOGLOBIN A1C

## 2024-04-12 LAB — VITAMIN D 25 HYDROXY (VIT D DEFICIENCY, FRACTURES)

## 2024-04-13 LAB — LIPID PANEL

## 2024-04-14 ENCOUNTER — Encounter (INDEPENDENT_AMBULATORY_CARE_PROVIDER_SITE_OTHER): Payer: Self-pay | Admitting: Primary Care

## 2024-04-14 LAB — SPECIMEN STATUS REPORT

## 2024-04-14 LAB — LIPID PANEL
Cholesterol, Total: 231 mg/dL — AB (ref 100–199)
HDL: 58 mg/dL
LDL CALC COMMENT:: 4 ratio (ref 0.0–4.4)
LDL Chol Calc (NIH): 150 mg/dL — AB (ref 0–99)
Triglycerides: 130 mg/dL (ref 0–149)
VLDL Cholesterol Cal: 23 mg/dL (ref 5–40)

## 2024-04-14 LAB — VITAMIN D 25 HYDROXY (VIT D DEFICIENCY, FRACTURES): Vit D, 25-Hydroxy: 56.6 ng/mL (ref 30.0–100.0)

## 2024-04-14 LAB — HEMOGLOBIN A1C
Est. average glucose Bld gHb Est-mCnc: 126 mg/dL
Hgb A1c MFr Bld: 6 % — AB (ref 4.8–5.6)

## 2024-04-15 ENCOUNTER — Ambulatory Visit (INDEPENDENT_AMBULATORY_CARE_PROVIDER_SITE_OTHER): Payer: Self-pay | Admitting: Primary Care

## 2024-04-16 ENCOUNTER — Other Ambulatory Visit (INDEPENDENT_AMBULATORY_CARE_PROVIDER_SITE_OTHER): Payer: Self-pay

## 2024-04-16 ENCOUNTER — Other Ambulatory Visit: Payer: Self-pay

## 2024-04-16 DIAGNOSIS — E782 Mixed hyperlipidemia: Secondary | ICD-10-CM

## 2024-04-16 DIAGNOSIS — Z1211 Encounter for screening for malignant neoplasm of colon: Secondary | ICD-10-CM

## 2024-04-16 MED ORDER — ATORVASTATIN CALCIUM 40 MG PO TABS
40.0000 mg | ORAL_TABLET | Freq: Every day | ORAL | 1 refills | Status: AC
  Filled 2024-04-16: qty 90, 90d supply, fill #0

## 2024-04-17 ENCOUNTER — Other Ambulatory Visit: Payer: Self-pay

## 2024-04-18 ENCOUNTER — Other Ambulatory Visit: Payer: Self-pay

## 2024-04-23 ENCOUNTER — Other Ambulatory Visit: Payer: Self-pay | Admitting: Primary Care

## 2024-04-23 ENCOUNTER — Other Ambulatory Visit: Payer: Self-pay

## 2024-04-23 DIAGNOSIS — Z1231 Encounter for screening mammogram for malignant neoplasm of breast: Secondary | ICD-10-CM

## 2024-04-24 ENCOUNTER — Other Ambulatory Visit: Payer: Self-pay

## 2024-05-08 LAB — COLOGUARD: COLOGUARD: NEGATIVE

## 2024-05-19 ENCOUNTER — Ambulatory Visit

## 2024-10-06 ENCOUNTER — Ambulatory Visit (INDEPENDENT_AMBULATORY_CARE_PROVIDER_SITE_OTHER): Payer: Self-pay | Admitting: Primary Care
